# Patient Record
Sex: Female | Born: 1975
Health system: Southern US, Community
[De-identification: ages and names within clinical notes are randomized; demographics above are authoritative.]

## PROBLEM LIST (undated history)

## (undated) DIAGNOSIS — D649 Anemia, unspecified: Secondary | ICD-10-CM

## (undated) DIAGNOSIS — G35 Multiple sclerosis: Secondary | ICD-10-CM

## (undated) DIAGNOSIS — R519 Headache, unspecified: Secondary | ICD-10-CM

## (undated) DIAGNOSIS — G709 Myoneural disorder, unspecified: Secondary | ICD-10-CM

## (undated) DIAGNOSIS — H539 Unspecified visual disturbance: Secondary | ICD-10-CM

## (undated) DIAGNOSIS — R51 Headache: Secondary | ICD-10-CM

## (undated) DIAGNOSIS — F32A Depression, unspecified: Secondary | ICD-10-CM

## (undated) DIAGNOSIS — Z9889 Other specified postprocedural states: Secondary | ICD-10-CM

## (undated) DIAGNOSIS — R112 Nausea with vomiting, unspecified: Secondary | ICD-10-CM

## (undated) DIAGNOSIS — E119 Type 2 diabetes mellitus without complications: Secondary | ICD-10-CM

## (undated) DIAGNOSIS — G35D Multiple sclerosis, unspecified: Secondary | ICD-10-CM

## (undated) DIAGNOSIS — F329 Major depressive disorder, single episode, unspecified: Secondary | ICD-10-CM

## (undated) DIAGNOSIS — K219 Gastro-esophageal reflux disease without esophagitis: Secondary | ICD-10-CM

## (undated) DIAGNOSIS — G629 Polyneuropathy, unspecified: Secondary | ICD-10-CM

## (undated) DIAGNOSIS — I10 Essential (primary) hypertension: Secondary | ICD-10-CM

## (undated) HISTORY — PX: TONSILLECTOMY: SUR1361

## (undated) HISTORY — PX: CHOLECYSTECTOMY: SHX55

## (undated) HISTORY — DX: Multiple sclerosis: G35

## (undated) HISTORY — PX: TUBAL LIGATION: SHX77

## (undated) HISTORY — DX: Essential (primary) hypertension: I10

## (undated) HISTORY — DX: Polyneuropathy, unspecified: G62.9

## (undated) HISTORY — DX: Headache, unspecified: R51.9

## (undated) HISTORY — DX: Headache: R51

## (undated) HISTORY — DX: Type 2 diabetes mellitus without complications: E11.9

## (undated) HISTORY — PX: OTHER SURGICAL HISTORY: SHX169

## (undated) HISTORY — DX: Multiple sclerosis, unspecified: G35.D

## (undated) HISTORY — PX: DENTAL SURGERY: SHX609

## (undated) HISTORY — PX: ABDOMINAL HYSTERECTOMY: SHX81

## (undated) HISTORY — DX: Unspecified visual disturbance: H53.9

---

## 1998-09-08 ENCOUNTER — Encounter: Admission: RE | Admit: 1998-09-08 | Discharge: 1998-12-07 | Payer: Self-pay | Admitting: Obstetrics & Gynecology

## 2000-03-29 ENCOUNTER — Emergency Department (HOSPITAL_COMMUNITY): Admission: EM | Admit: 2000-03-29 | Discharge: 2000-03-29 | Payer: Self-pay

## 2010-10-29 ENCOUNTER — Emergency Department (HOSPITAL_COMMUNITY): Admission: EM | Admit: 2010-10-29 | Discharge: 2010-10-29 | Payer: Self-pay | Admitting: Emergency Medicine

## 2011-03-07 LAB — CBC
MCH: 27.1 pg (ref 26.0–34.0)
MCHC: 32.5 g/dL (ref 30.0–36.0)
Platelets: 184 10*3/uL (ref 150–400)
RBC: 4.09 MIL/uL (ref 3.87–5.11)

## 2011-03-07 LAB — POCT I-STAT, CHEM 8
HCT: 36 % (ref 36.0–46.0)
Hemoglobin: 12.2 g/dL (ref 12.0–15.0)
Potassium: 3.2 mEq/L — ABNORMAL LOW (ref 3.5–5.1)
Sodium: 141 mEq/L (ref 135–145)

## 2011-03-07 LAB — DIFFERENTIAL
Basophils Relative: 0 % (ref 0–1)
Eosinophils Absolute: 0 10*3/uL (ref 0.0–0.7)
Neutrophils Relative %: 52 % (ref 43–77)

## 2011-03-07 LAB — GLUCOSE, CAPILLARY: Glucose-Capillary: 171 mg/dL — ABNORMAL HIGH (ref 70–99)

## 2013-05-31 DIAGNOSIS — R209 Unspecified disturbances of skin sensation: Secondary | ICD-10-CM | POA: Insufficient documentation

## 2013-05-31 DIAGNOSIS — N899 Noninflammatory disorder of vagina, unspecified: Secondary | ICD-10-CM | POA: Insufficient documentation

## 2013-05-31 DIAGNOSIS — K089 Disorder of teeth and supporting structures, unspecified: Secondary | ICD-10-CM | POA: Insufficient documentation

## 2013-05-31 DIAGNOSIS — G609 Hereditary and idiopathic neuropathy, unspecified: Secondary | ICD-10-CM | POA: Insufficient documentation

## 2013-05-31 DIAGNOSIS — R059 Cough, unspecified: Secondary | ICD-10-CM | POA: Insufficient documentation

## 2013-05-31 DIAGNOSIS — J209 Acute bronchitis, unspecified: Secondary | ICD-10-CM | POA: Insufficient documentation

## 2013-05-31 DIAGNOSIS — R55 Syncope and collapse: Secondary | ICD-10-CM | POA: Insufficient documentation

## 2013-05-31 DIAGNOSIS — M779 Enthesopathy, unspecified: Secondary | ICD-10-CM | POA: Insufficient documentation

## 2013-05-31 DIAGNOSIS — T783XXA Angioneurotic edema, initial encounter: Secondary | ICD-10-CM | POA: Insufficient documentation

## 2013-05-31 DIAGNOSIS — R112 Nausea with vomiting, unspecified: Secondary | ICD-10-CM | POA: Insufficient documentation

## 2015-01-07 ENCOUNTER — Encounter: Payer: Self-pay | Admitting: Neurology

## 2015-01-07 ENCOUNTER — Ambulatory Visit (INDEPENDENT_AMBULATORY_CARE_PROVIDER_SITE_OTHER): Payer: Medicaid Other | Admitting: Neurology

## 2015-01-07 VITALS — BP 148/96 | Ht 72.0 in | Wt 253.0 lb

## 2015-01-07 DIAGNOSIS — R5382 Chronic fatigue, unspecified: Secondary | ICD-10-CM

## 2015-01-07 DIAGNOSIS — F331 Major depressive disorder, recurrent, moderate: Secondary | ICD-10-CM | POA: Insufficient documentation

## 2015-01-07 DIAGNOSIS — R269 Unspecified abnormalities of gait and mobility: Secondary | ICD-10-CM

## 2015-01-07 DIAGNOSIS — M5416 Radiculopathy, lumbar region: Secondary | ICD-10-CM

## 2015-01-07 DIAGNOSIS — G9332 Myalgic encephalomyelitis/chronic fatigue syndrome: Secondary | ICD-10-CM | POA: Insufficient documentation

## 2015-01-07 DIAGNOSIS — E119 Type 2 diabetes mellitus without complications: Secondary | ICD-10-CM | POA: Insufficient documentation

## 2015-01-07 DIAGNOSIS — G35 Multiple sclerosis: Secondary | ICD-10-CM

## 2015-01-07 DIAGNOSIS — D8989 Other specified disorders involving the immune mechanism, not elsewhere classified: Secondary | ICD-10-CM | POA: Insufficient documentation

## 2015-01-07 NOTE — Progress Notes (Addendum)
[facility]  PATIENT: Angela Solomon DOB: 1976-11-08  REFERRING CLINICIAN: Dr. Rita Ohara  HISTORY FROM: patient REASON FOR VISIT: MS   HISTORICAL  CHIEF COMPLAINT:  Chief Complaint  Patient presents with  . Multiple Sclerosis    HISTORY OF PRESENT ILLNESS:  Angela Solomon is a 39 year old woman with relapsing remitting multiple sclerosis.   She presented in 2003 with gait ataxia, slurred speech and right sided paresthesias. Imaging studies were performed and she was diagnosed with multiple sclerosis. Initially, she was placed on Rebif but had skin reactions and switched to Copaxone. On Copaxone, she had an immediate hypersensitivity response and it was discontinued. She has been on Avonex most of the time but has been off for months at a time when she has had insurance difficulties. She is currently taking Avonex and is able to get it without difficulty  She has many symptoms from her MS. She has gait ataxia and mild right greater than left leg weakness. She also has dysesthesias, fatigue and insomnia. She has depression. Unrelated to the MS, she also has low back and leg pain due to a herniated disc towards the left at L4-L5. 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  She has difficulty with her gait. Her right leg has more clumsiness and some weakness. She also has some altered sensation. She follows a few times every week. When she works, as she needs to assist others, she is unable to use a walker.  She has painful dysesthesias going down her right leg.  She has a lot of difficulty with chronic fatigue from the MS. This usually worsens as the day goes on. She reports both physical and cognitive fatigue. He also notes some depression discussed that may be contributing.    She was having a lot of insomnia but that improved after taking Valium at bedtime.  She has right greater than left lower back pain down the right leg. Her MRI  actually shows that a herniated disc at L4-L5 is more to the left but there is still some lateral recess narrowing on the right that could affect the right L5 nerve root. This pain is worse when she moves around. She gets some benefit from hydrocodone.  She has worked as a Magazine features editor for many years. However, this is becoming extremely difficult for her to do and she has had to miss many days of work. Specifically she has difficulties with her gait and is unable to help patients physically the way that she used to. Furthermore she has some cognitive issues and severe fatigue and that prevents her from working full shifts. These issues have been going on for several years but have continued to progress.  I personally reviewed office notes from [facility] and reports and images (when available) of: 1. MRI of the cervical spine dated 05/20/2014. It showed enhancing spinal cord lesions consistent with MS. C2-C3 there was a left lateral focus. Also at C2-C3 there was a posterior focus. At C3-C4 there was a right posterior lateral focus. At C4 there was a dorsal focus and at C5 there was another dorsal focus. 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 There are multilevel degenerative changes most pronounced at C7-T1 where there could be dynamic impingement of the right C8 nerve root.  2. MRI of the brain dated 09/28/2013 showing diffuse white matter foci in the periventricular and deep white matter consistent with [place].  Although there were no enhancing  foci, when compared to a previous MRI dated 10/11/2010, there was some progression.  3. An MRI of the lumbar spine dated 02/02/2012 showed a large disc extrusion towards the left at L4-L5 that could lead to left L4 and left L5 nerve root compression. 4. MRI of the brain with and without contrast dated 10/16/2008 showed enhancing lesions.  She was reportedly off medication at that time.   REVIEW OF SYSTEMS:  Constitutional: No fevers, chills,  sweats, or change in appetite Eyes: No visual changes, double vision, eye pain Ear, nose and throat: No hearing loss, ear pain, nasal congestion, sore throat Cardiovascular: No chest pain, palpitations Respiratory:  No shortness of breath at rest or with exertion.   No wheezes GastrointestinaI: No nausea, vomiting, diarrhea, abdominal pain, fecal incontinence Genitourinary:  No dysuria, urinary retention or frequency.  No nocturia. Musculoskeletal:  No neck pain, back pain Integumentary: No rash, pruritus, skin lesions Neurological: as above Psychiatric: No depression at this time.  No anxiety Endocrine: No palpitations, diaphoresis, change in appetite, change in weigh or increased thirst Hematologic/Lymphatic:  No anemia, purpura, petechiae. Allergic/Immunologic: No itchy/runny eyes, nasal congestion, recent allergic reactions, rashes  ALLERGIES: Allergies  Allergen Reactions  . Glatiramer Anaphylaxis  . Lisinopril Anaphylaxis  . Aspirin Nausea And Vomiting    Fevers and GI upset  . Hydrocodone-Acetaminophen Nausea And Vomiting    Pt states can take Hydrocodone but has a reaction to a additive in vicodin    HOME MEDICATIONS: No outpatient prescriptions prior to visit.   No facility-administered medications prior to visit.    PAST MEDICAL HISTORY: Past Medical History  Diagnosis Date  . Multiple sclerosis   . Diabetes mellitus without complication   . Headache   . Hypertension   . Neuropathy   . Vision abnormalities     PAST SURGICAL HISTORY: Past Surgical History  Procedure Laterality Date  . Tonsillectomy    . Cholecystectomy    . Dental surgery      FAMILY HISTORY: Family History  Problem Relation Age of Onset  . Hypertension Mother   . Diabetes Mother   . Stroke Mother   . Heart attack Mother   . Peripheral vascular disease Mother     SOCIAL HISTORY:  History   Social History  . Marital Status: Single    Spouse Name: N/A    Number of Children:  N/A  . Years of Education: N/A   Occupational History  . Not on file.   Social History Main Topics  . Smoking status: Current Every Day Smoker -- 0.50 packs/day    Types: Cigarettes  . Smokeless tobacco: Not on file  . Alcohol Use: 0.0 oz/week    0 Not specified per week     Comment: occasional  . Drug Use: No  . Sexual Activity: Not on file   Other Topics Concern  . Not on file   Social History Narrative  . No narrative on file     PHYSICAL EXAM  Filed Vitals:   01/07/15 0913  BP: 148/96  Height: 6' (1.829 m)  Weight: 253 lb (114.76 kg)    Body mass index is 34.31 kg/(m^2).   General: The patient is well-developed and well-nourished and in no acute distress  Eyes:  Funduscopic exam shows normal optic discs and retinal vessels.  Neck/back: The neck is supple, no carotid bruits are noted.  The neck is mildly tender.  Lower back is tender more to the right with restricted ROM  Respiratory: The  respiratory examination is clear.  Cardiovascular: The cardiovascular examination reveals a regular rate and rhythm, no murmurs, gallops or rubs are noted.  Skin: Extremities are without significant edema.     Neurologic Exam  Mental status: The patient is alert and oriented x 3 at the time of the examination. The patient has apparent normal recent and remote memory, with an apparently normal attention span and concentration ability.   Speech is normal.  Cranial nerves: Extraocular movements are full. Pupils are equal, round, and reactive to light and accomodation.  Visual fields are full.  Facial symmetry is present. There is good facial sensation to soft touch bilaterally.Facial strength is normal.  Trapezius and sternocleidomastoid strength is normal. No dysarthria is noted.  The tongue is midline, and the patient has symmetric elevation of the soft palate. No obvious hearing deficits are noted.  Motor:  Muscle bulk and tone are normal. Strength is  5 / 5 in the arms but  she has 4/5 left EHL and 4+/5 right EHL strength  Sensory: Sensory testing shpws decreased touch on the left and decreased vibration in right leg.      Coordination: Cerebellar testing reveals good finger-nose-finger but poor heel-to-shin bilaterally.  Gait and station: Station is wide.   Positive Romberg.   Wide gait and tandem is very poor.   Reflexes: Deep tendon reflexes are symmetric and 1 -2 bilaterally. Plantar responses are normal.    DIAGNOSTIC DATA (LABS, IMAGING, TESTING) - I reviewed patient records, labs, notes, testing and imaging myself where available.  Lab Results  Component Value Date   WBC 7.4 WHITE COUNT CONFIRMED ON SMEAR 10/29/2010   HGB 12.2 10/29/2010   HCT 36.0 10/29/2010   MCV 83.6 10/29/2010   PLT 184 PLATELET COUNT CONFIRMED BY SMEAR 10/29/2010      Component Value Date/Time   NA 141 10/29/2010 0251   K 3.2* 10/29/2010 0251   CL 105 10/29/2010 0251   GLUCOSE 184* 10/29/2010 0251   BUN 8 10/29/2010 0251   CREATININE 0.6 10/29/2010 0251   No results found for: CHOL, HDL, LDLCALC, LDLDIRECT, TRIG, CHOLHDL No results found for: HGBA1C No results found for: VITAMINB12 No results found for: TSH No components found for: VITAMIND     ASSESSMENT AND PLAN  In summary, Artina Minella is a 39 year old woman with multiple sclerosis since 2003 has had progressive gait difficulties over the past few years.  She has had some difficulty with touching her medications over the years. She usually appears stable if she stays on her Avonex. Exacerbations have occurred at least twice when she was off her medications. She will continue to take the medication and call us as soon as possible if she has an interruption in her supply.   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  Her fatigue only worsens the matter. She will continue on her medications.  Later this year we will check another MRI of the brain with and without contrast to ensure that there has not been progression of disease. If that has occurred, we would need to consider a change in therapy. She is advised to use her walker for balance.  If low back pain worsens, consider an epidural steroid injection.  She will return to see me in 3-4 months, sooner if she has new or worsening symptoms.   Payzlee Ryder A. Felecia Shelling, MD, PhD  3/42/8768, 1:15 AM Certified in Neurology, Clinical Neurophysiology, Sleep Medicine, Pain Medicine and Neuroimaging  [facility] [address], [place], [place] [address] [phone]

## 2015-01-07 NOTE — Patient Instructions (Signed)

## 2015-02-01 ENCOUNTER — Other Ambulatory Visit: Payer: Self-pay | Admitting: Neurology

## 2015-02-01 NOTE — Telephone Encounter (Addendum)
Pt is calling wanting a Rx that was given to her for the pain in her mouth.  She does not remember the medication but she thinks it may be Esomeprazole magnesium 40 mg. The medicine he gave her controls the nerves in her mouth where she does not have teeth.  She would like for Faith to give her a call back.

## 2015-02-01 NOTE — Telephone Encounter (Signed)
Did she leave an up to date #?  None of the #'s in her chart work/fim

## 2015-02-09 NOTE — Telephone Encounter (Signed)
Patient works 3rd shift and wants to be called for pick-up at a reasonable time.

## 2015-02-10 ENCOUNTER — Telehealth: Payer: Self-pay | Admitting: Neurology

## 2015-02-10 ENCOUNTER — Other Ambulatory Visit: Payer: Self-pay | Admitting: Neurology

## 2015-02-10 DIAGNOSIS — G35 Multiple sclerosis: Secondary | ICD-10-CM

## 2015-02-10 MED ORDER — HYDROCODONE-ACETAMINOPHEN 10-325 MG PO TABS: 1.0000 | ORAL_TABLET | Freq: Three times a day (TID) | ORAL | Status: DC | PRN

## 2015-02-10 NOTE — Telephone Encounter (Signed)
Hydrocodone rx. printed, signed, up front [facility] for pt. to pick up.  She is aware and will pick it up when the office opens tomorrow/fim

## 2015-02-10 NOTE — Telephone Encounter (Signed)
Patient requesting refill of hydrocodone 10/325. Best number to reach her is [phone].

## 2015-02-10 NOTE — Telephone Encounter (Signed)
Request entered, forwarded to provider for approval.  

## 2015-02-10 NOTE — Telephone Encounter (Signed)
Spoke with Laible has mult issues, which are more chronic/progressive in nature, such as worsening gait/balance, pain in legs.  Sts. she is also  having dental issus and is going to have to have all of her upper teeth removed.  Will eval her back for her reg f/u appt.  She requested r/f of Hydrocodone.  It is due, so it was printed, signed, and is up front [place] for her to pick up when the office opens tomorrow am/fim

## 2015-02-10 NOTE — Telephone Encounter (Signed)
Patient came to the office today complaining of pain in her lower right leg. The pain has increased. When she bends over, cough or sneezes it feels like her pelvis shifts. She has a burning feeling and muscles are very tight. She states that she has been falling a lot.  She would like a call back. Her best number is  [phone].

## 2015-02-11 ENCOUNTER — Telehealth: Payer: Self-pay | Admitting: Neurology

## 2015-02-11 NOTE — Telephone Encounter (Signed)
I spoke with Angela Solomon has decided to file for disability.  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  Dr. Felecia Shelling has spoken with her about this in the past and is agreeable that she is truly disabled.  I advised Angela Solomon to have disability paperwork faxed to our office fax # [phone], and Dr. Felecia Shelling will complete this for her.  She verbalized understanding of same/fim

## 2015-02-11 NOTE — Telephone Encounter (Signed)
Angela Solomon picked up her rx this am./fim

## 2015-02-11 NOTE — Telephone Encounter (Signed)
Patient states that she worked last night and does not feel that she can do it any longer. She would like an earlier apt if she can. Her best call back is [phone].

## 2015-03-03 ENCOUNTER — Telehealth: Payer: Self-pay | Admitting: Neurology

## 2015-03-03 ENCOUNTER — Other Ambulatory Visit: Payer: Self-pay | Admitting: *Deleted

## 2015-03-03 MED ORDER — DIAZEPAM 10 MG PO TABS: 10.0000 mg | ORAL_TABLET | Freq: Every evening | ORAL | Status: DC | PRN

## 2015-03-03 NOTE — Telephone Encounter (Signed)
Per note, Karson is taking Valium qhs prn.  Rx. printed, signed, in [place] office to go up front for Jaretssi to pick up/fim

## 2015-03-03 NOTE — Telephone Encounter (Signed)
Patient is calling to get a written Rx Diazepam 10 mg. Please leave message on her phone.

## 2015-03-08 ENCOUNTER — Other Ambulatory Visit: Payer: Self-pay | Admitting: Neurology

## 2015-03-08 ENCOUNTER — Telehealth: Payer: Self-pay

## 2015-03-08 DIAGNOSIS — G35 Multiple sclerosis: Secondary | ICD-10-CM

## 2015-03-08 MED ORDER — HYDROCODONE-ACETAMINOPHEN 10-325 MG PO TABS: 1.0000 | ORAL_TABLET | Freq: Three times a day (TID) | ORAL | Status: DC | PRN

## 2015-03-08 NOTE — Telephone Encounter (Signed)
Patient requesting refill for Rx HYDROcodone-acetaminophen (NORCO) 10-325 MG per tablet.  Please call when ready for pick up. °

## 2015-03-08 NOTE — Telephone Encounter (Signed)
Called patient and informed Rx ready for pick up at front desk. Patient verbalized understanding.  

## 2015-03-18 ENCOUNTER — Telehealth: Payer: Self-pay | Admitting: Neurology

## 2015-03-18 DIAGNOSIS — M545 Low back pain: Secondary | ICD-10-CM

## 2015-03-18 DIAGNOSIS — G8929 Other chronic pain: Secondary | ICD-10-CM | POA: Insufficient documentation

## 2015-03-18 MED ORDER — METHYLPREDNISOLONE (PAK) 4 MG PO TABS: ORAL_TABLET | ORAL | Status: DC

## 2015-03-18 NOTE — Telephone Encounter (Signed)
LMTC./fim 

## 2015-03-18 NOTE — Telephone Encounter (Signed)
Patient was called back and she stated that she had recent lower back pain which worsening for the last several days and she was not able to stand up. She was so painful and she needs steroids to help her. She is stated that she is a patient with of Dr. Felecia Shelling for 13 years, and she was used to be prescribed of Medrol Dosepak for lower back pain. Dr. Felecia Shelling saw her 01/07/2015 and stated in his note that if lower back pain worsens, he suggest epidural steroid injection. However patient stated that she had last epidural injection was when she had her baby and she would not allow anybody to stick her in her back. Will prescribed a Medrol Dosepak to her pharmacy.  She also had UTI which was recently diagnosed and she was put on antibiotics. She had already taken anybody for the last 8 days and there is 2 more days to go. Her symptoms much improved. I informed her that steroids may worsen the bacterial infection and if there's any worsening of UTI symptoms she needs to stop steroids and go to a nearby emergency room. She expressed unstanding and appreciation.  Rosalin Hawking, MD PhD Stroke Neurology 03/18/2015 1:16 PM

## 2015-03-18 NOTE — Telephone Encounter (Signed)
Patient is calling to get a Rx called in for Prednisone. Patient states the pain in her lower right side of her back is now chronic and the patient cannot stand up. Please call and advise. [facility]  [address] in [place]. Patient also states she went to the ER on 3-22 and was diagnosed with a chronic UTI and was given an antibiotic. Please call patient to discuss.Thank you.

## 2015-03-22 NOTE — Telephone Encounter (Signed)
Spoke with Angela Solomon who sts. right sided lbp is no better, but that she just started Medrol dose pk this am.  In light of her recent tx. for uti, we reviewed sx. of uti (dark urine, strong odor, dysuria) and she is aware if she has any of these sx., she should follow up with her pcp.  She will call back next week if back pain is not improving or is worse/fim

## 2015-03-28 ENCOUNTER — Telehealth: Payer: Self-pay | Admitting: Neurology

## 2015-03-28 NOTE — Telephone Encounter (Signed)
She called in more pain.   I called in baclofen up to 10 mg po qid  If not better, she will call us back tomorrow to be worked in.

## 2015-03-29 ENCOUNTER — Telehealth: Payer: Self-pay | Admitting: Neurology

## 2015-03-29 NOTE — Telephone Encounter (Signed)
Noted.  Will discuss at f/u appt. tomorrow/fim

## 2015-03-29 NOTE — Telephone Encounter (Addendum)
Patient is calling back in regard to Rx Baclofen you prescribed for her yesterday. She states drug is not helping her and is coming in tomorrow 4/5 for appt.  Thanks!

## 2015-03-30 ENCOUNTER — Encounter: Payer: Self-pay | Admitting: Neurology

## 2015-03-30 ENCOUNTER — Telehealth: Payer: Self-pay | Admitting: Neurology

## 2015-03-30 ENCOUNTER — Ambulatory Visit (INDEPENDENT_AMBULATORY_CARE_PROVIDER_SITE_OTHER): Payer: Medicaid Other | Admitting: Neurology

## 2015-03-30 VITALS — BP 142/94 | HR 80 | Resp 16 | Ht 72.0 in | Wt 253.0 lb

## 2015-03-30 DIAGNOSIS — M5441 Lumbago with sciatica, right side: Secondary | ICD-10-CM

## 2015-03-30 DIAGNOSIS — G35 Multiple sclerosis: Secondary | ICD-10-CM | POA: Diagnosis not present

## 2015-03-30 DIAGNOSIS — M5442 Lumbago with sciatica, left side: Secondary | ICD-10-CM

## 2015-03-30 DIAGNOSIS — M5416 Radiculopathy, lumbar region: Secondary | ICD-10-CM

## 2015-03-30 MED ORDER — PROMETHAZINE HCL 25 MG PO TABS: 25.0000 mg | ORAL_TABLET | Freq: Four times a day (QID) | ORAL | Status: DC | PRN

## 2015-03-30 NOTE — Progress Notes (Signed)
[facility]  PATIENT: Angela Solomon DOB: 11-30-1976  REFERRING CLINICIAN: Dr. Rita Ohara  HISTORY FROM: patient REASON FOR VISIT: MS   HISTORICAL  CHIEF COMPLAINT:  Chief Complaint  Patient presents with  . Back Pain    Sts. is having lbp radiating  down posterior aspect of both legs to feet.  Sts. also has twitching  sensation lateral right calf, like someone is tapping her leg.  Sts. minimal relief with Baclofen./fim    HISTORY OF PRESENT ILLNESS:  Angela Solomon is a 39 year old woman with relapsing remitting multiple sclerosis who reports much worse right back pain that spread to both sides and now is radiating down both legs, down the calf and into the top of both feet.   Pain increases with standing and walking.   Sitting leaning forward is a little better.      A Medrol dose pack and baclofen has  helped some  (helps her in bed but not when she is standing).   An MRI of the lumbar spine dated 02/02/2012 showed a large disc extrusion towards the left at L4-L5 that could lead to left L4 and left L5 nerve root compression.   Her MRI shows that the herniated disc at L4-L5 is more to the left but there is still some lateral recess narrowing on the right that could affect the right L5 nerve root.    She had a bad experience with an ESI in the past and does not want another one.    In the ER last week, she had a UTI and receive PCN.   She is now better.    Unfortunately, she has a yeast infection since the Abx for the UTI (she is a diabetic since age 70)   MS:   She presented in 2003 with gait ataxia, slurred speech and right sided paresthesias. Imaging studies were performed and she was diagnosed with multiple sclerosis. Initially, she was placed on Rebif but had skin reactions and switched to Copaxone. On Copaxone, she had an immediate hypersensitivity response and it was discontinued. She has been on Avonex most of the time but has been off for months at a time when  she has had insurance difficulties. She is currently taking Avonex and is able to get it without difficulty.   She has many symptoms from her MS. She has gait ataxia and mild right greater than left leg weakness. She also has dysesthesias, fatigue and insomnia. She has depression.   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  She has difficulty with her gait. Her right leg has more clumsiness and some weakness. She also has some altered sensation. She follows a few times every week. When she works, as she needs to assist others, she is unable to use a walker.   She has worked as a Magazine features editor for many years. However, this is becoming extremely difficult for her to do and she has had to miss many days of work. Specifically she has difficulties with her gait and is unable to help patients physically the way that she used to. Furthermore she has some cognitive issues and severe fatigue and that prevents her from working full shifts.   Currnetly, with her lumbar radic's, she cannot work  These issues have been going on for several years but have continued to progress.  Latest MRI's: 1. MRI of the cervical spine dated 05/20/2014. It showed enhancing spinal cord lesions consistent with MS. C2-C3 there was  a left lateral focus. Also at C2-C3 there was a posterior focus. At C3-C4 there was a right posterior lateral focus. At C4 there was a dorsal focus and at C5 there was another dorsal focus. 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 There are multilevel degenerative changes most pronounced at C7-T1 where there could be dynamic impingement of the right C8 nerve root.  2. MRI of the brain dated 09/28/2013 showing diffuse white matter foci in the periventricular and deep white matter consistent with [place].  Although there were no enhancing foci, when compared to a previous MRI dated 10/11/2010, there was some progression.  3. An MRI of the lumbar spine dated 02/02/2012 showed a  large disc extrusion towards the left at L4-L5 that could lead to left L4 and left L5 nerve root compression. 4. MRI of the brain with and without contrast dated 10/16/2008 showed enhancing lesions.  She was reportedly off medication at that time.   REVIEW OF SYSTEMS:  Constitutional: No fevers, chills, sweats, or change in appetite.   Fatigue, poor sleep Eyes: No visual changes, double vision, eye pain Ear, nose and throat: No hearing loss, ear pain, nasal congestion, sore throat Cardiovascular: No chest pain, palpitations Respiratory:  No shortness of breath at rest or with exertion.   No wheezes GastrointestinaI: No nausea, vomiting, diarrhea, abdominal pain, fecal incontinence Genitourinary:  No dysuria, urinary retention or frequency.  No nocturia. Musculoskeletal:  as above. Integumentary: No rash, pruritus, skin lesions Neurological: as above Psychiatric: No depression at this time.  No anxiety Endocrine: No palpitations, diaphoresis, change in appetite, change in weigh or increased thirst Hematologic/Lymphatic:  No anemia, purpura, petechiae. Allergic/Immunologic: No itchy/runny eyes, nasal congestion, recent allergic reactions, rashes  ALLERGIES: Allergies  Allergen Reactions  . Glatiramer Anaphylaxis  . Lisinopril Anaphylaxis  . Aspirin Nausea And Vomiting    Fevers and GI upset  . Hydrocodone-Acetaminophen Nausea And Vomiting    Pt states can take Hydrocodone but has a reaction to a additive in vicodin  . Zithromax [Azithromycin] Rash    HOME MEDICATIONS: Outpatient Prescriptions Prior to Visit  Medication Sig Dispense Refill  . amitriptyline (ELAVIL) 25 MG tablet Take by mouth at bedtime.  5  . B-D INS SYR ULTRAFINE 1CC/31G 31G X 5/16" 1 ML MISC See admin instructions.  0  . carvedilol (COREG) 25 MG tablet Take 25 mg by mouth.    . cyclobenzaprine (FLEXERIL) 10 MG tablet Take 10 mg by mouth.    . diazepam (VALIUM) 10 MG tablet Take 1 tablet (10 mg total) by mouth at  bedtime as needed for anxiety. 30 tablet 2  . ferrous sulfate 325 (65 FE) MG tablet Take 325 mg by mouth.    . fluticasone (FLONASE) 50 MCG/ACT nasal spray 2 sprays by Each Nare route daily.    Marland Kitchen gabapentin (NEURONTIN) 400 MG capsule Take 800 mg by mouth.    Marland Kitchen HYDROcodone-acetaminophen (NORCO) 10-325 MG per tablet Take 1 tablet by mouth 3 (three) times daily as needed. 90 tablet 0  . insulin NPH-regular Human (NOVOLIN 70/30) (70-30) 100 UNIT/ML injection Inject into the skin.    Marland Kitchen LEVEMIR 100 UNIT/ML injection   5  . losartan-hydrochlorothiazide (HYZAAR) 100-25 MG per tablet Take by mouth.    Marland Kitchen NOVOLIN R 100 UNIT/ML injection See admin instructions. per sliding scale  0  . pantoprazole (PROTONIX) 40 MG tablet Take 40 mg by mouth.    . promethazine (PHENERGAN) 25 MG tablet Take 25 mg by  mouth.    . citalopram (CELEXA) 20 MG tablet Take 20 mg by mouth.    . fluconazole (DIFLUCAN) 200 MG tablet   1  . methylPREDNIsolone (MEDROL DOSPACK) 4 MG tablet follow package directions (Patient not taking: Reported on 03/30/2015) 21 tablet 0  . Sodium Fluoride 1.1 % PSTE Brush normally. Rinse. Apply small pea-size of Prevident and brush all surfaces. Spit. Do not rinse/drink for 92min.     No facility-administered medications prior to visit.    PAST MEDICAL HISTORY: Past Medical History  Diagnosis Date  . Multiple sclerosis   . Diabetes mellitus without complication   . Headache   . Hypertension   . Neuropathy   . Vision abnormalities     PAST SURGICAL HISTORY: Past Surgical History  Procedure Laterality Date  . Tonsillectomy    . Cholecystectomy    . Dental surgery      FAMILY HISTORY: Family History  Problem Relation Age of Onset  . Hypertension Mother   . Diabetes Mother   . Stroke Mother   . Heart attack Mother   . Peripheral vascular disease Mother     SOCIAL HISTORY:  History   Social History  . Marital Status: Single    Spouse Name: N/A  . Number of Children: N/A  .  Years of Education: N/A   Occupational History  . Not on file.   Social History Main Topics  . Smoking status: Current Every Day Smoker -- 0.50 packs/day    Types: Cigarettes  . Smokeless tobacco: Not on file  . Alcohol Use: 0.0 oz/week    0 Standard drinks or equivalent per week     Comment: occasional  . Drug Use: No  . Sexual Activity: Not on file   Other Topics Concern  . Not on file   Social History Narrative     PHYSICAL EXAM  Filed Vitals:   03/30/15 0936  BP: 142/94  Pulse: 80  Resp: 16  Height: 6' (1.829 m)  Weight: 253 lb (114.76 kg)    Body mass index is 34.31 kg/(m^2).   General: The patient is well-developed and well-nourished and in no acute distress  Neck/back:  The neck is mildly tender.  Lower back is tender more to the right in lower lumbar paraspinals with restricted ROM  Neurologic Exam  Mental status: The patient is alert and oriented x 3 at the time of the examination. The patient has apparent normal recent and remote memory, with an apparently normal attention span and concentration ability.   Speech is normal.  Cranial nerves: Extraocular movements are full. Facial symmetry is present. There is good facial sensation to soft touch bilaterally.Facial strength is normal.  Trapezius and sternocleidomastoid strength is normal. No dysarthria is noted.   Motor:  Muscle bulk and tone are normal. Strength is  5 / 5 in the arms but she has 3/5 left EHL and 4/5 right EHL strength  Sensory: Sensory testing shpws decreased touch on the left leg (L5 worse than S1 dermatome) and decreased vibration in right leg.      Coordination: Cerebellar testing reveals good finger-nose-finger but poor heel-to-shin bilaterally.  Gait and station: Station is wide.   Positive Romberg.   Wide gait and tandem is very poor.   Reflexes: Deep tendon reflexes are symmetric and 1 bilaterally in legs.    DIAGNOSTIC DATA (LABS, IMAGING, TESTING) - I reviewed patient records,  labs, notes, testing and imaging myself where available.     ASSESSMENT AND  PLAN  Lumbar radicular pain - Plan: MR Lumbar Spine Wo Contrast, Ambulatory referral to Physical Therapy  Multiple sclerosis  Bilateral low back pain with sciatica, sciatica laterality unspecified   1.    Check Lumbar-spine MRI and consider ESI or referral to surgery depending on results 2.    Physical therapy for radiculopathy 3.    TPI 80 mg Depo-Medrol in Marcaine using sterile technique -- pain was 20-30 % better afterwards 4.    Continue med's 5.    rtc 3 months if better or sooner if not better. Richard A. Felecia Shelling, MD, PhD 01/27/3434, 68:61 AM Certified in Neurology, Clinical Neurophysiology, Sleep Medicine, Pain Medicine and Neuroimaging  [facility] [address] [place], [place] [address] [phone]

## 2015-03-30 NOTE — Telephone Encounter (Signed)
LMOM for Angela Solomon--[facility], but she should contact Dr. Shan Levans office for Fluconazole, as he rx's this for her./fim

## 2015-03-30 NOTE — Telephone Encounter (Signed)
Pt is calling requesting a refill on fluconazole (DIFLUCAN) 200 MG tablet and promethazine (PHENERGAN) 25 MG tablet. [facility] [place] [facility] [address].  Please call and advise.

## 2015-03-31 ENCOUNTER — Telehealth: Payer: Self-pay | Admitting: *Deleted

## 2015-03-31 NOTE — Telephone Encounter (Signed)
Release fax to [facility] requesting records on 04/09/15.

## 2015-04-02 ENCOUNTER — Telehealth: Payer: Self-pay | Admitting: Neurology

## 2015-04-02 DIAGNOSIS — G35 Multiple sclerosis: Secondary | ICD-10-CM

## 2015-04-02 MED ORDER — HYDROCODONE-ACETAMINOPHEN 10-325 MG PO TABS: 1.0000 | ORAL_TABLET | Freq: Three times a day (TID) | ORAL | Status: DC | PRN

## 2015-04-02 NOTE — Telephone Encounter (Signed)
Hydrocodone rx. postdated for 04-07-15 due date, printed, signed by [name], up front [facility] to be picked up.  I spoke with Angela Solomon and let her know rx. ready/fim

## 2015-04-02 NOTE — Telephone Encounter (Signed)
Patient is calling to get a written Rx for HYDROcodone-acetaminophen (NORCO) 10-325 MG per tablet. Please call patient when ready for pickup. Thank you.

## 2015-04-06 ENCOUNTER — Telehealth: Payer: Self-pay | Admitting: Neurology

## 2015-04-08 ENCOUNTER — Telehealth: Payer: Self-pay | Admitting: Neurology

## 2015-04-08 MED ORDER — CYCLOBENZAPRINE HCL 10 MG PO TABS: 10.0000 mg | ORAL_TABLET | Freq: Three times a day (TID) | ORAL | Status: DC | PRN

## 2015-04-08 NOTE — Telephone Encounter (Signed)
Patient is calling to get a refill called in for cyclobenzaprine (FLEXERIL) 10 MG tablet [facility] [address] [facility] [facility] and wants to discuss this medication with Faith  ASAP before 5pm. Thank you.

## 2015-04-12 ENCOUNTER — Telehealth: Payer: Self-pay | Admitting: Neurology

## 2015-04-12 NOTE — Telephone Encounter (Signed)
Patient is calling to let you know she is hurting,  can't walk, her balance is off and she has fallen 7 times from Saturday morning until now.  She needs an extended leave for her work.  Please fax letter to [phone]. Also her MRI will be May 3rd. She feels she needs help and would like for you to call her please.  Thanks!

## 2015-04-12 NOTE — Telephone Encounter (Signed)
I spoke with Angela Solomon and advised generally fmla paperwork is required for extended absences from work.  I advised she may have her employer fax me the paperwork, or she can pick it up and bring it to me--whichever is most convenient for her.  She verbalized understanding of same/fim

## 2015-04-14 ENCOUNTER — Encounter: Payer: Self-pay | Admitting: Neurology

## 2015-04-14 NOTE — Telephone Encounter (Signed)
Pt is calling back stating where she works at is a private place and they do not have FMLA paperwork.  She states Dr. Felecia Shelling can write a letter stating she is under his care.  She is still on her walker 100%.  She needs this letter to be faxed to [phone] attn:  Godfrey Pick..  She can give you more details when you call her back to advise. Pt also states she does not feel that Dr. Felecia Shelling is getting all of her messages, I did inform the pt that her message were documented and I read it back to her and she verbalized understanding.  Please call and advise.

## 2015-04-14 NOTE — Telephone Encounter (Signed)
Attempted to contact Teela--received message that vm is full; I am unable to leave a message/fim

## 2015-04-14 NOTE — Telephone Encounter (Signed)
Ill be happy to write a letter and fax to Ms. Britt ---   How much time off does she thinks she needs so I can include in letter.   Can MRI be moved up sooner?

## 2015-04-15 ENCOUNTER — Encounter: Payer: Self-pay | Admitting: Neurology

## 2015-04-15 NOTE — Telephone Encounter (Signed)
Printed and signed.   Please fax to number on letter

## 2015-04-15 NOTE — Telephone Encounter (Signed)
Patient is very angry because she needs the [facility] faxed to [phone] and has been waiting. I told her that Faith RN tried to call her back and her voicemail was full and she requested that we try both her cell and her home number the next time we call. She stated that she is about to lose her job because she has been waiting for this paperwork. Please call and advise. DW

## 2015-04-15 NOTE — Telephone Encounter (Signed)
I have faxed letter to Godfrey Pick, at [phone].  I spoke with Megha and let her know this  has been done.  She verbalized understanding of same, and stated she was sorry for becoming angry in phone conversations with me and with Gaynell.  Sts. she has been under more stress due to husband's health problems./fim

## 2015-04-20 ENCOUNTER — Telehealth: Payer: Self-pay | Admitting: Neurology

## 2015-04-20 ENCOUNTER — Encounter: Payer: Self-pay | Admitting: *Deleted

## 2015-04-20 NOTE — Telephone Encounter (Signed)
Patient returned call, I told her that you were going to fax the information over today and she has stated that she has additional questions regarding her medication. Please call and advise.

## 2015-04-20 NOTE — Telephone Encounter (Signed)
Pt is calling stating that she is having a problem getting a refill on cyclobenzaprine (FLEXERIL) 10 MG tablet because of the baclofen which she is not using anymore. She states she is moving around a lot better now and she wants to go back to work.  So she needs a letter to go back to work on May 1st. You can fax the letter to  [phone]. Please call and advise.

## 2015-04-20 NOTE — Telephone Encounter (Signed)
Left message for Angela Solomon that I will fax letter allowing her to return to work on May 1, to her employer today.  She does not need to return this call unless she has questions/fim

## 2015-04-20 NOTE — Telephone Encounter (Signed)
Spoke with Lanaysia who sts. she is having difficulty getting flexeril rx. from the pharmacy.  They do have rx. with refills there--she isn't sure what the problem is exactly.  She will speak with them and call me back or have the pharmacy call me if they need anything from me/fim

## 2015-04-27 ENCOUNTER — Ambulatory Visit
Admission: RE | Admit: 2015-04-27 | Discharge: 2015-04-27 | Disposition: A | Discharge status: Home / Self Care | Payer: Medicaid Other | Source: Ambulatory Visit | Attending: Neurology | Admitting: Neurology

## 2015-04-27 ENCOUNTER — Encounter (INDEPENDENT_AMBULATORY_CARE_PROVIDER_SITE_OTHER): Payer: Medicaid Other | Admitting: Neurology

## 2015-04-27 DIAGNOSIS — M5416 Radiculopathy, lumbar region: Secondary | ICD-10-CM

## 2015-04-28 ENCOUNTER — Telehealth: Payer: Self-pay | Admitting: *Deleted

## 2015-04-28 NOTE — Telephone Encounter (Signed)
#   busy mult. times today.  I need to speak with Angela Solomon--[name] has read the mri of her back.  She has a disc herniation with a sequestered disc fragment, and should see NS for this.  If she is agreeable, I will order NS referral/fim

## 2015-04-28 NOTE — Telephone Encounter (Signed)
Need to speak with Angela Solomon--[name] has read the mri of her L-S spine.  She has a large  herniated disc with sequestered disc fragment--needs referral to NS/fim

## 2015-04-29 ENCOUNTER — Encounter: Payer: Self-pay | Admitting: *Deleted

## 2015-04-29 NOTE — Telephone Encounter (Signed)
I attempted to contact Falynn again--have been told at work # that there is nobody there by that name.  Mobile/home # I received a message that "at the subscriber's request, this number does not accept incoming calls/fim

## 2015-04-29 NOTE — Telephone Encounter (Signed)
-----   Message from Britt Bottom, MD sent at 04/28/2015 10:56 AM EDT ----- She has a herniated disc at L4L5 woth a sequestered disc fragment compressing several nerve roots.   I need her to see Neurosurgery as medications or injections are unlikely to help enough

## 2015-04-29 NOTE — Telephone Encounter (Signed)
See other 04-28-15 task.  I have made mult. attempts to reach Angela Solomon.  Work # says there is nobody there by that name.  Home # has a message that "at the subscriber's reqeust, this number does not accept incoming calls.  I have mailed an unable to contact letter today, asking her to call me about mri results/fim

## 2015-05-04 ENCOUNTER — Telehealth: Payer: Self-pay | Admitting: Neurology

## 2015-05-04 DIAGNOSIS — G35 Multiple sclerosis: Secondary | ICD-10-CM

## 2015-05-04 MED ORDER — HYDROCODONE-ACETAMINOPHEN 10-325 MG PO TABS: 1.0000 | ORAL_TABLET | Freq: Three times a day (TID) | ORAL | Status: DC | PRN

## 2015-05-04 NOTE — Telephone Encounter (Signed)
Patient called and request a refill on Rx. HYDROcodone-acetaminophen (NORCO) 10-325 MG per tablet. Please call and advise.

## 2015-05-04 NOTE — Telephone Encounter (Signed)
I have spoken with Angela Solomon and advised Hydrocodone rx. postdated for due date of 05-07-15, printed, signed, up front [facility].  She verbalized understanding of same/fim

## 2015-05-05 ENCOUNTER — Other Ambulatory Visit: Payer: Self-pay | Admitting: *Deleted

## 2015-05-05 DIAGNOSIS — M5432 Sciatica, left side: Secondary | ICD-10-CM

## 2015-05-05 DIAGNOSIS — M5416 Radiculopathy, lumbar region: Secondary | ICD-10-CM

## 2015-05-05 DIAGNOSIS — M5441 Lumbago with sciatica, right side: Secondary | ICD-10-CM

## 2015-05-05 NOTE — Telephone Encounter (Signed)
I have spoken with Angela Solomon and per [name], advised her of mri report as below.  She verbalized understanding of same and is agreeable to seeing NS.  I have ordered referral/fim

## 2015-05-05 NOTE — Telephone Encounter (Signed)
-----   Message from Britt Bottom, MD sent at 04/28/2015 10:56 AM EDT ----- She has a herniated disc at L4L5 woth a sequestered disc fragment compressing several nerve roots.   I need her to see Neurosurgery as medications or injections are unlikely to help enough

## 2015-05-12 ENCOUNTER — Telehealth: Payer: Self-pay | Admitting: Neurology

## 2015-05-12 ENCOUNTER — Telehealth: Payer: Self-pay | Admitting: Nurse Practitioner

## 2015-05-12 NOTE — Telephone Encounter (Signed)
LMVM to inform patient that Gerald Stabs from [facility] is attempting to contact her to schedule. The number to reach Gerald Stabs back is [phone]

## 2015-05-17 ENCOUNTER — Telehealth: Payer: Self-pay | Admitting: Neurology

## 2015-05-17 ENCOUNTER — Telehealth: Payer: Self-pay

## 2015-05-17 MED ORDER — INTERFERON BETA-1A 30 MCG IM KIT: 30.0000 ug | PACK | INTRAMUSCULAR | Status: DC

## 2015-05-17 NOTE — Telephone Encounter (Signed)
Angela Solomon with [facility]- [phone]. He is inquiring about form interferon beta-1a (AVONEX) 30 MCG injection should dispensed. It had been written for a pen. Last script did not state form of medication. Please call and advise.

## 2015-05-17 NOTE — Telephone Encounter (Signed)
I have spoken with pharmacist at [facility] and advised pt. can have syringe or pens, whichever she prefers/fim

## 2015-05-17 NOTE — Telephone Encounter (Signed)
[facility] is requesting a refill on Avonex 53mcg Pen, one injection IM weekly.  Thank you.

## 2015-05-17 NOTE — Telephone Encounter (Signed)
Avonex rx. escribed to [facility] per request/fim

## 2015-05-20 ENCOUNTER — Telehealth: Payer: Self-pay | Admitting: Neurology

## 2015-05-20 DIAGNOSIS — M545 Low back pain: Secondary | ICD-10-CM

## 2015-05-20 MED ORDER — METHYLPREDNISOLONE 4 MG PO TBPK: ORAL_TABLET | ORAL | Status: DC

## 2015-05-20 NOTE — Telephone Encounter (Signed)
The patient called and requested to speak with Faith RN regarding a Rx. For PREDNISONE. She would like to know if she can start PREDNISONE because she is experiencing severe swelling in her wrist, left foot, right leg and both knees. Please call and advise. [phone])

## 2015-05-20 NOTE — Telephone Encounter (Signed)
Per [name], ok for Medrol dose pk.  I lmom for Allsion that I am [facility] [address] [facility] for her.  She does not need to return this call unless she has questions/fim

## 2015-05-21 NOTE — Telephone Encounter (Signed)
[facility] called stating that [facility] [address] [facility] her script in. Please resend the script. Please call and advise. Patient can be reached @ [phone]

## 2015-05-21 NOTE — Telephone Encounter (Signed)
[facility] this morning--pharmacist there sts. they did not received Medrol dose pk that was escribed yesterday.  I gave vo for Medrol 4mg  #21 with 0 refills, take 6 tabs on day 1 and decrease by 1 daily until gone.  I have spoken with Reve and advised that [facility] now definitely has rx/fim

## 2015-06-01 ENCOUNTER — Other Ambulatory Visit: Payer: Self-pay | Admitting: Neurology

## 2015-06-01 MED ORDER — CYCLOBENZAPRINE HCL 10 MG PO TABS: 10.0000 mg | ORAL_TABLET | Freq: Three times a day (TID) | ORAL | Status: DC | PRN

## 2015-06-01 MED ORDER — DIAZEPAM 10 MG PO TABS: 10.0000 mg | ORAL_TABLET | Freq: Every evening | ORAL | Status: DC | PRN

## 2015-06-01 NOTE — Telephone Encounter (Signed)
Request entered, forwarded to provider for approval.  

## 2015-06-01 NOTE — Telephone Encounter (Signed)
Patient requesting refill for cyclobenzaprine (FLEXERIL) 10 MG tablet and diazepam (VALIUM) 10 MG tablet [facility] on [address]. Patient can be reached at [phone].

## 2015-06-03 ENCOUNTER — Other Ambulatory Visit: Payer: Self-pay | Admitting: Neurosurgery

## 2015-06-03 ENCOUNTER — Other Ambulatory Visit: Payer: Self-pay | Admitting: Neurology

## 2015-06-03 DIAGNOSIS — G35 Multiple sclerosis: Secondary | ICD-10-CM

## 2015-06-03 MED ORDER — HYDROCODONE-ACETAMINOPHEN 10-325 MG PO TABS: 1.0000 | ORAL_TABLET | Freq: Three times a day (TID) | ORAL | Status: DC | PRN

## 2015-06-03 NOTE — Telephone Encounter (Signed)
Patient called requesting a refill for HYDROcodone-acetaminophen (NORCO) 10-325 MG per tablet. Patient was advised the script wont be ready until 24 hrs unless she is advised otherwise by the nurse. Patient stated she will pick it up Monday.

## 2015-06-03 NOTE — Telephone Encounter (Signed)
Request entered, forwarded to provider for approval.  

## 2015-06-03 NOTE — Telephone Encounter (Signed)
Error

## 2015-06-22 ENCOUNTER — Encounter (HOSPITAL_COMMUNITY)
Admission: RE | Admit: 2015-06-22 | Discharge: 2015-06-22 | Disposition: A | Discharge status: Home / Self Care | Payer: Medicaid Other | Source: Ambulatory Visit | Attending: Neurosurgery | Admitting: Neurosurgery

## 2015-06-22 ENCOUNTER — Encounter (HOSPITAL_COMMUNITY): Payer: Self-pay

## 2015-06-22 DIAGNOSIS — R9431 Abnormal electrocardiogram [ECG] [EKG]: Secondary | ICD-10-CM | POA: Diagnosis not present

## 2015-06-22 DIAGNOSIS — M4800 Spinal stenosis, site unspecified: Secondary | ICD-10-CM | POA: Diagnosis not present

## 2015-06-22 DIAGNOSIS — E1040 Type 1 diabetes mellitus with diabetic neuropathy, unspecified: Secondary | ICD-10-CM | POA: Insufficient documentation

## 2015-06-22 DIAGNOSIS — Z79899 Other long term (current) drug therapy: Secondary | ICD-10-CM | POA: Insufficient documentation

## 2015-06-22 DIAGNOSIS — Z01812 Encounter for preprocedural laboratory examination: Secondary | ICD-10-CM | POA: Insufficient documentation

## 2015-06-22 DIAGNOSIS — K219 Gastro-esophageal reflux disease without esophagitis: Secondary | ICD-10-CM | POA: Diagnosis not present

## 2015-06-22 DIAGNOSIS — I10 Essential (primary) hypertension: Secondary | ICD-10-CM | POA: Diagnosis not present

## 2015-06-22 DIAGNOSIS — Z01818 Encounter for other preprocedural examination: Secondary | ICD-10-CM | POA: Insufficient documentation

## 2015-06-22 DIAGNOSIS — G35 Multiple sclerosis: Secondary | ICD-10-CM | POA: Diagnosis not present

## 2015-06-22 HISTORY — DX: Other specified postprocedural states: R11.2

## 2015-06-22 HISTORY — DX: Anemia, unspecified: D64.9

## 2015-06-22 HISTORY — DX: Major depressive disorder, single episode, unspecified: F32.9

## 2015-06-22 HISTORY — DX: Other specified postprocedural states: Z98.890

## 2015-06-22 HISTORY — DX: Myoneural disorder, unspecified: G70.9

## 2015-06-22 HISTORY — DX: Gastro-esophageal reflux disease without esophagitis: K21.9

## 2015-06-22 HISTORY — DX: Depression, unspecified: F32.A

## 2015-06-22 LAB — COMPREHENSIVE METABOLIC PANEL
ALK PHOS: 76 U/L (ref 38–126)
ALT: 15 U/L (ref 14–54)
AST: 13 U/L — ABNORMAL LOW (ref 15–41)
Albumin: 3.7 g/dL (ref 3.5–5.0)
Anion gap: 10 (ref 5–15)
BUN: 11 mg/dL (ref 6–20)
CO2: 27 mmol/L (ref 22–32)
Calcium: 9.9 mg/dL (ref 8.9–10.3)
Chloride: 96 mmol/L — ABNORMAL LOW (ref 101–111)
Creatinine, Ser: 0.84 mg/dL (ref 0.44–1.00)
GLUCOSE: 455 mg/dL — AB (ref 65–99)
POTASSIUM: 4.1 mmol/L (ref 3.5–5.1)
Sodium: 133 mmol/L — ABNORMAL LOW (ref 135–145)
Total Bilirubin: 0.4 mg/dL (ref 0.3–1.2)
Total Protein: 7.3 g/dL (ref 6.5–8.1)

## 2015-06-22 LAB — SURGICAL PCR SCREEN
MRSA, PCR: NEGATIVE
Staphylococcus aureus: NEGATIVE

## 2015-06-22 LAB — CBC
HCT: 36.2 % (ref 36.0–46.0)
Hemoglobin: 12.7 g/dL (ref 12.0–15.0)
MCH: 30.3 pg (ref 26.0–34.0)
MCHC: 35.1 g/dL (ref 30.0–36.0)
MCV: 86.4 fL (ref 78.0–100.0)
PLATELETS: 258 10*3/uL (ref 150–400)
RBC: 4.19 MIL/uL (ref 3.87–5.11)
RDW: 12.2 % (ref 11.5–15.5)
WBC: 6.7 10*3/uL (ref 4.0–10.5)

## 2015-06-22 LAB — GLUCOSE, CAPILLARY: Glucose-Capillary: 401 mg/dL — ABNORMAL HIGH (ref 65–99)

## 2015-06-22 NOTE — Pre-Procedure Instructions (Signed)
Angela Solomon  06/22/2015      [id number] [place] [address] AT [address] [place] [address] Phone: [phone] Fax: [phone]  [facility] - [place], [place] [address] [place] [address] Phone: [phone] Fax: [phone]  [id number] - [place], [place] - [address] [place] [address] Phone: [phone] Fax: [phone]    Your procedure is scheduled on : Tuesday, June 5th   Report to [facility] Admitting at 5:30 AM  Call this number if you have problems the morning of surgery:  [phone]   Remember:  Do not eat food or drink liquids after midnight  Monday.  Take these medicines the morning of surgery with A SIP OF WATER : Cardedilol, Celexa, Valium, Hydrocodone, Protonix.           Please take your insulin as directed.   Do not wear jewelry, make-up or nail polish.  Do not wear lotions, powders, or perfumes.  You may NOT wear deodorant the day of surgery.  Do not shave underarms & legs 48 hours prior to surgery.     Do not bring valuables to the hospital.  [facility] is not responsible for any belongings or valuables. Contacts, dentures or bridgework may not be worn into surgery.  Leave your suitcase in the car.  After surgery it may be brought to your room. For patients admitted to the hospital, discharge time will be determined by your treatment team.   Name and phone number of your driver:     Special instructions: "Preparing for Surgery" instruction sheet.  Please read over the following fact sheets that you were given. Pain Booklet, Coughing and Deep Breathing, MRSA Information and Surgical Site Infection Prevention

## 2015-06-22 NOTE — Progress Notes (Signed)
Patient is Type 1 diabetic.  Also has Ms. Today blood sugar at PAT  Was 401.  Pt stated she forgot to take her insulin this am.  She has no endocrinologist.  Dr. Rita Ohara follows her diabetes. Her insulin scale is as follows.  She takes 55 units of levemir in the AM, 70 units in the evening and a sliding scale coverage.  I have instructed, per the new diabetes PILOT-- to take 56 units of levemir the NIGHT before and 44 units in the AM.  Sliding scale should be cut in half if sugar is greater then 220 and she understands.  I have notified Durel Salts, NP and A. Zelenak, PA of above issues.

## 2015-06-23 LAB — HEMOGLOBIN A1C
HEMOGLOBIN A1C: 12.2 % — AB (ref 4.8–5.6)
Mean Plasma Glucose: 303 mg/dL

## 2015-06-23 NOTE — Progress Notes (Addendum)
Anesthesia Chart Review:  Patient is a 39 year old female scheduled for bilateral L4-5 laminectomy with discectomy and Coflex on 06/29/15 by Dr. Alric Seton. She is posted as a first case.   History includes DM1 (diagnosed at age 68), neuropathy, smoking, post-operative N/V, Multiple Sclerosis, HTN, GERD, anemia, depression, tonsillectomy, cholecystectomy, full dental restoration 01/05/15. BMI is consistent with obesity.  She reported PCP as Dr. Rita Ohara. She does not see an endocrinologist.   Meds include albuterol, baclofen, Coreg, Celexa, Flexeril, Valium, 65 Fe, Flonase, Neurontin, Norco, Levemir 55 Units AM and 70 Units PM, Novolin SSI, Avonex, Hyzaar, promethizine, Protonix.  06/22/15 EKG: NSR, possible LAE, septal infarct (age undetermined). There are no comparison EKGs in Epic, Muse, or at her PCP office.    Preoperative labs noted. CBG on arrival was 401. With other labs, glucose 455. Anion gap 10. She reported that she forgot to take her insulin this morning.  Says home glucose typically runs 160-200, although A1C is elevated at 12.2 which correlates with an eAG of 303. Na 133, K 4.1, Cr 0.84. CBC WNL.   Her significant hyperglycemia at PAT was likely influenced by her missing her morning insulin; however, her A1C shows a trend of poorly controlled DM1. Her insulin compliance could be an issue. If her DM is not better controlled prior to surgery then I think there is a high likelihood that her surgery would be canceled.  I have relayed this to Dominica at Dr. Sande Rives office, and will forward patient's labs and EKG to her.  She will contact Dr. Shan Levans office for review and preoperative recommendations.  [facility] Short Stay Center/Anesthesiology Phone [phone] 06/23/2015 10:57 AM  Addendum: Received an update from Dominica. She spoke with patient and staff at Dr. Shan Levans office. She faxed PAT results to Dr. Lajoyce Corners for review. Patient was to follow-up with Dr. Lajoyce Corners for any  additional recommendations in hopes to get her glucose better controlled prior to surgery. Plans to proceed will depend on CBG results on the day of surgery.    [facility] Short Stay Center/Anesthesiology Phone [phone] 06/25/2015 4:47 PM

## 2015-06-28 MED ORDER — CEFAZOLIN SODIUM-DEXTROSE 2-3 GM-% IV SOLR
2.0000 g | INTRAVENOUS | Status: AC
  Administered 2015-06-29: 2 g via INTRAVENOUS
  Filled 2015-06-28: qty 50

## 2015-06-28 MED ORDER — DEXAMETHASONE SODIUM PHOSPHATE 10 MG/ML IJ SOLN
10.0000 mg | INTRAMUSCULAR | Status: AC
  Administered 2015-06-29: 10 mg via INTRAVENOUS
  Filled 2015-06-28: qty 1

## 2015-06-28 MED ORDER — CEFAZOLIN SODIUM-DEXTROSE 2-3 GM-% IV SOLR: 2.0000 g | INTRAVENOUS | Status: DC

## 2015-06-29 ENCOUNTER — Inpatient Hospital Stay (HOSPITAL_COMMUNITY)
Admission: RE | Admit: 2015-06-29 | Discharge: 2015-06-29 | DRG: 520 | Disposition: A | Discharge status: Home / Self Care | Payer: Medicaid Other | Source: Ambulatory Visit | Attending: Neurosurgery | Admitting: Neurosurgery

## 2015-06-29 ENCOUNTER — Inpatient Hospital Stay (HOSPITAL_COMMUNITY): Payer: Medicaid Other

## 2015-06-29 ENCOUNTER — Encounter (HOSPITAL_COMMUNITY)
Admission: RE | Disposition: A | Discharge status: Home / Self Care | Payer: Self-pay | Source: Ambulatory Visit | Attending: Neurosurgery

## 2015-06-29 ENCOUNTER — Encounter (HOSPITAL_COMMUNITY): Payer: Self-pay | Admitting: *Deleted

## 2015-06-29 ENCOUNTER — Inpatient Hospital Stay (HOSPITAL_COMMUNITY): Payer: Medicaid Other | Admitting: Anesthesiology

## 2015-06-29 ENCOUNTER — Inpatient Hospital Stay (HOSPITAL_COMMUNITY): Payer: Medicaid Other | Admitting: Emergency Medicine

## 2015-06-29 DIAGNOSIS — M549 Dorsalgia, unspecified: Secondary | ICD-10-CM | POA: Diagnosis present

## 2015-06-29 DIAGNOSIS — M4806 Spinal stenosis, lumbar region: Secondary | ICD-10-CM | POA: Diagnosis present

## 2015-06-29 DIAGNOSIS — F329 Major depressive disorder, single episode, unspecified: Secondary | ICD-10-CM | POA: Diagnosis present

## 2015-06-29 DIAGNOSIS — M5126 Other intervertebral disc displacement, lumbar region: Secondary | ICD-10-CM | POA: Diagnosis present

## 2015-06-29 DIAGNOSIS — Z720 Tobacco use: Secondary | ICD-10-CM | POA: Diagnosis not present

## 2015-06-29 DIAGNOSIS — J449 Chronic obstructive pulmonary disease, unspecified: Secondary | ICD-10-CM | POA: Diagnosis present

## 2015-06-29 DIAGNOSIS — K219 Gastro-esophageal reflux disease without esophagitis: Secondary | ICD-10-CM | POA: Diagnosis present

## 2015-06-29 DIAGNOSIS — I10 Essential (primary) hypertension: Secondary | ICD-10-CM | POA: Diagnosis present

## 2015-06-29 DIAGNOSIS — E119 Type 2 diabetes mellitus without complications: Secondary | ICD-10-CM | POA: Diagnosis present

## 2015-06-29 DIAGNOSIS — M48061 Spinal stenosis, lumbar region without neurogenic claudication: Secondary | ICD-10-CM | POA: Diagnosis present

## 2015-06-29 HISTORY — PX: LUMBAR LAMINECTOMY WITH COFLEX 1 LEVEL: SHX6514

## 2015-06-29 LAB — GLUCOSE, CAPILLARY
Glucose-Capillary: 253 mg/dL — ABNORMAL HIGH (ref 65–99)
Glucose-Capillary: 177 mg/dL — ABNORMAL HIGH (ref 65–99)
Glucose-Capillary: 154 mg/dL — ABNORMAL HIGH (ref 65–99)
Glucose-Capillary: 237 mg/dL — ABNORMAL HIGH (ref 65–99)

## 2015-06-29 LAB — HCG, SERUM, QUALITATIVE: Preg, Serum: NEGATIVE

## 2015-06-29 SURGERY — LUMBAR LAMINECTOMY WITH COFLEX 1 LEVEL
Anesthesia: General | Site: Back | Laterality: Bilateral

## 2015-06-29 MED ORDER — HYDROMORPHONE HCL 1 MG/ML IJ SOLN: 1.0000 mg | INTRAMUSCULAR | Status: DC | PRN

## 2015-06-29 MED ORDER — 0.9 % SODIUM CHLORIDE (POUR BTL) OPTIME
TOPICAL | Status: DC | PRN
  Administered 2015-06-29: 1000 mL

## 2015-06-29 MED ORDER — LIDOCAINE HCL (CARDIAC) 20 MG/ML IV SOLN
INTRAVENOUS | Status: AC
  Filled 2015-06-29: qty 5

## 2015-06-29 MED ORDER — LOSARTAN POTASSIUM 50 MG PO TABS
100.0000 mg | ORAL_TABLET | Freq: Every day | ORAL | Status: DC
  Administered 2015-06-29: 100 mg via ORAL
  Filled 2015-06-29: qty 2

## 2015-06-29 MED ORDER — ACETAMINOPHEN 650 MG RE SUPP: 650.0000 mg | RECTAL | Status: DC | PRN

## 2015-06-29 MED ORDER — PROPOFOL 10 MG/ML IV BOLUS
INTRAVENOUS | Status: DC | PRN
  Administered 2015-06-29: 200 mg via INTRAVENOUS

## 2015-06-29 MED ORDER — MIDAZOLAM HCL 5 MG/5ML IJ SOLN
INTRAMUSCULAR | Status: DC | PRN
  Administered 2015-06-29: 2 mg via INTRAVENOUS

## 2015-06-29 MED ORDER — METHOCARBAMOL 500 MG PO TABS
500.0000 mg | ORAL_TABLET | Freq: Four times a day (QID) | ORAL | Status: DC | PRN
  Administered 2015-06-29: 500 mg via ORAL
  Filled 2015-06-29: qty 1

## 2015-06-29 MED ORDER — BACLOFEN 10 MG PO TABS
10.0000 mg | ORAL_TABLET | Freq: Four times a day (QID) | ORAL | Status: DC | PRN
  Filled 2015-06-29: qty 1

## 2015-06-29 MED ORDER — SUCCINYLCHOLINE CHLORIDE 20 MG/ML IJ SOLN
INTRAMUSCULAR | Status: AC
  Filled 2015-06-29: qty 1

## 2015-06-29 MED ORDER — CEFAZOLIN SODIUM 1-5 GM-% IV SOLN
1.0000 g | Freq: Three times a day (TID) | INTRAVENOUS | Status: DC
  Administered 2015-06-29: 1 g via INTRAVENOUS
  Filled 2015-06-29 (×2): qty 50

## 2015-06-29 MED ORDER — GLYCOPYRROLATE 0.2 MG/ML IJ SOLN
INTRAMUSCULAR | Status: AC
  Filled 2015-06-29: qty 2

## 2015-06-29 MED ORDER — PANTOPRAZOLE SODIUM 40 MG IV SOLR
40.0000 mg | Freq: Every day | INTRAVENOUS | Status: DC
  Filled 2015-06-29: qty 40

## 2015-06-29 MED ORDER — PROMETHAZINE HCL 25 MG/ML IJ SOLN: 6.2500 mg | INTRAMUSCULAR | Status: DC | PRN

## 2015-06-29 MED ORDER — LACTATED RINGERS IV SOLN
INTRAVENOUS | Status: DC | PRN
  Administered 2015-06-29 (×2): via INTRAVENOUS

## 2015-06-29 MED ORDER — HYDROCODONE-ACETAMINOPHEN 10-325 MG PO TABS: 1.0000 | ORAL_TABLET | Freq: Four times a day (QID) | ORAL | Status: DC | PRN

## 2015-06-29 MED ORDER — THROMBIN 5000 UNITS EX SOLR
CUTANEOUS | Status: DC | PRN
  Administered 2015-06-29 (×2): 5000 [IU] via TOPICAL

## 2015-06-29 MED ORDER — MIDAZOLAM HCL 2 MG/2ML IJ SOLN: 0.5000 mg | Freq: Once | INTRAMUSCULAR | Status: DC | PRN

## 2015-06-29 MED ORDER — EPHEDRINE SULFATE 50 MG/ML IJ SOLN
INTRAMUSCULAR | Status: AC
  Filled 2015-06-29: qty 1

## 2015-06-29 MED ORDER — FENTANYL CITRATE (PF) 250 MCG/5ML IJ SOLN
INTRAMUSCULAR | Status: AC
  Filled 2015-06-29: qty 5

## 2015-06-29 MED ORDER — INSULIN DETEMIR 100 UNIT/ML ~~LOC~~ SOLN: 55.0000 [IU] | Freq: Every day | SUBCUTANEOUS | Status: DC

## 2015-06-29 MED ORDER — HYDROMORPHONE HCL 1 MG/ML IJ SOLN
0.2500 mg | INTRAMUSCULAR | Status: DC | PRN
Start: 2015-06-29 — End: 2015-06-29

## 2015-06-29 MED ORDER — INSULIN ASPART 100 UNIT/ML ~~LOC~~ SOLN
4.0000 [IU] | Freq: Three times a day (TID) | SUBCUTANEOUS | Status: DC
  Administered 2015-06-29: 4 [IU] via SUBCUTANEOUS

## 2015-06-29 MED ORDER — ONDANSETRON HCL 4 MG/2ML IJ SOLN
INTRAMUSCULAR | Status: AC
  Administered 2015-06-29: 4 mg
  Filled 2015-06-29: qty 2

## 2015-06-29 MED ORDER — ROCURONIUM BROMIDE 100 MG/10ML IV SOLN
INTRAVENOUS | Status: DC | PRN
  Administered 2015-06-29: 50 mg via INTRAVENOUS

## 2015-06-29 MED ORDER — CITALOPRAM HYDROBROMIDE 20 MG PO TABS
20.0000 mg | ORAL_TABLET | Freq: Every day | ORAL | Status: DC
  Administered 2015-06-29: 20 mg via ORAL
  Filled 2015-06-29: qty 1

## 2015-06-29 MED ORDER — ONDANSETRON HCL 4 MG/2ML IJ SOLN
INTRAMUSCULAR | Status: DC | PRN
  Administered 2015-06-29: 4 mg via INTRAVENOUS

## 2015-06-29 MED ORDER — ONDANSETRON HCL 4 MG/2ML IJ SOLN: 4.0000 mg | INTRAMUSCULAR | Status: DC | PRN

## 2015-06-29 MED ORDER — LIDOCAINE HCL (CARDIAC) 20 MG/ML IV SOLN
INTRAVENOUS | Status: DC | PRN
  Administered 2015-06-29: 80 mg via INTRAVENOUS
  Administered 2015-06-29: 20 mg via INTRAVENOUS

## 2015-06-29 MED ORDER — NEOSTIGMINE METHYLSULFATE 10 MG/10ML IV SOLN
INTRAVENOUS | Status: DC | PRN
  Administered 2015-06-29: 3 mg via INTRAVENOUS

## 2015-06-29 MED ORDER — MENTHOL 3 MG MT LOZG: 1.0000 | LOZENGE | OROMUCOSAL | Status: DC | PRN

## 2015-06-29 MED ORDER — POTASSIUM CHLORIDE IN NACL 20-0.45 MEQ/L-% IV SOLN
INTRAVENOUS | Status: DC
  Filled 2015-06-29 (×2): qty 1000

## 2015-06-29 MED ORDER — SODIUM CHLORIDE 0.9 % IR SOLN
Status: DC | PRN
  Administered 2015-06-29: 500 mL

## 2015-06-29 MED ORDER — CARVEDILOL 25 MG PO TABS
25.0000 mg | ORAL_TABLET | Freq: Two times a day (BID) | ORAL | Status: DC
  Filled 2015-06-29: qty 1

## 2015-06-29 MED ORDER — PROPOFOL 10 MG/ML IV BOLUS
INTRAVENOUS | Status: AC
  Filled 2015-06-29: qty 20

## 2015-06-29 MED ORDER — DOCUSATE SODIUM 100 MG PO CAPS: 100.0000 mg | ORAL_CAPSULE | Freq: Two times a day (BID) | ORAL | Status: DC

## 2015-06-29 MED ORDER — BISACODYL 5 MG PO TBEC
5.0000 mg | DELAYED_RELEASE_TABLET | Freq: Every day | ORAL | Status: DC | PRN
  Filled 2015-06-29: qty 1

## 2015-06-29 MED ORDER — AMITRIPTYLINE HCL 25 MG PO TABS
25.0000 mg | ORAL_TABLET | Freq: Every day | ORAL | Status: DC
  Filled 2015-06-29: qty 2

## 2015-06-29 MED ORDER — SODIUM CHLORIDE 0.9 % IJ SOLN: 3.0000 mL | INTRAMUSCULAR | Status: DC | PRN

## 2015-06-29 MED ORDER — ROCURONIUM BROMIDE 50 MG/5ML IV SOLN
INTRAVENOUS | Status: AC
  Filled 2015-06-29: qty 1

## 2015-06-29 MED ORDER — DEXTROSE 5 % IV SOLN
500.0000 mg | Freq: Four times a day (QID) | INTRAVENOUS | Status: DC | PRN
  Filled 2015-06-29: qty 5

## 2015-06-29 MED ORDER — ALBUTEROL SULFATE (2.5 MG/3ML) 0.083% IN NEBU: 3.0000 mL | INHALATION_SOLUTION | RESPIRATORY_TRACT | Status: DC | PRN

## 2015-06-29 MED ORDER — HEMOSTATIC AGENTS (NO CHARGE) OPTIME
TOPICAL | Status: DC | PRN
  Administered 2015-06-29: 1 via TOPICAL

## 2015-06-29 MED ORDER — LABETALOL HCL 5 MG/ML IV SOLN
INTRAVENOUS | Status: AC
  Filled 2015-06-29: qty 4

## 2015-06-29 MED ORDER — MIDAZOLAM HCL 2 MG/2ML IJ SOLN
INTRAMUSCULAR | Status: AC
  Filled 2015-06-29: qty 2

## 2015-06-29 MED ORDER — MEPERIDINE HCL 25 MG/ML IJ SOLN: 6.2500 mg | INTRAMUSCULAR | Status: DC | PRN

## 2015-06-29 MED ORDER — LOSARTAN POTASSIUM-HCTZ 100-25 MG PO TABS: 1.0000 | ORAL_TABLET | Freq: Every day | ORAL | Status: DC

## 2015-06-29 MED ORDER — FENTANYL CITRATE (PF) 100 MCG/2ML IJ SOLN
INTRAMUSCULAR | Status: DC | PRN
  Administered 2015-06-29: 200 ug via INTRAVENOUS
  Administered 2015-06-29: 50 ug via INTRAVENOUS
  Administered 2015-06-29: 100 ug via INTRAVENOUS

## 2015-06-29 MED ORDER — PHENOL 1.4 % MT LIQD: 1.0000 | OROMUCOSAL | Status: DC | PRN

## 2015-06-29 MED ORDER — GLYCOPYRROLATE 0.2 MG/ML IJ SOLN
INTRAMUSCULAR | Status: DC | PRN
  Administered 2015-06-29: .4 mg via INTRAVENOUS

## 2015-06-29 MED ORDER — SODIUM CHLORIDE 0.9 % IJ SOLN
3.0000 mL | Freq: Two times a day (BID) | INTRAMUSCULAR | Status: DC
  Administered 2015-06-29: 3 mL via INTRAVENOUS

## 2015-06-29 MED ORDER — INSULIN ASPART 100 UNIT/ML ~~LOC~~ SOLN
0.0000 [IU] | Freq: Three times a day (TID) | SUBCUTANEOUS | Status: DC
  Administered 2015-06-29: 5 [IU] via SUBCUTANEOUS

## 2015-06-29 MED ORDER — GABAPENTIN 400 MG PO CAPS
800.0000 mg | ORAL_CAPSULE | Freq: Four times a day (QID) | ORAL | Status: DC
  Administered 2015-06-29: 800 mg via ORAL
  Filled 2015-06-29 (×3): qty 2

## 2015-06-29 MED ORDER — SODIUM CHLORIDE 0.9 % IJ SOLN
INTRAMUSCULAR | Status: AC
  Filled 2015-06-29: qty 10

## 2015-06-29 MED ORDER — LABETALOL HCL 5 MG/ML IV SOLN
5.0000 mg | INTRAVENOUS | Status: DC | PRN
  Administered 2015-06-29: 5 mg via INTRAVENOUS

## 2015-06-29 MED ORDER — INSULIN DETEMIR 100 UNIT/ML ~~LOC~~ SOLN
70.0000 [IU] | Freq: Every day | SUBCUTANEOUS | Status: DC
  Filled 2015-06-29: qty 0.7

## 2015-06-29 MED ORDER — NEOSTIGMINE METHYLSULFATE 10 MG/10ML IV SOLN
INTRAVENOUS | Status: AC
  Filled 2015-06-29: qty 3

## 2015-06-29 MED ORDER — SCOPOLAMINE 1 MG/3DAYS TD PT72
MEDICATED_PATCH | TRANSDERMAL | Status: AC
  Administered 2015-06-29: 1 via TRANSDERMAL
  Filled 2015-06-29: qty 1

## 2015-06-29 MED ORDER — ONDANSETRON HCL 4 MG/2ML IJ SOLN
INTRAMUSCULAR | Status: AC
  Filled 2015-06-29: qty 2

## 2015-06-29 MED ORDER — HYDROCHLOROTHIAZIDE 25 MG PO TABS
25.0000 mg | ORAL_TABLET | Freq: Every day | ORAL | Status: DC
  Administered 2015-06-29: 25 mg via ORAL
  Filled 2015-06-29: qty 1

## 2015-06-29 MED ORDER — OXYCODONE-ACETAMINOPHEN 5-325 MG PO TABS
1.0000 | ORAL_TABLET | ORAL | Status: DC | PRN
  Administered 2015-06-29: 2 via ORAL
  Filled 2015-06-29: qty 2

## 2015-06-29 MED ORDER — INSULIN ASPART 100 UNIT/ML ~~LOC~~ SOLN: 0.0000 [IU] | Freq: Every day | SUBCUTANEOUS | Status: DC

## 2015-06-29 MED ORDER — ACETAMINOPHEN 325 MG PO TABS: 650.0000 mg | ORAL_TABLET | ORAL | Status: DC | PRN

## 2015-06-29 MED ORDER — FLUTICASONE PROPIONATE 50 MCG/ACT NA SUSP
2.0000 | NASAL | Status: DC | PRN
  Filled 2015-06-29: qty 16

## 2015-06-29 SURGICAL SUPPLY — 51 items
ADH SKN CLS LQ APL DERMABOND (GAUZE/BANDAGES/DRESSINGS) ×1
APL SKNCLS STERI-STRIP NONHPOA (GAUZE/BANDAGES/DRESSINGS) ×1
BAG DECANTER FOR FLEXI CONT (MISCELLANEOUS) ×3 IMPLANT
BENZOIN TINCTURE PRP APPL 2/3 (GAUZE/BANDAGES/DRESSINGS) ×4 IMPLANT
BLADE CLIPPER SURG (BLADE) ×3 IMPLANT
BRUSH SCRUB EZ PLAIN DRY (MISCELLANEOUS) ×3 IMPLANT
BUR CUTTER 7.0 ROUND (BURR) ×3 IMPLANT
BUR MATCHSTICK NEURO 3.0 LAGG (BURR) IMPLANT
CANISTER SUCT 3000ML PPV (MISCELLANEOUS) ×3 IMPLANT
CLOSURE WOUND 1/2 X4 (GAUZE/BANDAGES/DRESSINGS) ×1
CONT SPEC 4OZ CLIKSEAL STRL BL (MISCELLANEOUS) ×3 IMPLANT
DERMABOND ADHESIVE PROPEN (GAUZE/BANDAGES/DRESSINGS) ×2
DERMABOND ADVANCED .7 DNX6 (GAUZE/BANDAGES/DRESSINGS) IMPLANT
DEVICE COFLEX STABLIZATION 10M (Neuro Prosthesis/Implant) ×2 IMPLANT
DRAPE C-ARM 42X72 X-RAY (DRAPES) ×6 IMPLANT
DRAPE LAPAROTOMY 100X72X124 (DRAPES) ×3 IMPLANT
DRAPE MICROSCOPE LEICA (MISCELLANEOUS) ×2 IMPLANT
DRAPE SURG 17X23 STRL (DRAPES) ×6 IMPLANT
DRSG OPSITE POSTOP 4X6 (GAUZE/BANDAGES/DRESSINGS) ×3 IMPLANT
DURAPREP 26ML APPLICATOR (WOUND CARE) ×3 IMPLANT
ELECT REM PT RETURN 9FT ADLT (ELECTROSURGICAL) ×3
ELECTRODE REM PT RTRN 9FT ADLT (ELECTROSURGICAL) ×1 IMPLANT
GAUZE SPONGE 4X4 12PLY STRL (GAUZE/BANDAGES/DRESSINGS) ×3 IMPLANT
GAUZE SPONGE 4X4 16PLY XRAY LF (GAUZE/BANDAGES/DRESSINGS) IMPLANT
GLOVE ECLIPSE 8.0 STRL XLNG CF (GLOVE) ×3 IMPLANT
GOWN STRL REUS W/ TWL LRG LVL3 (GOWN DISPOSABLE) IMPLANT
GOWN STRL REUS W/ TWL XL LVL3 (GOWN DISPOSABLE) ×1 IMPLANT
GOWN STRL REUS W/TWL 2XL LVL3 (GOWN DISPOSABLE) IMPLANT
GOWN STRL REUS W/TWL LRG LVL3 (GOWN DISPOSABLE)
GOWN STRL REUS W/TWL XL LVL3 (GOWN DISPOSABLE) ×3
KIT BASIN OR (CUSTOM PROCEDURE TRAY) ×3 IMPLANT
KIT ROOM TURNOVER OR (KITS) ×3 IMPLANT
LIQUID BAND (GAUZE/BANDAGES/DRESSINGS) ×3 IMPLANT
NDL SPNL 22GX3.5 QUINCKE BK (NEEDLE) ×2 IMPLANT
NEEDLE HYPO 22GX1.5 SAFETY (NEEDLE) ×3 IMPLANT
NEEDLE SPNL 22GX3.5 QUINCKE BK (NEEDLE) ×6 IMPLANT
NS IRRIG 1000ML POUR BTL (IV SOLUTION) ×3 IMPLANT
PACK LAMINECTOMY NEURO (CUSTOM PROCEDURE TRAY) ×3 IMPLANT
PAD ARMBOARD 7.5X6 YLW CONV (MISCELLANEOUS) ×9 IMPLANT
PATTIES SURGICAL .75X.75 (GAUZE/BANDAGES/DRESSINGS) ×3 IMPLANT
RUBBERBAND STERILE (MISCELLANEOUS) ×4 IMPLANT
SPONGE SURGIFOAM ABS GEL SZ50 (HEMOSTASIS) ×3 IMPLANT
STRIP CLOSURE SKIN 1/2X4 (GAUZE/BANDAGES/DRESSINGS) ×2 IMPLANT
SUT PROLENE 0 CT 1 30 (SUTURE) IMPLANT
SUT VIC AB 2-0 OS6 18 (SUTURE) ×11 IMPLANT
SUT VIC AB 3-0 CP2 18 (SUTURE) ×3 IMPLANT
SYR 20ML ECCENTRIC (SYRINGE) ×3 IMPLANT
TAPE STRIPS DRAPE STRL (GAUZE/BANDAGES/DRESSINGS) ×2 IMPLANT
TOWEL OR 17X24 6PK STRL BLUE (TOWEL DISPOSABLE) ×3 IMPLANT
TOWEL OR 17X26 10 PK STRL BLUE (TOWEL DISPOSABLE) ×3 IMPLANT
WATER STERILE IRR 1000ML POUR (IV SOLUTION) ×3 IMPLANT

## 2015-06-29 NOTE — Anesthesia Postprocedure Evaluation (Signed)
  Anesthesia Post-op Note  Patient: Angela Solomon  Procedure(s) Performed: Procedure(s) with comments: LUMBAR LAMINECTOMY WITH COFLEX 1 LEVEL (Bilateral) - LUMBAR LAMINECTOMY WITH COFLEX 1 LEVEL  Patient Location: PACU  Anesthesia Type:General  Level of Consciousness: awake, alert , oriented and patient cooperative  Airway and Oxygen Therapy: Patient Spontanous Breathing and Patient connected to nasal cannula oxygen  Post-op Pain: mild  Post-op Assessment: Post-op Vital signs reviewed, Patient's Cardiovascular Status Stable, Respiratory Function Stable, Patent Airway, No signs of Nausea or vomiting and Pain level controlled              Post-op Vital Signs: Reviewed and stable  Last Vitals:  Filed Vitals:   06/29/15 1100  BP: 133/83  Pulse: 86  Temp: 36.5 C  Resp: 13    Complications: No apparent anesthesia complications

## 2015-06-29 NOTE — Discharge Summary (Signed)
Physician Discharge Summary  Patient ID: Angela Solomon MRN: [id number] DOB/AGE: 1976/11/18 39 y.o.  Admit date: 06/29/2015 Discharge date: 06/29/2015  Admission Diagnoses:  Discharge Diagnoses:  Active Problems:   Spinal stenosis, lumbar   Discharged Condition: good  Hospital Course: Surgery this morning for lumbar decompression with coflex. Did very well w marked improvement in her pain. Wound clean and dry. No new neuro issues. Home same day with specific instructions given.  Consults: None  Significant Diagnostic Studies: none  Treatments: surgery: bilateral; L 45 decompression with discectomy and coflex.  Discharge Exam: Blood pressure 142/77, pulse 93, temperature 97.7 F (36.5 C), temperature source Oral, resp. rate 18, weight 108.41 kg (239 lb), last menstrual period 06/29/2015, SpO2 100 %. Incision/Wound:clean and dry; no new neuro issues  Disposition: Final discharge disposition not confirmed     Medication List    ASK your doctor about these medications        acetaminophen 500 MG tablet  Commonly known as:  TYLENOL  Take 1,000 mg by mouth every 6 (six) hours as needed for mild pain or moderate pain.     albuterol 108 (90 BASE) MCG/ACT inhaler  Commonly known as:  PROVENTIL HFA;VENTOLIN HFA  Inhale 1 puff into the lungs as needed for wheezing or shortness of breath.     amitriptyline 25 MG tablet  Commonly known as:  ELAVIL  Take 25-50 mg by mouth at bedtime.     B-D INS SYR ULTRAFINE 1CC/31G 31G X 5/16" 1 ML Misc  Generic drug:  Insulin Syringe-Needle U-100  See admin instructions.     baclofen 10 MG tablet  Commonly known as:  LIORESAL  Take 10 mg by mouth 4 (four) times daily as needed for muscle spasms.     carvedilol 25 MG tablet  Commonly known as:  COREG  Take 25 mg by mouth 2 (two) times daily.     citalopram 20 MG tablet  Commonly known as:  CELEXA  Take 20 mg by mouth daily.     cyclobenzaprine 10 MG tablet  Commonly known as:   FLEXERIL  Take 1 tablet (10 mg total) by mouth 3 (three) times daily as needed for muscle spasms.     diazepam 10 MG tablet  Commonly known as:  VALIUM  Take 1 tablet (10 mg total) by mouth at bedtime as needed for anxiety.     ferrous sulfate 325 (65 FE) MG tablet  Take 325 mg by mouth daily.     fluticasone 50 MCG/ACT nasal spray  Commonly known as:  FLONASE  Place 2 sprays into both nostrils as needed for allergies.     gabapentin 400 MG capsule  Commonly known as:  NEURONTIN  Take 800 mg by mouth 4 (four) times daily.     HYDROcodone-acetaminophen 10-325 MG per tablet  Commonly known as:  NORCO  Take 1 tablet by mouth 3 (three) times daily as needed.     ibuprofen 200 MG tablet  Commonly known as:  ADVIL,MOTRIN  Take 400-800 mg by mouth every 6 (six) hours as needed for headache, mild pain or moderate pain.     interferon beta-1a 30 MCG injection  Commonly known as:  AVONEX  Inject 30 mcg into the muscle every 7 (seven) days.     LEVEMIR 100 UNIT/ML injection  Generic drug:  insulin detemir  Inject 55-70 Units into the skin 2 (two) times daily. 55 units in the morning and 70 units at night     losartan-hydrochlorothiazide  100-25 MG per tablet  Commonly known as:  HYZAAR  Take 1 tablet by mouth daily.     NOVOLIN R 100 units/mL injection  Generic drug:  insulin regular  Inject 15-20 Units into the skin daily as needed for high blood sugar. Sliding scale: 300-400=20 units, 200-300=15 units (pt states her glucose level is never any lower)     pantoprazole 40 MG tablet  Commonly known as:  PROTONIX  Take 40 mg by mouth daily.     promethazine 25 MG tablet  Commonly known as:  PHENERGAN  Take 1 tablet (25 mg total) by mouth every 6 (six) hours as needed for nausea or vomiting.         At home rest most of the time. Get up 9 or 10 times each day and take a 15 or 20 minute walk. No riding in the car and to your first postoperative appointment. If you have neck  surgery you may shower from the chest down starting on the third postoperative day. If you had back surgery he may start showering on the third postoperative day with saran wrap wrapped around your incisional area 3 times. After the shower remove the saran wrap. Take pain medicine as needed and other medications as instructed. Call my office for an appointment.  SignedFaythe Ghee, MD 06/29/2015, 1:02 PM

## 2015-06-29 NOTE — Transfer of Care (Signed)
Immediate Anesthesia Transfer of Care Note  Patient: Angela Solomon  Procedure(s) Performed: Procedure(s) with comments: LUMBAR LAMINECTOMY WITH COFLEX 1 LEVEL (Bilateral) - LUMBAR LAMINECTOMY WITH COFLEX 1 LEVEL  Patient Location: PACU  Anesthesia Type:General  Level of Consciousness: awake, alert  and oriented  Airway & Oxygen Therapy: Patient Spontanous Breathing and Patient connected to nasal cannula oxygen  Post-op Assessment: Report given to RN and Post -op Vital signs reviewed and stable  Post vital signs: Reviewed and stable  Last Vitals:  Filed Vitals:   06/29/15 1013  BP:   Pulse: 99  Temp:   Resp: 15    Complications: No apparent anesthesia complications

## 2015-06-29 NOTE — Progress Notes (Signed)
Patient alert and oriented, mae's well, voiding adequate amount of urine, swallowing without difficulty, c/o moderate pain and meds given prior to discharged. Patient discharged home with family. Script and discharged instructions given to patient. Patient and family stated understanding of instructions given.  

## 2015-06-29 NOTE — Anesthesia Preprocedure Evaluation (Addendum)
Anesthesia Evaluation  Patient identified by MRN, date of birth, ID band Patient awake    Reviewed: Allergy & Precautions, NPO status , Patient's Chart, lab work & pertinent test results, reviewed documented beta blocker date and time   History of Anesthesia Complications (+) PONV and history of anesthetic complications  Airway Mallampati: I  TM Distance: >3 FB Neck ROM: Full    Dental  (+) Edentulous Upper, Poor Dentition, Missing, Dental Advisory Given   Pulmonary COPD COPD inhaler, Current Smoker,  breath sounds clear to auscultation        Cardiovascular hypertension, Pt. on medications and Pt. on home beta blockers - anginaRhythm:Regular Rate:Normal     Neuro/Psych Depression Chronic back pain: narcotics MS    GI/Hepatic Neg liver ROS, GERD-  Medicated and Controlled,  Endo/Other  diabetes (glu 253), Poorly Controlled, Type 1, Insulin DependentMorbid obesity  Renal/GU negative Renal ROS     Musculoskeletal   Abdominal (+) + obese,   Peds  Hematology negative hematology ROS (+)   Anesthesia Other Findings   Reproductive/Obstetrics                            Anesthesia Physical Anesthesia Plan  ASA: III  Anesthesia Plan:    Post-op Pain Management:    Induction: Intravenous  Airway Management Planned: Oral ETT  Additional Equipment:   Intra-op Plan:   Post-operative Plan: Extubation in OR  Informed Consent: I have reviewed the patients History and Physical, chart, labs and discussed the procedure including the risks, benefits and alternatives for the proposed anesthesia with the patient or authorized representative who has indicated his/her understanding and acceptance.   Dental advisory given  Plan Discussed with: CRNA and Surgeon  Anesthesia Plan Comments: (Plan routine monitors, GETA)        Anesthesia Quick Evaluation

## 2015-06-29 NOTE — Op Note (Signed)
Preop diagnosis: Bilateral severe spinal stenosis secondary to spondylosis and herniated disc L4-5 Postop diagnosis: Same Procedure: Bilateral L4-5 decompressive laminotomy for relief of the central and lateral recess stenosis Right L4-5 microdiscectomy L4-5 intralaminar stabilization with Coflex Surgeon: Pablo Mathurin Asst.: Nundkumar  After being placed the prone position the patient's back was prepped and draped in the usual sterile fashion. Midline incision was made above the spinous processes of L4 and L5. Using Bovie cutting current the incision was carried on the spinous processes. Subperiosteal dissection was then carried out bilaterally along the spinous processes and lamina and subcutaneous tract was placed for exposure. X-ray showed approach the appropriate level. Started on the patient's left side generous laminotomy was performed by removing the inferior one half of the L4 lamina the medial one third of the facet joint the superior one third of the L5 lamina. Residual bone and hypertrophic ligamentum flavum removed in a piecemeal fashion to decompress the paramedian left thecal sac and left L5 nerve root. This was followed out its foramen. Similar decompression was then carried out on the right side with once again decompression of both the central and lateral recess stenosis. We then used the microscope to do and aggressive microdiscectomy at L4-5 on the right. Very large disc relation was identified with superior fragment. With great care thorough displaced cleanout was carried out while the same time great care was taken to avoid injury to the neural elements and this was successfully done. At this time inspection was carried out in all directions for any evidence of residual compression and none could be identified. Irrigation was carried out and any bleeding control proper coagulation Gelfoam. We then prepared the intralaminar region for Coflex intralaminar device. We trialed a 10 mm trial and  felt this was a good choice. We splayed the wings and then placed the Coflex that difficulty. We then crimped the wings back around the spinous processes. Final fluoroscopy in AP lateral direction looked excellent. Irrigation was carried out and Gelfoam placed over the dural exposure for hemostasis. A drain was left in the sitting the Coflex device. The was then closed in multiple layers of Vicryls on the muscle fascia subcutaneous and subcuticular tissues. Dermabond and Steri-Strips were placed on the skin. A sterile dressing was then applied and the patient was extubated and taken to recovery in stable condition.

## 2015-06-29 NOTE — Plan of Care (Signed)
Problem: Consults Goal: Diagnosis - Spinal Surgery Outcome: Completed/Met Date Met:  06/29/15 Lumbar Laminectomy (Complex)

## 2015-06-29 NOTE — Anesthesia Procedure Notes (Signed)
Procedure Name: Intubation Performed by: Mariea Clonts Oxygen Delivery Method: Circle system utilized Preoxygenation: Pre-oxygenation with 100% oxygen Intubation Type: IV induction Ventilation: Mask ventilation without difficulty and Oral airway inserted - appropriate to patient size Laryngoscope Size: Miller and 2 Grade View: Grade II Tube type: Oral Tube size: 7.5 mm Number of attempts: 1 Placement Confirmation: ETT inserted through vocal cords under direct vision,  breath sounds checked- equal and bilateral and positive ETCO2 Tube secured with: Tape Dental Injury: Teeth and Oropharynx as per pre-operative assessment

## 2015-06-29 NOTE — H&P (Signed)
Angela Solomon is an 39 y.o. female.   Chief Complaint: Back and bilateral leg pain HPI: The patient is a 39 year old female who is here for evaluation for back and bilateral leg pain. She been tried on conservative therapy with a variety of modalities without improvement. She underwent imaging studies which showed bilateral stenosis at L4-5. After failing further conservative therapy the patient requested surgery and now comes for bilateral decompression at L4-5 with the intralaminar Coflex device. I've had a long discussion with her regarding the risks and benefits of surgical intervention. The risks discussed include but are not limited to bleeding infection weakness numbness paralysis spinal fluid leak trouble with instrumentation coma and death. We have discussed alternative methods of therapy along with the risks and benefits of nonintervention. She's had the opportunity to ask numerous questions and appears to understand. With this information in hand she has requested that we proceed with surgery.  Past Medical History  Diagnosis Date  . Multiple sclerosis   . Headache   . Hypertension   . Neuropathy   . Vision abnormalities   . PONV (postoperative nausea and vomiting)     sometimes...........  Marland Kitchen Diabetes mellitus without complication     dx at age 83  type 60  . Depression   . GERD (gastroesophageal reflux disease)   . Neuromuscular disorder     dx 2003 with MS  . Anemia     Past Surgical History  Procedure Laterality Date  . Tonsillectomy    . Cholecystectomy    . Dental surgery    . Tubal ligation    . Right foot surgery      remove a piece of glass  (had c/o for about 1 yr)    Family History  Problem Relation Age of Onset  . Hypertension Mother   . Diabetes Mother   . Stroke Mother   . Heart attack Mother   . Peripheral vascular disease Mother    Social History:  reports that she has been smoking Cigarettes.  She has a 24 pack-year smoking history. She does not have  any smokeless tobacco history on file. She reports that she drinks alcohol. She reports that she does not use illicit drugs.  Allergies:  Allergies  Allergen Reactions  . Glatiramer Anaphylaxis    Generic Copaxone  . Lisinopril Anaphylaxis  . Morphine And Related Hives  . Aspirin Nausea And Vomiting    Fevers and GI upset  . Hydrocodone-Acetaminophen Nausea And Vomiting    Pt states can take Hydrocodone but has a reaction to a additive in vicodin  . Zithromax [Azithromycin] Rash    Medications Prior to Admission  Medication Sig Dispense Refill  . acetaminophen (TYLENOL) 500 MG tablet Take 1,000 mg by mouth every 6 (six) hours as needed for mild pain or moderate pain.    Marland Kitchen amitriptyline (ELAVIL) 25 MG tablet Take 25-50 mg by mouth at bedtime.   5  . B-D INS SYR ULTRAFINE 1CC/31G 31G X 5/16" 1 ML MISC See admin instructions.  0  . baclofen (LIORESAL) 10 MG tablet Take 10 mg by mouth 4 (four) times daily as needed for muscle spasms.   2  . carvedilol (COREG) 25 MG tablet Take 25 mg by mouth 2 (two) times daily.     . citalopram (CELEXA) 20 MG tablet Take 20 mg by mouth daily.     . cyclobenzaprine (FLEXERIL) 10 MG tablet Take 1 tablet (10 mg total) by mouth 3 (three) times daily as  needed for muscle spasms. 30 tablet 1  . diazepam (VALIUM) 10 MG tablet Take 1 tablet (10 mg total) by mouth at bedtime as needed for anxiety. 30 tablet 1  . ferrous sulfate 325 (65 FE) MG tablet Take 325 mg by mouth daily.     . fluticasone (FLONASE) 50 MCG/ACT nasal spray Place 2 sprays into both nostrils as needed for allergies.     Marland Kitchen gabapentin (NEURONTIN) 400 MG capsule Take 800 mg by mouth 4 (four) times daily.     Marland Kitchen HYDROcodone-acetaminophen (NORCO) 10-325 MG per tablet Take 1 tablet by mouth 3 (three) times daily as needed. 90 tablet 0  . ibuprofen (ADVIL,MOTRIN) 200 MG tablet Take 400-800 mg by mouth every 6 (six) hours as needed for headache, mild pain or moderate pain.    Marland Kitchen insulin detemir (LEVEMIR)  100 UNIT/ML injection Inject 55-70 Units into the skin 2 (two) times daily. 55 units in the morning and 70 units at night    . interferon beta-1a (AVONEX) 30 MCG injection Inject 30 mcg into the muscle every 7 (seven) days. (Patient taking differently: Inject 30 mcg into the muscle every 7 (seven) days. On thursday) 12 each 3  . losartan-hydrochlorothiazide (HYZAAR) 100-25 MG per tablet Take 1 tablet by mouth daily.     Marland Kitchen NOVOLIN R 100 UNIT/ML injection Inject 15-20 Units into the skin daily as needed for high blood sugar. Sliding scale: 300-400=20 units, 200-300=15 units (pt states her glucose level is never any lower)  0  . pantoprazole (PROTONIX) 40 MG tablet Take 40 mg by mouth daily.     . promethazine (PHENERGAN) 25 MG tablet Take 1 tablet (25 mg total) by mouth every 6 (six) hours as needed for nausea or vomiting. 30 tablet 1  . albuterol (PROVENTIL HFA;VENTOLIN HFA) 108 (90 BASE) MCG/ACT inhaler Inhale 1 puff into the lungs as needed for wheezing or shortness of breath.      Results for orders placed or performed during the hospital encounter of 06/29/15 (from the past 48 hour(s))  Glucose, capillary     Status: Abnormal   Collection Time: 06/29/15  6:30 AM  Result Value Ref Range   Glucose-Capillary 253 (H) 65 - 99 mg/dL  hCG, serum, qualitative     Status: None   Collection Time: 06/29/15  6:38 AM  Result Value Ref Range   Preg, Serum NEGATIVE NEGATIVE    Comment:        THE SENSITIVITY OF THIS METHODOLOGY IS >10 mIU/mL.    No results found.  Positive for diabetes a diagnosis of multiple sclerosis  Blood pressure 159/101, pulse 105, temperature 98.1 F (36.7 C), temperature source Oral, resp. rate 20, weight 108.41 kg (239 lb), last menstrual period 06/29/2015, SpO2 100 %.  The patient is awake or and oriented. She is no facial asymmetry. Her reflexes are decreased but equal. Strength is mildly decreased extensor pollicis longus. Assessment/Plan Impression is that of  stenosis at L4-5. The plan is for a bilateral decompressive laminotomy followed by intralaminar Coflex.  Faythe Ghee, MD 06/29/2015, 7:33 AM

## 2015-06-30 ENCOUNTER — Encounter (HOSPITAL_COMMUNITY): Payer: Self-pay | Admitting: Neurosurgery

## 2015-07-13 ENCOUNTER — Other Ambulatory Visit: Payer: Self-pay

## 2015-07-13 ENCOUNTER — Telehealth: Payer: Self-pay | Admitting: Neurology

## 2015-07-13 NOTE — Telephone Encounter (Signed)
Request enetred, per provider instruction.  Forwarded for signature.

## 2015-07-13 NOTE — Telephone Encounter (Signed)
Ok to refill #90.   

## 2015-07-13 NOTE — Telephone Encounter (Signed)
Patient is requesting Rx for Hydrocodone 10/325mg  #90.  I called the pharmacy.  Spoke with Lennette Bihari.  They last filled this Rx by [place] on 06/09, and patient got #50 from Karie Chimera, MD on 07/05 (surgery).  Okay to refill at this time?  Please advise.  Thank you.

## 2015-07-13 NOTE — Telephone Encounter (Signed)
Patient is calling to order written Rx hydrocodone-acetaminophe 10-325 mg.  Thanks!

## 2015-07-14 ENCOUNTER — Telehealth: Payer: Self-pay | Admitting: Neurology

## 2015-07-14 ENCOUNTER — Encounter: Payer: Self-pay | Admitting: *Deleted

## 2015-07-14 MED ORDER — HYDROCODONE-ACETAMINOPHEN 10-325 MG PO TABS: 1.0000 | ORAL_TABLET | Freq: Four times a day (QID) | ORAL | Status: DC | PRN

## 2015-07-14 NOTE — Telephone Encounter (Signed)
Per previous note, Faith has already placed this Rx at the front desk for pick up. I called the patient back.  Got no answer, left message.

## 2015-07-14 NOTE — Telephone Encounter (Signed)
Pt needs refill on HYDROcodone-acetaminophen (NORCO) 10-325 MG per tablet. °

## 2015-07-14 NOTE — Progress Notes (Signed)
Hydrocodone rx. up front [facility] 

## 2015-07-18 ENCOUNTER — Other Ambulatory Visit: Payer: Self-pay | Admitting: Neurology

## 2015-07-18 MED ORDER — INDOMETHACIN 25 MG PO CAPS: 25.0000 mg | ORAL_CAPSULE | Freq: Three times a day (TID) | ORAL | Status: DC | PRN

## 2015-07-19 ENCOUNTER — Other Ambulatory Visit (HOSPITAL_COMMUNITY): Payer: Self-pay | Admitting: Neurosurgery

## 2015-07-19 DIAGNOSIS — M48061 Spinal stenosis, lumbar region without neurogenic claudication: Secondary | ICD-10-CM

## 2015-07-20 ENCOUNTER — Ambulatory Visit (HOSPITAL_COMMUNITY)
Admission: RE | Admit: 2015-07-20 | Discharge: 2015-07-20 | Disposition: A | Discharge status: Home / Self Care | Payer: Medicaid Other | Source: Ambulatory Visit | Attending: Neurosurgery | Admitting: Neurosurgery

## 2015-07-20 NOTE — Telephone Encounter (Signed)
ERROR

## 2015-07-21 ENCOUNTER — Other Ambulatory Visit: Payer: Self-pay | Admitting: Neurosurgery

## 2015-07-21 ENCOUNTER — Encounter (HOSPITAL_COMMUNITY): Payer: Self-pay | Admitting: *Deleted

## 2015-07-21 ENCOUNTER — Inpatient Hospital Stay (HOSPITAL_COMMUNITY)
Admission: AD | Admit: 2015-07-21 | Discharge: 2015-07-24 | DRG: 858 | Disposition: A | Discharge status: Home Health | Payer: Medicaid Other | Source: Ambulatory Visit | Attending: Neurosurgery | Admitting: Neurosurgery

## 2015-07-21 ENCOUNTER — Inpatient Hospital Stay (HOSPITAL_COMMUNITY): Payer: Medicaid Other

## 2015-07-21 DIAGNOSIS — Z79899 Other long term (current) drug therapy: Secondary | ICD-10-CM | POA: Diagnosis not present

## 2015-07-21 DIAGNOSIS — F17210 Nicotine dependence, cigarettes, uncomplicated: Secondary | ICD-10-CM | POA: Clinically undetermined

## 2015-07-21 DIAGNOSIS — E1040 Type 1 diabetes mellitus with diabetic neuropathy, unspecified: Secondary | ICD-10-CM | POA: Diagnosis present

## 2015-07-21 DIAGNOSIS — L24A9 Irritant contact dermatitis due friction or contact with other specified body fluids: Secondary | ICD-10-CM

## 2015-07-21 DIAGNOSIS — Y838 Other surgical procedures as the cause of abnormal reaction of the patient, or of later complication, without mention of misadventure at the time of the procedure: Secondary | ICD-10-CM | POA: Diagnosis present

## 2015-07-21 DIAGNOSIS — T148XXA Other injury of unspecified body region, initial encounter: Secondary | ICD-10-CM

## 2015-07-21 DIAGNOSIS — T814XXA Infection following a procedure, initial encounter: Secondary | ICD-10-CM | POA: Diagnosis present

## 2015-07-21 DIAGNOSIS — I10 Essential (primary) hypertension: Secondary | ICD-10-CM | POA: Diagnosis present

## 2015-07-21 DIAGNOSIS — K219 Gastro-esophageal reflux disease without esophagitis: Secondary | ICD-10-CM | POA: Diagnosis present

## 2015-07-21 DIAGNOSIS — G9782 Other postprocedural complications and disorders of nervous system: Secondary | ICD-10-CM

## 2015-07-21 DIAGNOSIS — G35 Multiple sclerosis: Secondary | ICD-10-CM | POA: Diagnosis present

## 2015-07-21 DIAGNOSIS — Z794 Long term (current) use of insulin: Secondary | ICD-10-CM

## 2015-07-21 DIAGNOSIS — F329 Major depressive disorder, single episode, unspecified: Secondary | ICD-10-CM | POA: Diagnosis present

## 2015-07-21 DIAGNOSIS — R51 Headache: Secondary | ICD-10-CM | POA: Diagnosis present

## 2015-07-21 DIAGNOSIS — G9600 Cerebrospinal fluid leak, unspecified: Secondary | ICD-10-CM

## 2015-07-21 LAB — GLUCOSE, CAPILLARY
Glucose-Capillary: 447 mg/dL — ABNORMAL HIGH (ref 65–99)
Glucose-Capillary: 333 mg/dL — ABNORMAL HIGH (ref 65–99)
Glucose-Capillary: 253 mg/dL — ABNORMAL HIGH (ref 65–99)

## 2015-07-21 LAB — CBC
HCT: 37 % (ref 36.0–46.0)
HEMOGLOBIN: 13 g/dL (ref 12.0–15.0)
MCH: 30.1 pg (ref 26.0–34.0)
MCHC: 35.1 g/dL (ref 30.0–36.0)
MCV: 85.6 fL (ref 78.0–100.0)
Platelets: 377 10*3/uL (ref 150–400)
RBC: 4.32 MIL/uL (ref 3.87–5.11)
RDW: 11.8 % (ref 11.5–15.5)
WBC: 6.7 10*3/uL (ref 4.0–10.5)

## 2015-07-21 LAB — BASIC METABOLIC PANEL
ANION GAP: 13 (ref 5–15)
BUN: 9 mg/dL (ref 6–20)
CALCIUM: 9.1 mg/dL (ref 8.9–10.3)
CO2: 27 mmol/L (ref 22–32)
Chloride: 89 mmol/L — ABNORMAL LOW (ref 101–111)
Creatinine, Ser: 0.69 mg/dL (ref 0.44–1.00)
GFR calc Af Amer: >60 mL/min (ref >60)
Glucose, Bld: 457 mg/dL — ABNORMAL HIGH (ref 65–99)
Potassium: 3.5 mmol/L (ref 3.5–5.1)
SODIUM: 129 mmol/L — AB (ref 135–145)

## 2015-07-21 LAB — SEDIMENTATION RATE: SED RATE: 81 mm/h — AB (ref 0–22)

## 2015-07-21 MED ORDER — INSULIN ASPART 100 UNIT/ML ~~LOC~~ SOLN: 0.0000 [IU] | Freq: Three times a day (TID) | SUBCUTANEOUS | Status: DC

## 2015-07-21 MED ORDER — SODIUM CHLORIDE 0.9 % IV SOLN: INTRAVENOUS | Status: DC

## 2015-07-21 MED ORDER — ONDANSETRON HCL 4 MG/2ML IJ SOLN
INTRAMUSCULAR | Status: AC
  Administered 2015-07-21: 4 mg via INTRAVENOUS
  Filled 2015-07-21: qty 2

## 2015-07-21 MED ORDER — VANCOMYCIN HCL IN DEXTROSE 1-5 GM/200ML-% IV SOLN
1000.0000 mg | Freq: Two times a day (BID) | INTRAVENOUS | Status: DC
  Administered 2015-07-21 (×2): 1000 mg via INTRAVENOUS
  Filled 2015-07-21 (×3): qty 200

## 2015-07-21 MED ORDER — INSULIN ASPART 100 UNIT/ML ~~LOC~~ SOLN: 0.0000 [IU] | Freq: Every day | SUBCUTANEOUS | Status: DC

## 2015-07-21 MED ORDER — GADOBENATE DIMEGLUMINE 529 MG/ML IV SOLN: 20.0000 mL | Freq: Once | INTRAVENOUS | Status: AC | PRN

## 2015-07-21 MED ORDER — CEFTRIAXONE SODIUM IN DEXTROSE 20 MG/ML IV SOLN
1.0000 g | INTRAVENOUS | Status: DC
  Administered 2015-07-21: 1 g via INTRAVENOUS
  Filled 2015-07-21 (×2): qty 50

## 2015-07-21 MED ORDER — INSULIN ASPART 100 UNIT/ML ~~LOC~~ SOLN
0.0000 [IU] | Freq: Three times a day (TID) | SUBCUTANEOUS | Status: DC
  Administered 2015-07-21: 11 [IU] via SUBCUTANEOUS
  Administered 2015-07-21: 15 [IU] via SUBCUTANEOUS
  Administered 2015-07-22: 11 [IU] via SUBCUTANEOUS

## 2015-07-21 MED ORDER — ONDANSETRON HCL 4 MG/2ML IJ SOLN
4.0000 mg | Freq: Four times a day (QID) | INTRAMUSCULAR | Status: DC | PRN
  Administered 2015-07-21 (×3): 4 mg via INTRAVENOUS
  Filled 2015-07-21 (×2): qty 2

## 2015-07-21 MED ORDER — LORAZEPAM 2 MG/ML IJ SOLN
1.0000 mg | Freq: Once | INTRAMUSCULAR | Status: AC
  Administered 2015-07-21: 1 mg via INTRAVENOUS

## 2015-07-21 MED ORDER — LORAZEPAM 2 MG/ML IJ SOLN
INTRAMUSCULAR | Status: AC
  Administered 2015-07-21: 1 mg via INTRAVENOUS
  Filled 2015-07-21: qty 1

## 2015-07-21 MED ORDER — HYDROMORPHONE HCL 1 MG/ML IJ SOLN
1.5000 mg | INTRAMUSCULAR | Status: DC | PRN
  Administered 2015-07-21 – 2015-07-22 (×2): 1.5 mg via INTRAMUSCULAR
  Filled 2015-07-21 (×2): qty 2

## 2015-07-21 MED ORDER — HYDROMORPHONE HCL 1 MG/ML IJ SOLN
1.5000 mg | INTRAMUSCULAR | Status: AC
Start: 2015-07-21 — End: 2015-07-21
  Administered 2015-07-21: 1.5 mg via INTRAMUSCULAR
  Filled 2015-07-21: qty 2

## 2015-07-21 NOTE — Progress Notes (Signed)
Inpatient Diabetes Program Recommendations  AACE/ADA: New Consensus Statement on Inpatient Glycemic Control (2013)  Target Ranges:  Prepandial:   less than 140 mg/dL      Peak postprandial:   less than 180 mg/dL (1-2 hours)      Critically ill patients:  140 - 180 mg/dL   Inpatient Diabetes Program Recommendations Insulin - Basal: add 1/2 patient home dose Levemir  May also need Novolog RESISTANT scale TID + HS scale per Glycemic Control order set.  Thank you  Raoul Pitch BSN, RN,CDE Inpatient Diabetes Coordinator [phone] (team pager)

## 2015-07-21 NOTE — Progress Notes (Signed)
Patient's dressing saturated with clear/yellow fluid. New dressing applied and patient instructed to lay flat on her back. Patient with headache and chills. Will continue to monitor. Lennix Kneisel, Rande Brunt, RN

## 2015-07-21 NOTE — Progress Notes (Addendum)
Pt CBG was 447, MD notified, STAT glucose placed for verification prior to insulin administration.  Will follow up accordingly.

## 2015-07-21 NOTE — Progress Notes (Addendum)
   07/21/15 1012  Vitals  Temp 97.9 F (36.6 C)  Temp Source Rectal  BP (!) 164/87 mmHg  BP Location Left Arm  BP Method Automatic  Patient Position (if appropriate) Lying  Pulse Rate 66  Resp 14  Oxygen Therapy  SpO2 99 %  O2 Device Room Air  Pain Assessment  Pain Assessment 0-10  Pain Score 10  Pain Type Acute pain  Pain Location Head  Pain Radiating Towards neck-stiffness  Pain Descriptors / Indicators Pounding  Pain Frequency Constant  Pain Onset On-going (worse with head elevation)  Pain Intervention(s) MD notified (Comment)  Height and Weight  Height 6\' 1"  (1.854 m)  Weight 101 kg (222 lb 10.6 oz)  BSA (Calculated - sq m) 2.28 sq meters  BMI (Calculated) 29.4  Weight in (lb) to have BMI = 25 189.1  Pt arrived to 4N05 at 1015.  Pt A&O x 4, c/o 10/10 acute headache. Pt had recent lumbar surgical procedure, site dehisced at the top of the incision appx 1.5 inches, draining significant amount of clear/yellow drainage, site covered with 4x4 and medipore tape.  Pt V/S taken,  Family at the bedside. Pt was direct admit, MD notified of arrival and the aforementioned information.  STAT MRI of lumbar spine w and wo contrast ordered.  Pt updated.  Will monitor.

## 2015-07-21 NOTE — H&P (Signed)
Angela Solomon is an 39 y.o. female.   Chief Complaint: Draining wound and headache HPI: Angela Solomon is a 39 yo female who had a lumbar decompression and coflex about 3 weeks ago. She did not return for follow up for longer than usual, and was noted to have some drainage from her wound. The plan was for compression dressing, strict bedrest and oral antibitoics, and follow up in a few days. Since that time, she has not been coming for eval, and actually went to [place] ED for eval, even when she had an appointment here. We eventually scheduled her for a lumbar MRI to see if she had a CSF leak, but she called shortly before the study saying she did not feel well enough to go to the test. It was therefore elected to admit her at this time so we could manage her pain, and also get an MRI in the hospital as an inpatient. She is not admitted for eval and possible surgery if indicated.  Past Medical History  Diagnosis Date  . Multiple sclerosis   . Headache   . Hypertension   . Neuropathy   . Vision abnormalities   . PONV (postoperative nausea and vomiting)     sometimes...........  Marland Kitchen Diabetes mellitus without complication     dx at age 19  type 66  . Depression   . GERD (gastroesophageal reflux disease)   . Neuromuscular disorder     dx 2003 with Angela  . Anemia     Past Surgical History  Procedure Laterality Date  . Tonsillectomy    . Cholecystectomy    . Dental surgery    . Tubal ligation    . Right foot surgery      remove a piece of glass  (had c/o for about 1 yr)  . Lumbar laminectomy with coflex 1 level Bilateral 06/29/2015    Procedure: LUMBAR LAMINECTOMY WITH COFLEX 1 LEVEL;  Surgeon: Karie Chimera, MD;  Location: [facility];  Service: Neurosurgery;  Laterality: Bilateral;  LUMBAR LAMINECTOMY WITH COFLEX 1 LEVEL    Family History  Problem Relation Age of Onset  . Hypertension Mother   . Diabetes Mother   . Stroke Mother   . Heart attack Mother   . Peripheral vascular disease  Mother    Social History:  reports that she has been smoking Cigarettes.  She has a 24 pack-year smoking history. She does not have any smokeless tobacco history on file. She reports that she drinks alcohol. She reports that she does not use illicit drugs.  Allergies:  Allergies  Allergen Reactions  . Glatiramer Anaphylaxis    Generic Copaxone  . Lisinopril Anaphylaxis  . Morphine And Related Hives  . Aspirin Nausea And Vomiting    Fevers and GI upset  . Hydrocodone-Acetaminophen Nausea And Vomiting    Pt states can take Hydrocodone but has a reaction to a additive in vicodin  . Zithromax [Azithromycin] Rash    Medications Prior to Admission  Medication Sig Dispense Refill  . acetaminophen (TYLENOL) 500 MG tablet Take 1,000 mg by mouth every 6 (six) hours as needed for mild pain or moderate pain.    Marland Kitchen albuterol (PROVENTIL HFA;VENTOLIN HFA) 108 (90 BASE) MCG/ACT inhaler Inhale 1 puff into the lungs as needed for wheezing or shortness of breath.    Marland Kitchen amitriptyline (ELAVIL) 25 MG tablet Take 25-50 mg by mouth at bedtime.   5  . B-D INS SYR ULTRAFINE 1CC/31G 31G X 5/16" 1 ML  MISC See admin instructions.  0  . baclofen (LIORESAL) 10 MG tablet Take 10 mg by mouth 4 (four) times daily as needed for muscle spasms.   2  . carvedilol (COREG) 25 MG tablet Take 25 mg by mouth 2 (two) times daily.     . citalopram (CELEXA) 20 MG tablet Take 20 mg by mouth daily.     . cyclobenzaprine (FLEXERIL) 10 MG tablet Take 1 tablet (10 mg total) by mouth 3 (three) times daily as needed for muscle spasms. 30 tablet 1  . diazepam (VALIUM) 10 MG tablet Take 1 tablet (10 mg total) by mouth at bedtime as needed for anxiety. 30 tablet 1  . ferrous sulfate 325 (65 FE) MG tablet Take 325 mg by mouth daily.     . fluticasone (FLONASE) 50 MCG/ACT nasal spray Place 2 sprays into both nostrils as needed for allergies.     Marland Kitchen gabapentin (NEURONTIN) 400 MG capsule Take 800 mg by mouth 4 (four) times daily.     Marland Kitchen  HYDROcodone-acetaminophen (NORCO) 10-325 MG per tablet Take 1 tablet by mouth every 6 (six) hours as needed. 90 tablet 0  . indomethacin (INDOCIN) 25 MG capsule Take 1 capsule (25 mg total) by mouth 3 (three) times daily as needed. 15 capsule 0  . insulin detemir (LEVEMIR) 100 UNIT/ML injection Inject 55-70 Units into the skin 2 (two) times daily. 55 units in the morning and 70 units at night    . interferon beta-1a (AVONEX) 30 MCG injection Inject 30 mcg into the muscle every 7 (seven) days. (Patient taking differently: Inject 30 mcg into the muscle every 7 (seven) days. On thursday) 12 each 3  . losartan-hydrochlorothiazide (HYZAAR) 100-25 MG per tablet Take 1 tablet by mouth daily.     Marland Kitchen NOVOLIN R 100 UNIT/ML injection Inject 15-20 Units into the skin daily as needed for high blood sugar. Sliding scale: 300-400=20 units, 200-300=15 units (pt states her glucose level is never any lower)  0  . pantoprazole (PROTONIX) 40 MG tablet Take 40 mg by mouth daily.     . promethazine (PHENERGAN) 25 MG tablet Take 1 tablet (25 mg total) by mouth every 6 (six) hours as needed for nausea or vomiting. 30 tablet 1    No results found for this or any previous visit (from the past 48 hour(s)). No results found.  Review of systems not obtained due to patient factors.  Blood pressure 164/87, pulse 66, temperature 97.9 F (36.6 C), temperature source Rectal, resp. rate 14, height 6\' 1"  (1.854 m), weight 101 kg (222 lb 10.6 oz), last menstrual period 06/08/2015, SpO2 99 %.  Incision/Wound:her incision is draining a lot of blood tinged fluid that may be serous, or possibly CSF. Assessment/Plan Impression is of chronic draining wound. This could be CSF, though there was not a dural breach noted at her initial surgery. The plan is for an emergent MRI with possible surgery based on the findings.  Faythe Ghee, MD 07/21/2015, 11:32 AM

## 2015-07-22 ENCOUNTER — Inpatient Hospital Stay (HOSPITAL_COMMUNITY): Payer: Medicaid Other | Admitting: Anesthesiology

## 2015-07-22 ENCOUNTER — Encounter (HOSPITAL_COMMUNITY): Payer: Self-pay | Admitting: Anesthesiology

## 2015-07-22 ENCOUNTER — Encounter (HOSPITAL_COMMUNITY)
Admission: AD | Disposition: A | Discharge status: Home Health | Payer: Self-pay | Source: Ambulatory Visit | Attending: Neurosurgery

## 2015-07-22 HISTORY — PX: WOUND EXPLORATION: SHX6188

## 2015-07-22 LAB — GLUCOSE, CAPILLARY
GLUCOSE-CAPILLARY: 255 mg/dL — AB (ref 65–99)
GLUCOSE-CAPILLARY: 218 mg/dL — AB (ref 65–99)
Glucose-Capillary: 311 mg/dL — ABNORMAL HIGH (ref 65–99)
Glucose-Capillary: 202 mg/dL — ABNORMAL HIGH (ref 65–99)
Glucose-Capillary: 313 mg/dL — ABNORMAL HIGH (ref 65–99)
Glucose-Capillary: 207 mg/dL — ABNORMAL HIGH (ref 65–99)

## 2015-07-22 LAB — HEMOGLOBIN A1C
Hgb A1c MFr Bld: 12.3 % — ABNORMAL HIGH (ref 4.8–5.6)
Mean Plasma Glucose: 306 mg/dL

## 2015-07-22 SURGERY — WOUND EXPLORATION
Anesthesia: General

## 2015-07-22 MED ORDER — PANTOPRAZOLE SODIUM 40 MG IV SOLR
40.0000 mg | Freq: Every day | INTRAVENOUS | Status: DC
  Administered 2015-07-22: 40 mg via INTRAVENOUS
  Filled 2015-07-22: qty 40

## 2015-07-22 MED ORDER — SODIUM CHLORIDE 0.9 % IJ SOLN: 3.0000 mL | INTRAMUSCULAR | Status: DC | PRN

## 2015-07-22 MED ORDER — FENTANYL CITRATE (PF) 100 MCG/2ML IJ SOLN
INTRAMUSCULAR | Status: DC | PRN
  Administered 2015-07-22 (×2): 50 ug via INTRAVENOUS
  Administered 2015-07-22: 100 ug via INTRAVENOUS
  Administered 2015-07-22: 50 ug via INTRAVENOUS

## 2015-07-22 MED ORDER — DEXAMETHASONE SODIUM PHOSPHATE 4 MG/ML IJ SOLN
INTRAMUSCULAR | Status: AC
  Filled 2015-07-22: qty 2

## 2015-07-22 MED ORDER — INSULIN DETEMIR 100 UNIT/ML ~~LOC~~ SOLN
55.0000 [IU] | Freq: Two times a day (BID) | SUBCUTANEOUS | Status: DC
Start: 2015-07-22 — End: 2015-07-22
  Filled 2015-07-22 (×2): qty 0.7

## 2015-07-22 MED ORDER — DIAZEPAM 5 MG PO TABS
5.0000 mg | ORAL_TABLET | Freq: Four times a day (QID) | ORAL | Status: DC | PRN
  Administered 2015-07-22 – 2015-07-24 (×4): 5 mg via ORAL
  Filled 2015-07-22 (×4): qty 1

## 2015-07-22 MED ORDER — DIAZEPAM 5 MG PO TABS
ORAL_TABLET | ORAL | Status: AC
  Administered 2015-07-22: 5 mg
  Filled 2015-07-22: qty 1

## 2015-07-22 MED ORDER — INSULIN ASPART 100 UNIT/ML ~~LOC~~ SOLN
0.0000 [IU] | Freq: Three times a day (TID) | SUBCUTANEOUS | Status: DC
  Administered 2015-07-22: 11 [IU] via SUBCUTANEOUS
  Administered 2015-07-23 (×2): 5 [IU] via SUBCUTANEOUS
  Administered 2015-07-24: 8 [IU] via SUBCUTANEOUS

## 2015-07-22 MED ORDER — ONDANSETRON HCL 4 MG/2ML IJ SOLN
4.0000 mg | INTRAMUSCULAR | Status: DC | PRN
  Administered 2015-07-22 – 2015-07-23 (×2): 4 mg via INTRAVENOUS
  Filled 2015-07-22 (×2): qty 2

## 2015-07-22 MED ORDER — POTASSIUM CHLORIDE IN NACL 20-0.45 MEQ/L-% IV SOLN
INTRAVENOUS | Status: DC
  Administered 2015-07-22 – 2015-07-23 (×4): via INTRAVENOUS
  Filled 2015-07-22 (×8): qty 1000

## 2015-07-22 MED ORDER — LOSARTAN POTASSIUM-HCTZ 100-25 MG PO TABS: 1.0000 | ORAL_TABLET | Freq: Every day | ORAL | Status: DC

## 2015-07-22 MED ORDER — ACETAMINOPHEN 325 MG PO TABS
650.0000 mg | ORAL_TABLET | ORAL | Status: DC | PRN
  Administered 2015-07-24: 650 mg via ORAL
  Filled 2015-07-22: qty 2

## 2015-07-22 MED ORDER — VANCOMYCIN HCL 1000 MG IV SOLR
INTRAVENOUS | Status: AC
  Filled 2015-07-22: qty 1000

## 2015-07-22 MED ORDER — PROPOFOL 10 MG/ML IV BOLUS
INTRAVENOUS | Status: DC | PRN
  Administered 2015-07-22: 40 mg via INTRAVENOUS
  Administered 2015-07-22: 160 mg via INTRAVENOUS

## 2015-07-22 MED ORDER — NEOSTIGMINE METHYLSULFATE 10 MG/10ML IV SOLN
INTRAVENOUS | Status: DC | PRN
  Administered 2015-07-22: 4 mg via INTRAVENOUS

## 2015-07-22 MED ORDER — LACTATED RINGERS IV SOLN
INTRAVENOUS | Status: DC | PRN
  Administered 2015-07-22 (×2): via INTRAVENOUS

## 2015-07-22 MED ORDER — INSULIN ASPART 100 UNIT/ML ~~LOC~~ SOLN
4.0000 [IU] | Freq: Three times a day (TID) | SUBCUTANEOUS | Status: DC
  Administered 2015-07-22 – 2015-07-24 (×3): 4 [IU] via SUBCUTANEOUS

## 2015-07-22 MED ORDER — HYDROMORPHONE HCL 1 MG/ML IJ SOLN
0.2500 mg | INTRAMUSCULAR | Status: DC | PRN
  Administered 2015-07-22 (×5): 0.5 mg via INTRAVENOUS
  Filled 2015-07-22: qty 1

## 2015-07-22 MED ORDER — BISACODYL 5 MG PO TBEC
5.0000 mg | DELAYED_RELEASE_TABLET | Freq: Every day | ORAL | Status: DC | PRN
  Administered 2015-07-22: 5 mg via ORAL
  Filled 2015-07-22: qty 1

## 2015-07-22 MED ORDER — PROMETHAZINE HCL 25 MG/ML IJ SOLN: 6.2500 mg | INTRAMUSCULAR | Status: DC | PRN

## 2015-07-22 MED ORDER — PHENOL 1.4 % MT LIQD: 1.0000 | OROMUCOSAL | Status: DC | PRN

## 2015-07-22 MED ORDER — AMITRIPTYLINE HCL 25 MG PO TABS
50.0000 mg | ORAL_TABLET | Freq: Every day | ORAL | Status: DC
  Administered 2015-07-22 – 2015-07-23 (×2): 50 mg via ORAL
  Filled 2015-07-22 (×2): qty 2

## 2015-07-22 MED ORDER — OXYCODONE-ACETAMINOPHEN 5-325 MG PO TABS
1.0000 | ORAL_TABLET | ORAL | Status: DC | PRN
  Administered 2015-07-22 – 2015-07-23 (×4): 2 via ORAL
  Filled 2015-07-22 (×4): qty 2

## 2015-07-22 MED ORDER — ROCURONIUM BROMIDE 50 MG/5ML IV SOLN
INTRAVENOUS | Status: AC
  Filled 2015-07-22: qty 1

## 2015-07-22 MED ORDER — ONDANSETRON HCL 4 MG/2ML IJ SOLN
INTRAMUSCULAR | Status: AC
  Filled 2015-07-22: qty 2

## 2015-07-22 MED ORDER — LOSARTAN POTASSIUM 50 MG PO TABS
100.0000 mg | ORAL_TABLET | Freq: Every day | ORAL | Status: DC
  Administered 2015-07-22 – 2015-07-24 (×2): 100 mg via ORAL
  Filled 2015-07-22 (×3): qty 2

## 2015-07-22 MED ORDER — FENTANYL CITRATE (PF) 250 MCG/5ML IJ SOLN
INTRAMUSCULAR | Status: AC
  Filled 2015-07-22: qty 5

## 2015-07-22 MED ORDER — HYDROMORPHONE HCL 1 MG/ML IJ SOLN
INTRAMUSCULAR | Status: AC
  Filled 2015-07-22: qty 2

## 2015-07-22 MED ORDER — HEMOSTATIC AGENTS (NO CHARGE) OPTIME
TOPICAL | Status: DC | PRN
  Administered 2015-07-22: 1 via TOPICAL

## 2015-07-22 MED ORDER — HYDROCHLOROTHIAZIDE 25 MG PO TABS
25.0000 mg | ORAL_TABLET | Freq: Every day | ORAL | Status: DC
  Administered 2015-07-22 – 2015-07-24 (×2): 25 mg via ORAL
  Filled 2015-07-22 (×3): qty 1

## 2015-07-22 MED ORDER — ROCURONIUM BROMIDE 100 MG/10ML IV SOLN
INTRAVENOUS | Status: DC | PRN
  Administered 2015-07-22: 25 mg via INTRAVENOUS
  Administered 2015-07-22: 10 mg via INTRAVENOUS

## 2015-07-22 MED ORDER — SODIUM CHLORIDE 0.9 % IJ SOLN
3.0000 mL | Freq: Two times a day (BID) | INTRAMUSCULAR | Status: DC
  Administered 2015-07-23: 3 mL via INTRAVENOUS

## 2015-07-22 MED ORDER — CARVEDILOL 12.5 MG PO TABS
25.0000 mg | ORAL_TABLET | Freq: Two times a day (BID) | ORAL | Status: DC
  Administered 2015-07-22 – 2015-07-24 (×4): 25 mg via ORAL
  Filled 2015-07-22 (×5): qty 2

## 2015-07-22 MED ORDER — CEFTRIAXONE SODIUM IN DEXTROSE 20 MG/ML IV SOLN
1.0000 g | INTRAVENOUS | Status: DC
  Administered 2015-07-22 – 2015-07-24 (×3): 1 g via INTRAVENOUS
  Filled 2015-07-22 (×4): qty 50

## 2015-07-22 MED ORDER — MEPERIDINE HCL 25 MG/ML IJ SOLN
6.2500 mg | INTRAMUSCULAR | Status: DC | PRN
Start: 2015-07-22 — End: 2015-07-24

## 2015-07-22 MED ORDER — GLYCOPYRROLATE 0.2 MG/ML IJ SOLN
INTRAMUSCULAR | Status: DC | PRN
  Administered 2015-07-22: 0.6 mg via INTRAVENOUS

## 2015-07-22 MED ORDER — VANCOMYCIN HCL 10 G IV SOLR
1250.0000 mg | Freq: Once | INTRAVENOUS | Status: DC
  Filled 2015-07-22: qty 1250

## 2015-07-22 MED ORDER — INSULIN DETEMIR 100 UNIT/ML ~~LOC~~ SOLN
55.0000 [IU] | Freq: Every day | SUBCUTANEOUS | Status: DC
Start: 2015-07-23 — End: 2015-07-24
  Administered 2015-07-23 – 2015-07-24 (×2): 55 [IU] via SUBCUTANEOUS
  Filled 2015-07-22 (×4): qty 0.55

## 2015-07-22 MED ORDER — THROMBIN 5000 UNITS EX SOLR
CUTANEOUS | Status: DC | PRN
  Administered 2015-07-22 (×2): 5000 [IU] via TOPICAL

## 2015-07-22 MED ORDER — ACETAMINOPHEN 650 MG RE SUPP: 650.0000 mg | RECTAL | Status: DC | PRN

## 2015-07-22 MED ORDER — DOCUSATE SODIUM 100 MG PO CAPS
100.0000 mg | ORAL_CAPSULE | Freq: Two times a day (BID) | ORAL | Status: DC
  Administered 2015-07-22 – 2015-07-24 (×4): 100 mg via ORAL
  Filled 2015-07-22 (×4): qty 1

## 2015-07-22 MED ORDER — INSULIN DETEMIR 100 UNIT/ML ~~LOC~~ SOLN
70.0000 [IU] | Freq: Every day | SUBCUTANEOUS | Status: DC
  Administered 2015-07-22 – 2015-07-23 (×2): 70 [IU] via SUBCUTANEOUS
  Filled 2015-07-22 (×3): qty 0.7

## 2015-07-22 MED ORDER — MENTHOL 3 MG MT LOZG: 1.0000 | LOZENGE | OROMUCOSAL | Status: DC | PRN

## 2015-07-22 MED ORDER — MIDAZOLAM HCL 2 MG/2ML IJ SOLN
INTRAMUSCULAR | Status: AC
  Filled 2015-07-22: qty 2

## 2015-07-22 MED ORDER — SODIUM CHLORIDE 0.9 % IV SOLN: 250.0000 mL | INTRAVENOUS | Status: DC

## 2015-07-22 MED ORDER — VANCOMYCIN HCL 1000 MG IV SOLR
INTRAVENOUS | Status: DC | PRN
  Administered 2015-07-22: 1000 mg

## 2015-07-22 MED ORDER — PROPOFOL 10 MG/ML IV BOLUS
INTRAVENOUS | Status: AC
  Filled 2015-07-22: qty 20

## 2015-07-22 MED ORDER — CITALOPRAM HYDROBROMIDE 10 MG PO TABS
20.0000 mg | ORAL_TABLET | Freq: Every day | ORAL | Status: DC
  Administered 2015-07-22 – 2015-07-24 (×3): 20 mg via ORAL
  Filled 2015-07-22 (×3): qty 2

## 2015-07-22 MED ORDER — MIDAZOLAM HCL 5 MG/5ML IJ SOLN
INTRAMUSCULAR | Status: DC | PRN
  Administered 2015-07-22: 2 mg via INTRAVENOUS

## 2015-07-22 MED ORDER — ONDANSETRON HCL 4 MG/2ML IJ SOLN
INTRAMUSCULAR | Status: DC | PRN
  Administered 2015-07-22: 4 mg via INTRAVENOUS

## 2015-07-22 MED ORDER — LIDOCAINE HCL (CARDIAC) 20 MG/ML IV SOLN
INTRAVENOUS | Status: AC
  Filled 2015-07-22: qty 5

## 2015-07-22 MED ORDER — MORPHINE SULFATE 2 MG/ML IJ SOLN: 1.0000 mg | INTRAMUSCULAR | Status: DC | PRN

## 2015-07-22 MED ORDER — INSULIN ASPART 100 UNIT/ML ~~LOC~~ SOLN
0.0000 [IU] | Freq: Every day | SUBCUTANEOUS | Status: DC
  Administered 2015-07-22 – 2015-07-23 (×2): 2 [IU] via SUBCUTANEOUS

## 2015-07-22 MED ORDER — LIDOCAINE HCL (CARDIAC) 20 MG/ML IV SOLN
INTRAVENOUS | Status: DC | PRN
  Administered 2015-07-22 (×2): 50 mg via INTRAVENOUS

## 2015-07-22 MED ORDER — ALBUTEROL SULFATE (2.5 MG/3ML) 0.083% IN NEBU: 1.0000 mL | INHALATION_SOLUTION | RESPIRATORY_TRACT | Status: DC | PRN

## 2015-07-22 MED ORDER — GABAPENTIN 400 MG PO CAPS
800.0000 mg | ORAL_CAPSULE | Freq: Four times a day (QID) | ORAL | Status: DC
  Administered 2015-07-22 – 2015-07-24 (×9): 800 mg via ORAL
  Filled 2015-07-22 (×9): qty 2

## 2015-07-22 MED ORDER — VANCOMYCIN HCL IN DEXTROSE 1-5 GM/200ML-% IV SOLN
1000.0000 mg | Freq: Three times a day (TID) | INTRAVENOUS | Status: DC
  Administered 2015-07-22 – 2015-07-24 (×7): 1000 mg via INTRAVENOUS
  Filled 2015-07-22 (×9): qty 200

## 2015-07-22 MED ORDER — 0.9 % SODIUM CHLORIDE (POUR BTL) OPTIME
TOPICAL | Status: DC | PRN
  Administered 2015-07-22: 1000 mL

## 2015-07-22 SURGICAL SUPPLY — 50 items
ADH SKN CLS LQ APL DERMABOND (GAUZE/BANDAGES/DRESSINGS) ×1
APL SKNCLS STERI-STRIP NONHPOA (GAUZE/BANDAGES/DRESSINGS) ×1
BAG DECANTER FOR FLEXI CONT (MISCELLANEOUS) ×3 IMPLANT
BENZOIN TINCTURE PRP APPL 2/3 (GAUZE/BANDAGES/DRESSINGS) ×3 IMPLANT
BLADE CLIPPER SURG (BLADE) ×2 IMPLANT
BRUSH SCRUB EZ PLAIN DRY (MISCELLANEOUS) ×3 IMPLANT
CANISTER SUCT 3000ML PPV (MISCELLANEOUS) ×3 IMPLANT
CLEANER TIP ELECTROSURG 2X2 (MISCELLANEOUS) ×3 IMPLANT
CLOSURE WOUND 1/2 X4 (GAUZE/BANDAGES/DRESSINGS) ×1
CONT SPEC 4OZ CLIKSEAL STRL BL (MISCELLANEOUS) IMPLANT
DERMABOND ADHESIVE PROPEN (GAUZE/BANDAGES/DRESSINGS) ×2
DERMABOND ADVANCED .7 DNX6 (GAUZE/BANDAGES/DRESSINGS) IMPLANT
DRAPE LAPAROTOMY 100X72X124 (DRAPES) ×3 IMPLANT
DRSG OPSITE POSTOP 4X6 (GAUZE/BANDAGES/DRESSINGS) ×2 IMPLANT
DRSG TELFA 3X8 NADH (GAUZE/BANDAGES/DRESSINGS) ×6 IMPLANT
ELECT REM PT RETURN 9FT ADLT (ELECTROSURGICAL) ×3
ELECTRODE REM PT RTRN 9FT ADLT (ELECTROSURGICAL) ×1 IMPLANT
GAUZE SPONGE 4X4 12PLY STRL (GAUZE/BANDAGES/DRESSINGS) ×3 IMPLANT
GAUZE SPONGE 4X4 16PLY XRAY LF (GAUZE/BANDAGES/DRESSINGS) IMPLANT
GLOVE ECLIPSE 8.0 STRL XLNG CF (GLOVE) ×3 IMPLANT
GLOVE EXAM NITRILE LRG STRL (GLOVE) IMPLANT
GLOVE EXAM NITRILE XL STR (GLOVE) IMPLANT
GLOVE EXAM NITRILE XS STR PU (GLOVE) IMPLANT
GLOVE INDICATOR 7.5 STRL GRN (GLOVE) ×2 IMPLANT
GLOVE SURG SS PI 7.5 STRL IVOR (GLOVE) ×4 IMPLANT
GOWN STRL REUS W/ TWL LRG LVL3 (GOWN DISPOSABLE) IMPLANT
GOWN STRL REUS W/ TWL XL LVL3 (GOWN DISPOSABLE) IMPLANT
GOWN STRL REUS W/TWL 2XL LVL3 (GOWN DISPOSABLE) ×3 IMPLANT
GOWN STRL REUS W/TWL LRG LVL3 (GOWN DISPOSABLE) ×6
GOWN STRL REUS W/TWL XL LVL3 (GOWN DISPOSABLE)
KIT BASIN OR (CUSTOM PROCEDURE TRAY) ×3 IMPLANT
KIT ROOM TURNOVER OR (KITS) ×3 IMPLANT
NS IRRIG 1000ML POUR BTL (IV SOLUTION) ×3 IMPLANT
PACK LAMINECTOMY NEURO (CUSTOM PROCEDURE TRAY) ×3 IMPLANT
PAD ARMBOARD 7.5X6 YLW CONV (MISCELLANEOUS) ×9 IMPLANT
PAD DRESSING TELFA 3X8 NADH (GAUZE/BANDAGES/DRESSINGS) ×1 IMPLANT
SPONGE SURGIFOAM ABS GEL SZ50 (HEMOSTASIS) ×3 IMPLANT
STAPLER SKIN PROX WIDE 3.9 (STAPLE) ×3 IMPLANT
STRIP CLOSURE SKIN 1/2X4 (GAUZE/BANDAGES/DRESSINGS) ×1 IMPLANT
SUT VIC AB 0 CT1 18XCR BRD8 (SUTURE) IMPLANT
SUT VIC AB 0 CT1 8-18 (SUTURE) ×3
SUT VIC AB 2-0 OS6 18 (SUTURE) ×7 IMPLANT
SUT VIC AB 3-0 CP2 18 (SUTURE) ×3 IMPLANT
SWAB COLLECTION DEVICE MRSA (MISCELLANEOUS) ×3 IMPLANT
SWAB CULTURE LIQ STUART DBL (MISCELLANEOUS) ×2 IMPLANT
SYR 20ML ECCENTRIC (SYRINGE) ×3 IMPLANT
TOWEL OR 17X24 6PK STRL BLUE (TOWEL DISPOSABLE) ×3 IMPLANT
TOWEL OR 17X26 10 PK STRL BLUE (TOWEL DISPOSABLE) ×3 IMPLANT
TUBE ANAEROBIC SPECIMEN COL (MISCELLANEOUS) ×3 IMPLANT
WATER STERILE IRR 1000ML POUR (IV SOLUTION) ×3 IMPLANT

## 2015-07-22 NOTE — Progress Notes (Signed)
ANTIBIOTIC CONSULT NOTE - INITIAL  Pharmacy Consult for Vancomycin Indication: post-op spinal surgery  Allergies  Allergen Reactions  . Glatiramer Anaphylaxis    Generic Copaxone  . Lisinopril Anaphylaxis  . Morphine And Related Hives  . Aspirin Nausea And Vomiting    Fevers and GI upset  . Hydrocodone-Acetaminophen Nausea And Vomiting    Pt states can take Hydrocodone but has a reaction to a additive in vicodin  . Zithromax [Azithromycin] Rash    Patient Measurements: Height: 6\' 1"  (185.4 cm) Weight: 222 lb 10.6 oz (101 kg) IBW/kg (Calculated) : 75.4 Adjusted Body Weight:    Vital Signs: Temp: 98.1 F (36.7 C) (07/28 1314) Temp Source: Oral (07/28 1314) BP: 115/67 mmHg (07/28 1314) Pulse Rate: 96 (07/28 1314) Intake/Output from previous day:   Intake/Output from this shift: Total I/O In: 1490 [P.O.:40; I.V.:1450] Out: 40 [Blood:40]  Labs:  Recent Labs  07/21/15 1307  WBC 6.7  HGB 13.0  PLT 377  CREATININE 0.69   Estimated Creatinine Clearance: 126.3 mL/min (by C-G formula based on Cr of 0.69). No results for input(s): VANCOTROUGH, VANCOPEAK, VANCORANDOM, GENTTROUGH, GENTPEAK, GENTRANDOM, TOBRATROUGH, TOBRAPEAK, TOBRARND, AMIKACINPEAK, AMIKACINTROU, AMIKACIN in the last 72 hours.   Microbiology: Recent Results (from the past 720 hour(s))  Surgical pcr screen     Status: None   Collection Time: 06/22/15  3:18 PM  Result Value Ref Range Status   MRSA, PCR NEGATIVE NEGATIVE Final   Staphylococcus aureus NEGATIVE NEGATIVE Final    Comment:        The Xpert SA Assay (FDA approved for NASAL specimens in patients over 28 years of age), is one component of a comprehensive surveillance program.  Test performance has been validated by [facility] for patients greater than or equal to 41 year old. It is not intended to diagnose infection nor to guide or monitor treatment.     Medical History: Past Medical History  Diagnosis Date  . Multiple sclerosis    . Headache   . Hypertension   . Neuropathy   . Vision abnormalities   . PONV (postoperative nausea and vomiting)     sometimes...........  Marland Kitchen Diabetes mellitus without complication     dx at age 5  type 27  . Depression   . GERD (gastroesophageal reflux disease)   . Neuromuscular disorder     dx 2003 with MS  . Anemia     Medications:  Prescriptions prior to admission  Medication Sig Dispense Refill Last Dose  . amitriptyline (ELAVIL) 25 MG tablet Take 50 mg by mouth at bedtime.   5 07/20/2015 at Unknown time  . carvedilol (COREG) 25 MG tablet Take 25 mg by mouth 2 (two) times daily.    07/21/2015 at [address]  . citalopram (CELEXA) 20 MG tablet Take 20 mg by mouth daily.    07/20/2015 at Unknown time  . clindamycin (CLEOCIN) 150 MG capsule Take 150 mg by mouth 4 (four) times daily.  0 07/21/2015 at Unknown time  . cyclobenzaprine (FLEXERIL) 10 MG tablet Take 1 tablet (10 mg total) by mouth 3 (three) times daily as needed for muscle spasms. 30 tablet 1 Past Month at Unknown time  . diazepam (VALIUM) 10 MG tablet Take 1 tablet (10 mg total) by mouth at bedtime as needed for anxiety. 30 tablet 1 Past Week at Unknown time  . ferrous sulfate 325 (65 FE) MG tablet Take 325 mg by mouth daily.    07/21/2015 at Unknown time  . gabapentin (NEURONTIN) 400  MG capsule Take 800 mg by mouth 4 (four) times daily.    07/20/2015 at Unknown time  . insulin detemir (LEVEMIR) 100 UNIT/ML injection Inject 55-70 Units into the skin 2 (two) times daily. 55 units in the morning and 70 units at night   07/20/2015 at Unknown time  . losartan-hydrochlorothiazide (HYZAAR) 100-25 MG per tablet Take 1 tablet by mouth daily.    07/21/2015 at Unknown time  . methylPREDNISolone (MEDROL DOSEPAK) 4 MG TBPK tablet Take 4 mg by mouth daily as needed (with percocet).   0 07/21/2015 at Unknown time  . NOVOLIN R 100 UNIT/ML injection Inject 15-20 Units into the skin daily as needed for high blood sugar. Sliding scale: 300-400=20 units,  200-300=15 units (pt states her glucose level is never any lower)  0 07/20/2015 at Unknown time  . pantoprazole (PROTONIX) 40 MG tablet Take 40 mg by mouth at bedtime.    07/20/2015 at Unknown time  . promethazine (PHENERGAN) 25 MG tablet Take 1 tablet (25 mg total) by mouth every 6 (six) hours as needed for nausea or vomiting. 30 tablet 1 07/20/2015 at Unknown time  . acetaminophen (TYLENOL) 500 MG tablet Take 1,000 mg by mouth every 6 (six) hours as needed for mild pain or moderate pain.   07/16/2015  . albuterol (PROVENTIL HFA;VENTOLIN HFA) 108 (90 BASE) MCG/ACT inhaler Inhale 1 puff into the lungs as needed for wheezing or shortness of breath.   1 year  . B-D INS SYR ULTRAFINE 1CC/31G 31G X 5/16" 1 ML MISC See admin instructions.  0   . fluticasone (FLONASE) 50 MCG/ACT nasal spray Place 2 sprays into both nostrils as needed for allergies.    Nov 2015  . HYDROcodone-acetaminophen (NORCO) 10-325 MG per tablet Take 1 tablet by mouth every 6 (six) hours as needed. (Patient taking differently: Take 1 tablet by mouth every 6 (six) hours as needed for moderate pain. ) 90 tablet 0 07/18/2015  . indomethacin (INDOCIN) 25 MG capsule Take 1 capsule (25 mg total) by mouth 3 (three) times daily as needed. (Patient taking differently: Take 25 mg by mouth 3 (three) times daily as needed for mild pain. ) 15 capsule 0 07/19/2015  . interferon beta-1a (AVONEX) 30 MCG injection Inject 30 mcg into the muscle every 7 (seven) days. (Patient taking differently: Inject 30 mcg into the muscle every 7 (seven) days. On thursday) 12 each 3 07/08/2015   Assessment: 39 y/o F with a lumbar decompression and coflex about 3 weeks ago presents with wound drainage and headache. 7/28 pt underwent exploration of lumbar wound with irrigation and reclosure with placement of a drain. Na low at 129, Scr ok 0.69 with CrCl >100. Plan to start post-op Vancomycin while drain remains in. Vancomycin 1g/12h ordere by Dr. Hal Neer 7/27 with dose given at  1800 and midnight? No early morning pre-op abx noted. Patient is currently also on Rocephin.  Goal of Therapy:  Vancomycin trough level 10-15 mcg/ml  Plan:  Vancomycin 1g IV q8hr. Trough after 3-5 doses at steady state depending on LOT.   Gracin Mcpartland S. Alford Highland, PharmD, BCPS Clinical Staff Pharmacist Pager [phone]  Eilene Ghazi Stillinger 07/22/2015,1:25 PM

## 2015-07-22 NOTE — Op Note (Signed)
Preop diagnosis: Nonhealing lumbar wound possible infection Postop diagnosis: Same Procedure: Exploration of lumbar wound with irrigation and reclosure Surgeon: Shanera Meske  After being placed the prone position the patient's back was prepped and draped in the usual sterile fashion. Previous lumbar incision was opened up. Some serous fluid was encountered in the superficial tissue this was cultured in standard fashion. We then opened up the incision further down and saw no evidence of seroma or infectious fluid deep within the incision. We placed a self-retaining retractor for exposure and irrigated copiously with both anabolic and saline irrigation. We placed some Gelfoam over the previous dural exposure. We left a drain in the vicinity of the Coflex and also down into the epidural space. We did Valsalva and saw no evidence of spinal fluid leakage. We closed the wound in multiple layers of Vicryls on the fascia subcutaneous and subcuticular tissues. Dermabond and Steri-Strips were placed on the skin. A sterile dressing was then applied and the patient was extubated and taken to recovery room in stable condition.

## 2015-07-22 NOTE — Transfer of Care (Signed)
Immediate Anesthesia Transfer of Care Note  Patient: Angela Solomon  Procedure(s) Performed: Procedure(s): WOUND EXPLORATION (N/A)  Patient Location: PACU  Anesthesia Type:General  Level of Consciousness: awake, alert , oriented and patient cooperative  Airway & Oxygen Therapy: Patient Spontanous Breathing  Post-op Assessment: Report given to RN and Post -op Vital signs reviewed and stable  Post vital signs: Reviewed and stable  Last Vitals:  Filed Vitals:   07/22/15 0514  BP: 138/88  Pulse: 97  Temp: 36.9 C  Resp: 20    Complications: No apparent anesthesia complications

## 2015-07-22 NOTE — Progress Notes (Signed)
Utilization review completed. Kinslei Labine, RN, BSN. 

## 2015-07-22 NOTE — Progress Notes (Signed)
Patient ID: Angela Solomon, female   DOB: 1976/09/16, 39 y.o.   MRN: [id number] Afeb, vss No new neuro issues Her MRI does not appear to show a CSF leak. She is still putting out a lot of serous fluid from the wound, and we will therefore explore the wound, wash it out, and leave a drain and reclose. She understands the plan and wants to proceed as scheduled.

## 2015-07-22 NOTE — Plan of Care (Signed)
Problem: Consults Goal: Diagnosis - Spinal Surgery Outcome: Completed/Met Date Met:  07/22/15 Microdiscectomy

## 2015-07-22 NOTE — Anesthesia Preprocedure Evaluation (Addendum)
Anesthesia Evaluation  Patient identified by MRN, date of birth, ID band Patient awake    Reviewed: Allergy & Precautions, NPO status , Patient's Chart, lab work & pertinent test results, reviewed documented beta blocker date and time   History of Anesthesia Complications (+) PONV and history of anesthetic complications  Airway Mallampati: I  TM Distance: >3 FB Neck ROM: Full    Dental  (+) Edentulous Upper, Poor Dentition, Missing, Dental Advisory Given   Pulmonary COPD COPD inhaler, Current Smoker,  breath sounds clear to auscultation        Cardiovascular hypertension, Pt. on medications and Pt. on home beta blockers - anginaRhythm:Regular Rate:Normal     Neuro/Psych Depression Chronic back pain: narcotics MS    GI/Hepatic Neg liver ROS, GERD-  Medicated and Controlled,  Endo/Other  diabetes, Poorly Controlled, Type 1, Insulin DependentMorbid obesity  Renal/GU negative Renal ROS     Musculoskeletal   Abdominal (+) + obese,   Peds  Hematology negative hematology ROS (+) anemia ,   Anesthesia Other Findings   Reproductive/Obstetrics                            Anesthesia Physical  Anesthesia Plan  ASA: III  Anesthesia Plan:    Post-op Pain Management:    Induction: Intravenous  Airway Management Planned: Oral ETT  Additional Equipment:   Intra-op Plan:   Post-operative Plan: Extubation in OR  Informed Consent: I have reviewed the patients History and Physical, chart, labs and discussed the procedure including the risks, benefits and alternatives for the proposed anesthesia with the patient or authorized representative who has indicated his/her understanding and acceptance.   Dental advisory given  Plan Discussed with: CRNA and Surgeon  Anesthesia Plan Comments: (Plan routine monitors, GETA)        Anesthesia Quick Evaluation

## 2015-07-22 NOTE — Progress Notes (Signed)
Patient is gone to OR at this time.

## 2015-07-23 ENCOUNTER — Encounter (HOSPITAL_COMMUNITY): Payer: Self-pay | Admitting: Neurosurgery

## 2015-07-23 LAB — GLUCOSE, CAPILLARY
Glucose-Capillary: 210 mg/dL — ABNORMAL HIGH (ref 65–99)
Glucose-Capillary: 223 mg/dL — ABNORMAL HIGH (ref 65–99)
Glucose-Capillary: 115 mg/dL — ABNORMAL HIGH (ref 65–99)
Glucose-Capillary: 247 mg/dL — ABNORMAL HIGH (ref 65–99)

## 2015-07-23 MED ORDER — MAGNESIUM CITRATE PO SOLN
1.0000 | Freq: Once | ORAL | Status: AC
  Administered 2015-07-23: 1 via ORAL
  Filled 2015-07-23: qty 296

## 2015-07-23 MED ORDER — PANTOPRAZOLE SODIUM 40 MG PO TBEC
40.0000 mg | DELAYED_RELEASE_TABLET | Freq: Every day | ORAL | Status: DC
  Administered 2015-07-23: 40 mg via ORAL
  Filled 2015-07-23: qty 1

## 2015-07-23 MED ORDER — BISACODYL 10 MG RE SUPP
10.0000 mg | Freq: Once | RECTAL | Status: AC
  Administered 2015-07-23: 10 mg via RECTAL
  Filled 2015-07-23: qty 1

## 2015-07-23 NOTE — Progress Notes (Signed)
PT Cancellation Note  Patient Details Name: Angela Solomon MRN: [id number] DOB: 09-01-1976   Cancelled Treatment:    Reason Eval/Treat Not Completed: Medical issues which prohibited therapy pt on bedrest. Will follow up next available time to perform PT evaluation once activity orders are increased.   [place] 07/23/2015, 9:07 AM Wray Kearns, PT, DPT [phone]

## 2015-07-23 NOTE — Evaluation (Signed)
Physical Therapy Evaluation Patient Details Name: Angela Solomon MRN: [id number] DOB: 1976-04-08 Today's Date: 07/23/2015   History of Present Illness  Patient is a 39 y/o female s/p wound exploration with irrigation and reclosure 7/28. PMH includes lumbar laminectomy 06/29/15, MS, HTN, neuropathy, DM, depression.  Clinical Impression  Patient presents with generalized weakness and balance deficits s/p above surgery impacting mobility. Pt with hx of neuropathy affecting sensation in BLEs. Pt seems very down about recent passing of mother and being in hospital again. May benefit talking with someone-chaplain? Pt will have support at home from family. Encouraged OOB as tolerated. Reviewed back precautions. Will follow acutely to maximize independence and mobility prior to return home.     Follow Up Recommendations Home health PT;Supervision for mobility/OOB    Equipment Recommendations  None recommended by PT    Recommendations for Other Services OT consult     Precautions / Restrictions Precautions Precautions: Back;Fall Precaution Booklet Issued: No Precaution Comments: Pt able to recall 3/3 back precautions from prior surgery. Restrictions Weight Bearing Restrictions: No      Mobility  Bed Mobility Overal bed mobility: Modified Independent                Transfers Overall transfer level: Needs assistance Equipment used: Rolling walker (2 wheeled) Transfers: Sit to/from Stand Sit to Stand: Min guard         General transfer comment: Cues for hand placement and to maintain back precautions.  Ambulation/Gait Ambulation/Gait assistance: Min guard Ambulation Distance (Feet): 75 Feet (x2 bouts) Assistive device: Rolling walker (2 wheeled) Gait Pattern/deviations: Step-through pattern;Decreased stride length;Trunk flexed   Gait velocity interpretation: Below normal speed for age/gender General Gait Details: Pt with slow and mildly unsteady gait most likely due to  impaired sensation BLEs. Required seated rest break and cues for RW management/safety.  Stairs            Wheelchair Mobility    Modified Rankin (Stroke Patients Only)       Balance Overall balance assessment: Needs assistance Sitting-balance support: Feet supported;No upper extremity supported Sitting balance-Leahy Scale: Good     Standing balance support: During functional activity Standing balance-Leahy Scale: Poor Standing balance comment: Relient on RW for support.                              Pertinent Vitals/Pain Pain Assessment: No/denies pain    Home Living Family/patient expects to be discharged to:: Private residence Living Arrangements: Spouse/significant other;Children Available Help at Discharge: Family;Available 24 hours/day Type of Home: House Home Access: Stairs to enter Entrance Stairs-Rails: Can reach both;Left;Right Entrance Stairs-Number of Steps: 2 Home Layout: One level Home Equipment: Walker - 4 wheels;Cane - quad;Cane - single point      Prior Function Level of Independence: Independent with assistive device(s)         Comments: pt using cane for ambulation PTA.  Family assists with IADLs. Pt seems very sad about recent passing of mother and being in the hospital for another back surgery.     Hand Dominance        Extremity/Trunk Assessment   Upper Extremity Assessment: Defer to OT evaluation           Lower Extremity Assessment: Generalized weakness;RLE deficits/detail;LLE deficits/detail         Communication   Communication: No difficulties  Cognition Arousal/Alertness: Awake/alert Behavior During Therapy: WFL for tasks assessed/performed Overall Cognitive Status: Within Functional Limits for tasks  assessed                      General Comments General comments (skin integrity, edema, etc.): Encouraged pt to get up and mobility every 30-45 minutes to decrease stiffness/soreness.     Exercises        Assessment/Plan    PT Assessment Patient needs continued PT services  PT Diagnosis Difficulty walking;Generalized weakness   PT Problem List Decreased strength;Decreased activity tolerance;Decreased balance;Decreased mobility;Impaired sensation  PT Treatment Interventions Balance training;Gait training;Stair training;Functional mobility training;Therapeutic activities;Therapeutic exercise;Patient/family education   PT Goals (Current goals can be found in the Care Plan section) Acute Rehab PT Goals Patient Stated Goal: to go home PT Goal Formulation: With patient Time For Goal Achievement: 08/06/15 Potential to Achieve Goals: Good    Frequency Min 5X/week   Barriers to discharge        Co-evaluation               End of Session Equipment Utilized During Treatment: Gait belt Activity Tolerance: Patient tolerated treatment well Patient left: in bed;with call bell/phone within reach;with bed alarm set;with SCD's reapplied Nurse Communication: Mobility status         Time: 8828-0034 PT Time Calculation (min) (ACUTE ONLY): 21 min   Charges:   PT Evaluation $Initial PT Evaluation Tier I: 1 Procedure     PT G Codes:        Jeraldin Fesler A Schawn Byas 07/23/2015, 11:52 AM Wray Kearns, [place], DPT [phone]

## 2015-07-23 NOTE — Evaluation (Signed)
Occupational Therapy Evaluation and Discharge Patient Details Name: Angela Solomon MRN: [id number] DOB: 05-15-1976 Today's Date: 07/23/2015    History of Present Illness Patient is a 39 y/o female s/p wound exploration with irrigation and reclosure 7/28. PMH includes lumbar laminectomy 06/29/15, MS, HTN, neuropathy, DM, depression.   Clinical Impression   This 39 yo female admitted and underwent above presents to acute OT with increased pain (but better than when she came in per her report) and VCs needed for consistency with back precautions--per pt someone is in the house with her at all times and can A her prn. No further OT needs, we will sign off.    Follow Up Recommendations  No OT follow up    Equipment Recommendations  None recommended by OT       Precautions / Restrictions Precautions Precautions: Back;Fall Precaution Booklet Issued: No Precaution Comments: Pt able to recall 2/3 back precautions from prior surgery--needed gestural cue for no twisting Restrictions Weight Bearing Restrictions: No      Mobility Bed Mobility Overal bed mobility: Modified Independent                Transfers Overall transfer level: Needs assistance Equipment used: Rolling walker (2 wheeled) Transfers: Sit to/from Stand Sit to Stand: Supervision         General transfer comment: Cues for hand placement and to maintain back precautions.         ADL Overall ADL's : Needs assistance/impaired Eating/Feeding: Independent;Sitting   Grooming: Set up;Supervision/safety;Standing   Upper Body Bathing: Set up;Supervision/ safety;Sitting   Lower Body Bathing: Set up;Supervison/ safety;Sit to/from stand;Cueing for back precautions. Pt reports she  can cross legs to get to her feet   Upper Body Dressing : Set up;Supervision/safety;Sitting   Lower Body Dressing: Supervision/safety;Set up;Sit to/from stand;Cueing for back precautions--pt reports she can cross legs to get to her  feet   Toilet Transfer: Supervision/safety;Ambulation;RW (bed>recliner)   Toileting- Clothing Manipulation and Hygiene: Supervision/safety;Sit to/from stand               Vision Additional Comments: No change from baseline          Pertinent Vitals/Pain Pain Assessment: 0-10 Pain Score: 8  Pain Location: headach from top of head to base of neck Pain Descriptors / Indicators: Aching Pain Intervention(s): Monitored during session     Hand Dominance Right   Extremity/Trunk Assessment Upper Extremity Assessment Upper Extremity Assessment: Overall WFL for tasks assessed         Communication Communication Communication: No difficulties   Cognition Arousal/Alertness: Awake/alert Behavior During Therapy: WFL for tasks assessed/performed Overall Cognitive Status: Within Functional Limits for tasks assessed                                Home Living Family/patient expects to be discharged to:: Private residence Living Arrangements: Spouse/significant other;Children Available Help at Discharge: Family;Available 24 hours/day Type of Home: House Home Access: Stairs to enter [employer] of Steps: 2 Entrance Stairs-Rails: Can reach both;Left;Right Home Layout: One level     Bathroom Shower/Tub: Occupational psychologist: Standard     Home Equipment: Environmental consultant - 4 wheels;Cane - quad;Cane - single point          Prior Functioning/Environment Level of Independence: Independent with assistive device(s)        Comments: pt using cane for ambulation PTA.  Family assists with IADLs. Pt seems very sad about  recent passing of mother and being in the hospital for another back surgery.    OT Diagnosis: Generalized weakness;Acute pain         OT Goals(Current goals can be found in the care plan section) Acute Rehab OT Goals Patient Stated Goal: to go home  OT Frequency:                End of Session Equipment Utilized During  Treatment: Rolling walker  Activity Tolerance: Patient tolerated treatment well (but then did not want to stay up in recliner) Patient left: in chair;with call bell/phone within reach;with chair alarm set (nursing put her back to bed after I left room and pt did not want to stay up even with nurse encouarging her to)   Time: 1203-1218 OT Time Calculation (min): 15 min Charges:  OT General Charges $OT Visit: 1 Procedure OT Evaluation $Initial OT Evaluation Tier I: 1 Procedure  Almon Register [phone] 07/23/2015, 12:27 PM

## 2015-07-23 NOTE — Anesthesia Postprocedure Evaluation (Signed)
Anesthesia Post Note  Patient: Angela Solomon  Procedure(s) Performed: Procedure(s) (LRB): WOUND EXPLORATION (N/A)  Anesthesia type: General  Patient location: PACU  Post pain: Pain level controlled  Post assessment: Post-op Vital signs reviewed  Last Vitals: BP 120/71 mmHg  Pulse 100  Temp(Src) 36.9 C (Oral)  Resp 20  Ht 6\' 1"  (1.854 m)  Wt 222 lb 10.6 oz (101 kg)  BMI 29.38 kg/m2  SpO2 100%  LMP 06/08/2015  Post vital signs: Reviewed  Level of consciousness: sedated  Complications: No apparent anesthesia complications

## 2015-07-23 NOTE — Progress Notes (Signed)
Pt. Removed hemovac due to constant turning in the bed. Output 0 for my shift. Changed dressing as it was saturated. Will continue to monitor. Joaquin Bend E, RN 07/23/2015 12:49 AM

## 2015-07-23 NOTE — Progress Notes (Signed)
Patient ID: Angela Solomon, female   DOB: [birth year], 39 y.o.   MRN: [id number] Afeb, vss No new bneuro issues She is depressed and talking about going home but this mostly deals with her mothers recent passing. Her dressing has some drainage on it, but I am not sure what that could be. Her drain was inadvertently pulled out last night. Awaiting culture results. Will watch wound closely. There was no csf noted at surgery, and none felt to be present on MRI. She has no headache. Will continue present rx.

## 2015-07-24 LAB — GLUCOSE, CAPILLARY
GLUCOSE-CAPILLARY: 252 mg/dL — AB (ref 65–99)
Glucose-Capillary: 119 mg/dL — ABNORMAL HIGH (ref 65–99)

## 2015-07-24 MED ORDER — HEPARIN SOD (PORK) LOCK FLUSH 100 UNIT/ML IV SOLN
250.0000 [IU] | INTRAVENOUS | Status: AC | PRN
  Administered 2015-07-24: 250 [IU]

## 2015-07-24 MED ORDER — HYDROCODONE-ACETAMINOPHEN 10-325 MG PO TABS: 1.0000 | ORAL_TABLET | Freq: Four times a day (QID) | ORAL | Status: DC | PRN

## 2015-07-24 MED ORDER — SODIUM CHLORIDE 0.9 % IJ SOLN: 10.0000 mL | INTRAMUSCULAR | Status: DC | PRN

## 2015-07-24 NOTE — Progress Notes (Signed)
Patient ready for discharge to home; completed antibiotics scheduled this afternoon; PICC line flushed and capped for discharge to home by IV team; discharge instructions/Rx given and reviewed; belongings and rolling walker with seat returned to patient; case management and social worker completed discharge assistance to home; patient discharged out via wheelchair to a taxi ride with voucher to home.

## 2015-07-24 NOTE — Progress Notes (Signed)
PT Cancellation Note  Patient Details Name: Angela Solomon MRN: [id number] DOB: 1976-08-14   Cancelled Treatment:    Reason Eval/Treat Not Completed: Patient declined, no reason specified.  Patient reports she has been up ambulating in hallway today.  Reports she does not need to practice steps.  Patient declined PT today, reporting she is being discharged today.   Despina Pole 07/24/2015, 1:50 PM Carita Pian. Sanjuana Kava, [place] Pager [phone]

## 2015-07-24 NOTE — Progress Notes (Signed)
Subjective: Patient reports less drainage  Objective: Vital signs in last 24 hours: Temp:  [98 F (36.7 C)-100.4 F (38 C)] 99.1 F (37.3 C) (07/30 0535) Pulse Rate:  [90-118] 98 (07/30 0535) Resp:  [18-20] 18 (07/30 0535) BP: (82-116)/(55-70) 101/56 mmHg (07/30 0535) SpO2:  [94 %-100 %] 98 % (07/30 0535)  Intake/Output from previous day: 07/29 0701 - 07/30 0700 In: -  Out: 351 [Urine:350; Emesis/NG output:1] Intake/Output this shift:    Physical Exam: Bulky dressing, recently changed, is dry.  No complaints in legs with good strength.  Lab Results:  Recent Labs  07/21/15 1307  WBC 6.7  HGB 13.0  HCT 37.0  PLT 377   BMET  Recent Labs  07/21/15 1307  NA 129*  K 3.5  CL 89*  CO2 27  GLUCOSE 457*  BUN 9  CREATININE 0.69  CALCIUM 9.1    Studies/Results: No results found.  Assessment/Plan: Observe today.  Continue IV Vancomycin.  ?PICC line/ID consult.      LOS: 3 days    Peggyann Shoals, MD 07/24/2015, 7:13 AM

## 2015-07-24 NOTE — Discharge Summary (Signed)
Physician Discharge Summary  Patient ID: Angela Solomon MRN: [id number] DOB/AGE: 02-02-1976 39 y.o.  Admit date: 07/21/2015 Discharge date: 07/24/2015  Admission Diagnoses: wound dehiscence   Discharge Diagnoses: wound infection   Discharged Condition: stable  Hospital Course: The patient was admitted on 07/21/2015 and taken to the operating room where the patient underwent lumbar wound exploration. The patient tolerated the procedure well and was taken to the recovery room and then to the floor in stable condition. The hospital course was routine. There were no complications. The wound remained clean dry and intact. Pt had appropriate back soreness. No complaints of leg pain or new N/T/W. The patient remained afebrile with stable vital signs, and tolerated a regular diet. The patient continued to increase activities, and pain was well controlled with oral pain medications. The wound cultures did show GPC and GPR so HH was arranged to administer home IV abx. PICCline has been arranged, though patient has threatened to check out AMA. I have explained the risks of doing so.  Consults: None  Significant Diagnostic Studies:  Results for orders placed or performed during the hospital encounter of 07/21/15  Anaerobic culture  Result Value Ref Range   Specimen Description WOUND BACK    Special Requests LUMBAR WOUND    Gram Stain      RARE WBC PRESENT,BOTH PMN AND MONONUCLEAR NO SQUAMOUS EPITHELIAL CELLS SEEN FEW GRAM POSITIVE COCCI IN PAIRS IN CLUSTERS RARE GRAM POSITIVE RODS Performed at [facility]    Culture      NO ANAEROBES ISOLATED; CULTURE IN PROGRESS FOR 5 DAYS Performed at [facility]    Report Status PENDING   Wound culture  Result Value Ref Range   Specimen Description WOUND BACK    Special Requests LUMBAR WOUND    Gram Stain      RARE WBC PRESENT,BOTH PMN AND MONONUCLEAR NO SQUAMOUS EPITHELIAL CELLS SEEN FEW GRAM POSITIVE COCCI IN PAIRS IN CLUSTERS RARE  GRAM POSITIVE RODS Performed at [facility]    Culture      FEW STAPHYLOCOCCUS AUREUS Note: RIFAMPIN AND GENTAMICIN SHOULD NOT BE USED AS SINGLE DRUGS FOR TREATMENT OF STAPH INFECTIONS. Performed at [facility]    Report Status PENDING   CBC  Result Value Ref Range   WBC 6.7 4.0 - 10.5 K/uL   RBC 4.32 3.87 - 5.11 MIL/uL   Hemoglobin 13.0 12.0 - 15.0 g/dL   HCT 37.0 36.0 - 46.0 %   MCV 85.6 78.0 - 100.0 fL   MCH 30.1 26.0 - 34.0 pg   MCHC 35.1 30.0 - 36.0 g/dL   RDW 11.8 11.5 - 15.5 %   Platelets 377 150 - 400 K/uL  Basic metabolic panel  Result Value Ref Range   Sodium 129 (L) 135 - 145 mmol/L   Potassium 3.5 3.5 - 5.1 mmol/L   Chloride 89 (L) 101 - 111 mmol/L   CO2 27 22 - 32 mmol/L   Glucose, Bld 457 (H) 65 - 99 mg/dL   BUN 9 6 - 20 mg/dL   Creatinine, Ser 0.69 0.44 - 1.00 mg/dL   Calcium 9.1 8.9 - 10.3 mg/dL   GFR calc non Af Amer >60 >60 mL/min   GFR calc Af Amer >60 >60 mL/min   Anion gap 13 5 - 15  Sedimentation rate  Result Value Ref Range   Sed Rate 81 (H) 0 - 22 mm/hr  Glucose, capillary  Result Value Ref Range   Glucose-Capillary 447 (H) 65 -  99 mg/dL   Comment 1 Notify RN    Comment 2 Document in Chart   Hemoglobin A1c  Result Value Ref Range   Hgb A1c MFr Bld 12.3 (H) 4.8 - 5.6 %   Mean Plasma Glucose 306 mg/dL  Glucose, capillary  Result Value Ref Range   Glucose-Capillary 333 (H) 65 - 99 mg/dL   Comment 1 Notify RN    Comment 2 Document in Chart   Glucose, capillary  Result Value Ref Range   Glucose-Capillary 253 (H) 65 - 99 mg/dL   Comment 1 Notify RN    Comment 2 Document in Chart   Glucose, capillary  Result Value Ref Range   Glucose-Capillary 311 (H) 65 - 99 mg/dL   Comment 1 Notify RN    Comment 2 Document in Chart   Glucose, capillary  Result Value Ref Range   Glucose-Capillary 255 (H) 65 - 99 mg/dL   Comment 1 Notify RN    Comment 2 Document in Chart   Glucose, capillary  Result Value Ref Range    Glucose-Capillary 218 (H) 65 - 99 mg/dL   Comment 1 Notify RN    Comment 2 Document in Chart   Glucose, capillary  Result Value Ref Range   Glucose-Capillary 202 (H) 65 - 99 mg/dL  Glucose, capillary  Result Value Ref Range   Glucose-Capillary 313 (H) 65 - 99 mg/dL   Comment 1 Notify RN   Glucose, capillary  Result Value Ref Range   Glucose-Capillary 207 (H) 65 - 99 mg/dL   Comment 1 Notify RN    Comment 2 Document in Chart   Glucose, capillary  Result Value Ref Range   Glucose-Capillary 210 (H) 65 - 99 mg/dL   Comment 1 Notify RN    Comment 2 Document in Chart   Glucose, capillary  Result Value Ref Range   Glucose-Capillary 223 (H) 65 - 99 mg/dL  Glucose, capillary  Result Value Ref Range   Glucose-Capillary 115 (H) 65 - 99 mg/dL  Glucose, capillary  Result Value Ref Range   Glucose-Capillary 247 (H) 65 - 99 mg/dL   Comment 1 Notify RN    Comment 2 Document in Chart   Glucose, capillary  Result Value Ref Range   Glucose-Capillary 252 (H) 65 - 99 mg/dL    Dg Lumbar Spine 2-3 Views  06/29/2015   CLINICAL DATA:  Intraoperative fluoro spot films from a lumbar let laminectomy  EXAM: LUMBAR SPINE - 2-3 VIEW; DG C-ARM 61-120 MIN  COMPARISON:  MRI of the lumbar spine from an outside facility dated Apr 27, 2015  FINDINGS: Fluoro time reported is 14 seconds. Two spot films are reviewed. The lateral spot film timed at 9:32 a.m. on July 5th reveals a tissuesspreader device overlying the spinous process of L5. The AP view timed at 9:33 a.m. reveals the tissues Brenner device as well as a radiodense structure projecting at the L5 level.  IMPRESSION: Intraoperative fluoro spot films from a lower lumbar laminectomy. Further interpretation is deferred to Dr. Hal Neer.   Electronically Signed   By: Kirk Basquez  [place] M.D.   On: 06/29/2015 09:54   Mr Lumbar Spine W Wo Contrast  07/21/2015   CLINICAL DATA:  Evaluate for postoperative CSF leak.  EXAM: MRI LUMBAR SPINE WITHOUT AND WITH CONTRAST   TECHNIQUE: Multiplanar and multiecho pulse sequences of the lumbar spine were obtained without and with intravenous contrast.  CONTRAST:  20 mL MultiHance  COMPARISON:  None.  FINDINGS: The vertebral bodies of the  lumbar spine are normal in size. The vertebral bodies of the lumbar spine are normal in alignment. There is normal bone marrow signal demonstrated throughout the vertebra. There is degenerative disc disease at L4-5. There is a Coflex device at L4-5 in satisfactory position. There is surrounding edema and enhancement around the spinous processes and extending into the paraspinal musculature. There is no focal fluid collection.  The spinal cord is normal in signal and contour. The cord terminates normally at L1 . The nerve roots of the cauda equina and the filum terminale are normal.  The visualized portions of the SI joints are unremarkable.  The imaged intra-abdominal contents are unremarkable.  T12-L1: No significant disc bulge. No evidence of neural foraminal stenosis. No central canal stenosis.  L1-L2: Mild broad-based disc bulge. No evidence of neural foraminal stenosis. No central canal stenosis.  L2-L3: No significant disc bulge. No evidence of neural foraminal stenosis. No central canal stenosis.  L3-L4: Mild broad-based disc bulge. Mild bilateral facet arthropathy. Mild spinal stenosis. No evidence of neural foraminal stenosis.  L4-L5: Interval discectomy with mild granulation tissue along the left lateral aspect of the thecal sac. Small area of osseous ridging along the left paracentral and foraminal aspect. No evidence of neural foraminal stenosis. No central canal stenosis.  L5-S1: No significant disc bulge. No evidence of neural foraminal stenosis. No central canal stenosis. Mild left facet arthropathy.  IMPRESSION: 1. There is a Coflex device at L4-5 in satisfactory position. There is surrounding edema and enhancement around the spinous processes and extending into the paraspinal musculature  likely reflecting postsurgical changes. There is no focal fluid collection in the paraspinal soft tissues to suggest a CSF leak. 2. Interval discectomy at L4-5 with mild granulation tissue along the left lateral aspect of the thecal sac. Small area of osseous ridging along the left paracentral and foraminal aspect. No recurrent foraminal or central canal stenosis.   Electronically Signed   By: Kathreen Devoid   On: 07/21/2015 17:38   Dg C-arm 61-120 Min  06/29/2015   CLINICAL DATA:  Intraoperative fluoro spot films from a lumbar let laminectomy  EXAM: LUMBAR SPINE - 2-3 VIEW; DG C-ARM 61-120 MIN  COMPARISON:  MRI of the lumbar spine from an outside facility dated Apr 27, 2015  FINDINGS: Fluoro time reported is 14 seconds. Two spot films are reviewed. The lateral spot film timed at 9:32 a.m. on July 5th reveals a tissuesspreader device overlying the spinous process of L5. The AP view timed at 9:33 a.m. reveals the tissues Brenner device as well as a radiodense structure projecting at the L5 level.  IMPRESSION: Intraoperative fluoro spot films from a lower lumbar laminectomy. Further interpretation is deferred to Dr. Hal Neer.   Electronically Signed   By: Glenard Keesling  [place] M.D.   On: 06/29/2015 09:54    Antibiotics:  Anti-infectives    Start     Dose/Rate Route Frequency Ordered Stop   07/22/15 2200  vancomycin (VANCOCIN) 1,250 mg in sodium chloride 0.9 % 250 mL IVPB  Status:  Discontinued     1,250 mg 166.7 mL/hr over 90 Minutes Intravenous  Once 07/22/15 1324 07/22/15 1339   07/22/15 1430  cefTRIAXone (ROCEPHIN) 1 g in dextrose 5 % 50 mL IVPB - Premix     1 g 100 mL/hr over 30 Minutes Intravenous Every 24 hours 07/22/15 1316     07/22/15 1400  vancomycin (VANCOCIN) IVPB 1000 mg/200 mL premix     1,000 mg 200 mL/hr over 60 Minutes Intravenous  Every 8 hours 07/22/15 1342     07/22/15 1055  vancomycin (VANCOCIN) powder  Status:  Discontinued       As needed 07/22/15 1112 07/22/15 1112   07/22/15 0937   vancomycin (VANCOCIN) 1000 MG powder    Comments:  Bryna Colander   : cabinet override      07/22/15 0937 07/22/15 2144   07/21/15 1200  vancomycin (VANCOCIN) IVPB 1000 mg/200 mL premix  Status:  Discontinued     1,000 mg 200 mL/hr over 60 Minutes Intravenous Every 12 hours 07/21/15 1148 07/22/15 1316   07/21/15 1200  cefTRIAXone (ROCEPHIN) 1 g in dextrose 5 % 50 mL IVPB - Premix  Status:  Discontinued    Comments:  First dose now!!   1 g 100 mL/hr over 30 Minutes Intravenous Every 24 hours 07/21/15 1148 07/22/15 1316      Discharge Exam: Blood pressure 101/56, pulse 98, temperature 99.1 F (37.3 C), temperature source Oral, resp. rate 18, height 6\' 1"  (1.854 m), weight 222 lb 10.6 oz (101 kg), last menstrual period 06/08/2015, SpO2 98 %. Neurologic: Grossly normal Dressing dry  Discharge Medications:     Medication List    STOP taking these medications        clindamycin 150 MG capsule  Commonly known as:  CLEOCIN     methylPREDNISolone 4 MG Tbpk tablet  Commonly known as:  MEDROL DOSEPAK      TAKE these medications        acetaminophen 500 MG tablet  Commonly known as:  TYLENOL  Take 1,000 mg by mouth every 6 (six) hours as needed for mild pain or moderate pain.     albuterol 108 (90 BASE) MCG/ACT inhaler  Commonly known as:  PROVENTIL HFA;VENTOLIN HFA  Inhale 1 puff into the lungs as needed for wheezing or shortness of breath.     amitriptyline 25 MG tablet  Commonly known as:  ELAVIL  Take 50 mg by mouth at bedtime.     B-D INS SYR ULTRAFINE 1CC/31G 31G X 5/16" 1 ML Misc  Generic drug:  Insulin Syringe-Needle U-100  See admin instructions.     carvedilol 25 MG tablet  Commonly known as:  COREG  Take 25 mg by mouth 2 (two) times daily.     citalopram 20 MG tablet  Commonly known as:  CELEXA  Take 20 mg by mouth daily.     cyclobenzaprine 10 MG tablet  Commonly known as:  FLEXERIL  Take 1 tablet (10 mg total) by mouth 3 (three) times daily as needed for  muscle spasms.     diazepam 10 MG tablet  Commonly known as:  VALIUM  Take 1 tablet (10 mg total) by mouth at bedtime as needed for anxiety.     ferrous sulfate 325 (65 FE) MG tablet  Take 325 mg by mouth daily.     fluticasone 50 MCG/ACT nasal spray  Commonly known as:  FLONASE  Place 2 sprays into both nostrils as needed for allergies.     gabapentin 400 MG capsule  Commonly known as:  NEURONTIN  Take 800 mg by mouth 4 (four) times daily.     HYDROcodone-acetaminophen 10-325 MG per tablet  Commonly known as:  NORCO  Take 1 tablet by mouth every 6 (six) hours as needed for moderate pain.     indomethacin 25 MG capsule  Commonly known as:  INDOCIN  Take 1 capsule (25 mg total) by mouth 3 (three) times daily as needed.  interferon beta-1a 30 MCG injection  Commonly known as:  AVONEX  Inject 30 mcg into the muscle every 7 (seven) days.     LEVEMIR 100 UNIT/ML injection  Generic drug:  insulin detemir  Inject 55-70 Units into the skin 2 (two) times daily. 55 units in the morning and 70 units at night     losartan-hydrochlorothiazide 100-25 MG per tablet  Commonly known as:  HYZAAR  Take 1 tablet by mouth daily.     NOVOLIN R 100 units/mL injection  Generic drug:  insulin regular  Inject 15-20 Units into the skin daily as needed for high blood sugar. Sliding scale: 300-400=20 units, 200-300=15 units (pt states her glucose level is never any lower)     pantoprazole 40 MG tablet  Commonly known as:  PROTONIX  Take 40 mg by mouth at bedtime.     promethazine 25 MG tablet  Commonly known as:  PHENERGAN  Take 1 tablet (25 mg total) by mouth every 6 (six) hours as needed for nausea or vomiting.        Disposition: home with HH abx   Final Dx: lumbar wound infection      Discharge Instructions    Call MD for:  difficulty breathing, headache or visual disturbances    Complete by:  As directed      Call MD for:  persistant nausea and vomiting    Complete by:  As  directed      Call MD for:  redness, tenderness, or signs of infection (pain, swelling, redness, odor or green/yellow discharge around incision site)    Complete by:  As directed      Call MD for:  severe uncontrolled pain    Complete by:  As directed      Call MD for:  temperature >100.4    Complete by:  As directed      Change dressing (specify)    Complete by:  As directed   Dressing change: as needed     Diet - low sodium heart healthy    Complete by:  As directed      Discharge instructions    Complete by:  As directed   No strenuous activity     Increase activity slowly    Complete by:  As directed            Follow-up Information    Follow up with Faythe Ghee, MD. Schedule an appointment as soon as possible for a visit in 2 weeks.   Specialty:  Neurosurgery   Contact information:   [address] [place] [address] [phone]        Signed: Eustace Moore 07/24/2015, 9:22 AM

## 2015-07-24 NOTE — Progress Notes (Signed)
ANTIBIOTIC CONSULT NOTE - Follow -Up  Pharmacy Consult for Vancomycin Indication: post-op spinal surgery  Allergies  Allergen Reactions  . Glatiramer Anaphylaxis    Generic Copaxone  . Lisinopril Anaphylaxis  . Morphine And Related Hives  . Aspirin Nausea And Vomiting    Fevers and GI upset  . Hydrocodone-Acetaminophen Nausea And Vomiting    Pt states can take Hydrocodone but has a reaction to a additive in vicodin  . Zithromax [Azithromycin] Rash    Patient Measurements: Height: 6\' 1"  (185.4 cm) Weight: 222 lb 10.6 oz (101 kg) IBW/kg (Calculated) : 75.4 Adjusted Body Weight:    Vital Signs: Temp: 99.1 F (37.3 C) (07/30 0535) Temp Source: Oral (07/30 0535) BP: 101/56 mmHg (07/30 0535) Pulse Rate: 98 (07/30 0535) Intake/Output from previous day: 07/29 0701 - 07/30 0700 In: -  Out: 351 [Urine:350; Emesis/NG output:1] Intake/Output from this shift:    Labs:  Recent Labs  07/21/15 1307  WBC 6.7  HGB 13.0  PLT 377  CREATININE 0.69   Estimated Creatinine Clearance: 126.3 mL/min (by C-G formula based on Cr of 0.69). No results for input(s): VANCOTROUGH, VANCOPEAK, VANCORANDOM, GENTTROUGH, GENTPEAK, GENTRANDOM, TOBRATROUGH, TOBRAPEAK, TOBRARND, AMIKACINPEAK, AMIKACINTROU, AMIKACIN in the last 72 hours.   Microbiology: Recent Results (from the past 720 hour(s))  Anaerobic culture     Status: None (Preliminary result)   Collection Time: 07/22/15 10:28 AM  Result Value Ref Range Status   Specimen Description WOUND BACK  Final   Special Requests LUMBAR WOUND  Final   Gram Stain   Final    RARE WBC PRESENT,BOTH PMN AND MONONUCLEAR NO SQUAMOUS EPITHELIAL CELLS SEEN FEW GRAM POSITIVE COCCI IN PAIRS IN CLUSTERS RARE GRAM POSITIVE RODS Performed at [facility]    Culture   Final    NO ANAEROBES ISOLATED; CULTURE IN PROGRESS FOR 5 DAYS Performed at [facility]    Report Status PENDING  Incomplete  Wound culture     Status: None (Preliminary  result)   Collection Time: 07/22/15 10:28 AM  Result Value Ref Range Status   Specimen Description WOUND BACK  Final   Special Requests LUMBAR WOUND  Final   Gram Stain   Final    RARE WBC PRESENT,BOTH PMN AND MONONUCLEAR NO SQUAMOUS EPITHELIAL CELLS SEEN FEW GRAM POSITIVE COCCI IN PAIRS IN CLUSTERS RARE GRAM POSITIVE RODS Performed at [facility]    Culture   Final    FEW STAPHYLOCOCCUS AUREUS Note: RIFAMPIN AND GENTAMICIN SHOULD NOT BE USED AS SINGLE DRUGS FOR TREATMENT OF STAPH INFECTIONS. Performed at [facility]    Report Status PENDING  Incomplete    Medical History: Past Medical History  Diagnosis Date  . Multiple sclerosis   . Headache   . Hypertension   . Neuropathy   . Vision abnormalities   . PONV (postoperative nausea and vomiting)     sometimes...........  Marland Kitchen Diabetes mellitus without complication     dx at age 55  type 69  . Depression   . GERD (gastroesophageal reflux disease)   . Neuromuscular disorder     dx 2003 with MS  . Anemia     Medications:  Prescriptions prior to admission  Medication Sig Dispense Refill Last Dose  . amitriptyline (ELAVIL) 25 MG tablet Take 50 mg by mouth at bedtime.   5 07/20/2015 at Unknown time  . carvedilol (COREG) 25 MG tablet Take 25 mg by mouth 2 (two) times daily.    07/21/2015 at [place]  .  citalopram (CELEXA) 20 MG tablet Take 20 mg by mouth daily.    07/20/2015 at Unknown time  . clindamycin (CLEOCIN) 150 MG capsule Take 150 mg by mouth 4 (four) times daily.  0 07/21/2015 at Unknown time  . cyclobenzaprine (FLEXERIL) 10 MG tablet Take 1 tablet (10 mg total) by mouth 3 (three) times daily as needed for muscle spasms. 30 tablet 1 Past Month at Unknown time  . diazepam (VALIUM) 10 MG tablet Take 1 tablet (10 mg total) by mouth at bedtime as needed for anxiety. 30 tablet 1 Past Week at Unknown time  . ferrous sulfate 325 (65 FE) MG tablet Take 325 mg by mouth daily.    07/21/2015 at Unknown time  . gabapentin  (NEURONTIN) 400 MG capsule Take 800 mg by mouth 4 (four) times daily.    07/20/2015 at Unknown time  . insulin detemir (LEVEMIR) 100 UNIT/ML injection Inject 55-70 Units into the skin 2 (two) times daily. 55 units in the morning and 70 units at night   07/20/2015 at Unknown time  . losartan-hydrochlorothiazide (HYZAAR) 100-25 MG per tablet Take 1 tablet by mouth daily.    07/21/2015 at Unknown time  . methylPREDNISolone (MEDROL DOSEPAK) 4 MG TBPK tablet Take 4 mg by mouth daily as needed (with percocet).   0 07/21/2015 at Unknown time  . NOVOLIN R 100 UNIT/ML injection Inject 15-20 Units into the skin daily as needed for high blood sugar. Sliding scale: 300-400=20 units, 200-300=15 units (pt states her glucose level is never any lower)  0 07/20/2015 at Unknown time  . pantoprazole (PROTONIX) 40 MG tablet Take 40 mg by mouth at bedtime.    07/20/2015 at Unknown time  . promethazine (PHENERGAN) 25 MG tablet Take 1 tablet (25 mg total) by mouth every 6 (six) hours as needed for nausea or vomiting. 30 tablet 1 07/20/2015 at Unknown time  . acetaminophen (TYLENOL) 500 MG tablet Take 1,000 mg by mouth every 6 (six) hours as needed for mild pain or moderate pain.   07/16/2015  . albuterol (PROVENTIL HFA;VENTOLIN HFA) 108 (90 BASE) MCG/ACT inhaler Inhale 1 puff into the lungs as needed for wheezing or shortness of breath.   1 year  . B-D INS SYR ULTRAFINE 1CC/31G 31G X 5/16" 1 ML MISC See admin instructions.  0   . fluticasone (FLONASE) 50 MCG/ACT nasal spray Place 2 sprays into both nostrils as needed for allergies.    Nov 2015  . indomethacin (INDOCIN) 25 MG capsule Take 1 capsule (25 mg total) by mouth 3 (three) times daily as needed. (Patient taking differently: Take 25 mg by mouth 3 (three) times daily as needed for mild pain. ) 15 capsule 0 07/19/2015  . interferon beta-1a (AVONEX) 30 MCG injection Inject 30 mcg into the muscle every 7 (seven) days. (Patient taking differently: Inject 30 mcg into the muscle every 7  (seven) days. On thursday) 12 each 3 07/08/2015  . [DISCONTINUED] HYDROcodone-acetaminophen (NORCO) 10-325 MG per tablet Take 1 tablet by mouth every 6 (six) hours as needed. (Patient taking differently: Take 1 tablet by mouth every 6 (six) hours as needed for moderate pain. ) 90 tablet 0 07/18/2015   Assessment: 39 y/o F with a lumbar decompression and coflex about 3 weeks ago presents with wound drainage and headache. 7/28 pt underwent exploration of lumbar wound with irrigation and reclosure with placement of a drain. Na low at 129, Scr ok 0.69 with CrCl >100. Plan to start post-op Vancomycin while drain remains in.  Vancomycin 1g/12h ordere by Dr. Hal Neer 7/27 with dose given at 1800 and midnight? No early morning pre-op abx noted. Patient is currently also on Rocephin.  Pt going home today.  Ideally, would like to check a vancomycin trough level prior to discharge, but patient leaving shortly.  Kinetically probably needs dosing every eight hours, but not very convenient for home dosing.  Goal of Therapy:  Vancomycin trough level 10-15 mcg/ml  Plan:  Recommend at least vancomycin 1500 mg q 12 hrs at discharge. Home health to follow and adjust dose as needed.  Uvaldo Rising, BCPS  Clinical Pharmacist Pager [phone]  07/24/2015 10:52 AM

## 2015-07-24 NOTE — Care Management Note (Signed)
Case Management Note  Patient Details  Name: Angela Solomon MRN: [id number] Date of Birth: 1976/09/07  Subjective/Objective:                  Exploration of lumbar wound with irrigation and reclosure  Action/Plan:  Discharge planning Expected Discharge Date:  07/24/15               Expected Discharge Plan:  [place]  In-House Referral:     Discharge planning Services  CM Consult  Post Acute Care Choice:  Home Health Choice offered to:  Patient  DME Arranged:  IV pump/equipment DME Agency:  [place] Arranged:  RN, IV Antibiotics HH Agency:  [place]  Status of Service:  Completed, signed off  Medicare Important Message Given:    Date Medicare IM Given:    Medicare IM give by:    Date Additional Medicare IM Given:    Additional Medicare Important Message give by:     If discussed at [place] of Stay Meetings, dates discussed:    Additional Comments: CM met with pt in room to offer choice of home health agency.  Pt has had [facility] in the past and wishes to have them provide [facility] services.  Pt to have PICC placed this am.  Referral called to [facility] rep, Tiffany.  Prescription faxed to [facility] pharmacy.  CM called CSW to please arrange for taxi home post PICC placement and running of today's IV ABX.  No other CM needs were communicated. Dellie Catholic, RN 07/24/2015, 9:45 AM

## 2015-07-24 NOTE — Progress Notes (Signed)
Peripherally Inserted Central Catheter/Midline Placement  The IV Nurse has discussed with the patient and/or persons authorized to consent for the patient, the purpose of this procedure and the potential benefits and risks involved with this procedure.  The benefits include less needle sticks, lab draws from the catheter and patient may be discharged home with the catheter.  Risks include, but not limited to, infection, bleeding, blood clot (thrombus formation), and puncture of an artery; nerve damage and irregular heat beat.  Alternatives to this procedure were also discussed.  PICC/Midline Placement Documentation  PICC / Midline Single Lumen 50/27/74 PICC Right Basilic 44 cm 2 cm (Active)  Indication for Insertion or Continuance of Line Home intravenous therapies (PICC only) 07/24/2015 12:44 PM  Exposed Catheter (cm) 2 cm 07/24/2015 12:44 PM  Site Assessment Clean;Dry;Intact 07/24/2015 12:44 PM  Line Status Flushed;Saline locked;Blood return noted 07/24/2015 12:44 PM  Dressing Type Transparent 07/24/2015 12:44 PM  Dressing Status Clean;Dry;Intact;Antimicrobial disc in place 07/24/2015 12:44 PM  Line Care Connections checked and tightened 07/24/2015 12:44 PM  Line Adjustment (NICU/IV Team Only) No 07/24/2015 12:44 PM  Dressing Intervention New dressing 07/24/2015 12:44 PM  Dressing Change Due 07/31/15 07/24/2015 12:44 PM       Rolena Infante 07/24/2015, 12:45 PM

## 2015-07-26 LAB — WOUND CULTURE

## 2015-07-26 LAB — ANAEROBIC CULTURE

## 2015-07-27 ENCOUNTER — Ambulatory Visit: Payer: Medicaid Other | Admitting: Neurology

## 2015-08-03 ENCOUNTER — Ambulatory Visit (INDEPENDENT_AMBULATORY_CARE_PROVIDER_SITE_OTHER): Payer: Medicaid Other | Admitting: Neurology

## 2015-08-03 ENCOUNTER — Encounter: Payer: Self-pay | Admitting: Neurology

## 2015-08-03 VITALS — BP 102/66 | HR 78 | Resp 18 | Ht 73.0 in | Wt 235.0 lb

## 2015-08-03 DIAGNOSIS — E1142 Type 2 diabetes mellitus with diabetic polyneuropathy: Secondary | ICD-10-CM | POA: Diagnosis not present

## 2015-08-03 DIAGNOSIS — R208 Other disturbances of skin sensation: Secondary | ICD-10-CM | POA: Diagnosis not present

## 2015-08-03 DIAGNOSIS — R3915 Urgency of urination: Secondary | ICD-10-CM | POA: Diagnosis not present

## 2015-08-03 DIAGNOSIS — F331 Major depressive disorder, recurrent, moderate: Secondary | ICD-10-CM

## 2015-08-03 DIAGNOSIS — M5416 Radiculopathy, lumbar region: Secondary | ICD-10-CM | POA: Diagnosis not present

## 2015-08-03 DIAGNOSIS — R5382 Chronic fatigue, unspecified: Secondary | ICD-10-CM | POA: Diagnosis not present

## 2015-08-03 DIAGNOSIS — G35 Multiple sclerosis: Secondary | ICD-10-CM

## 2015-08-03 NOTE — Progress Notes (Signed)
[facility]  PATIENT: Angela Solomon DOB: [birth year]  REFERRING CLINICIAN: Dr. Rita Ohara  HISTORY FROM: patient REASON FOR VISIT: MS   HISTORICAL  CHIEF COMPLAINT:  Chief Complaint  Patient presents with  . Multiple Sclerosis    Sts. she has been out of Avonex for 2 weeks.  She would like to discuss an oral med.  Sts. she contines to have difficulty with memory.  Sts. she has decreased grip in both hands.  Sts. back pain is 100% resolved since back surgery (Dr. Hal Neer at [facility]  Her gait is better as well since surgery, and she is walking without assistance today./fim    HISTORY OF PRESENT ILLNESS:  Angela Solomon is a 39 year old woman with relapsing remitting multiple sclerosis.  She is currently taking Avonex and is currently out and needs a refill.    LBP/leg pain:   She feels much better since her surgery last month.    She still has some LBP but her leg is doing much better.     Gait/strength/sensation:   She feels gait is wobbly but is now able to stand straight up as her pain is better.    She does not need a cane.   Strength is ok but she fatigues easily.    She has some numbness in both feet.   She has diabetes and has a superimposed diabetic polyneuropathy.   Amitriptyline and gabapentin helps her dysesthesia.     Bladder:   She feels she is doing well.   She has some frequency and urgency but no incontinence.  Vision:   She is doing well and denies MS related vision problems.   Her eye doctor told her she might have increase pressure but drops were not prescribed.  Fatigue/sleep:  She is always sleepy and always tired.    She dozed off before AI entered the room.   She is sleeping 12 hours many days.   She is still on some hydrocodone and takes valium at bedtime.     She snores but no witnessed OSA  Mood/Cognition:   She notes depression and is on citalopram.  Her mother just died recently.     She notes decreased focus but otherwise cognition is  doing ok.     Ear pain:   She reports left ear pain and has tenderness under and behind the left ear.      MS History:   She presented in 2003 with gait ataxia, slurred speech and right sided paresthesias. Imaging studies were performed and she was diagnosed with multiple sclerosis. Initially, she was placed on Rebif but had skin reactions and switched to Copaxone. On Copaxone, she had an immediate hypersensitivity response and it was discontinued. She has been on Avonex most of the time but has been off for months at a time when she has had insurance difficulties.      Review of MRI's: 1. MRI of the cervical spine dated 05/20/2014. It showed enhancing spinal cord lesions consistent with MS. C2-C3 there was a left lateral focus. Also at C2-C3 there was a posterior focus. At C3-C4 there was a right posterior lateral focus. At C4 there was a dorsal focus and at C5 there was another dorsal focus. 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 There are multilevel degenerative changes most pronounced at C7-T1 where there could be dynamic impingement of the right C8 nerve root.  2. MRI of the brain dated 09/28/2013 showing diffuse white matter foci  in the periventricular and deep white matter consistent with [place].  Although there were no enhancing foci, when compared to a previous MRI dated 10/11/2010, there was some progression.  3. An MRI of the lumbar spine dated 04/27/2015 showed a large disc extrusion with sequestered fragment causing bilateral L5 nerve root compression.   4. MRI of the brain with and without contrast dated 10/16/2008 showed enhancing lesions.  She was reportedly off medication at that time.   REVIEW OF SYSTEMS:  Constitutional: No fevers, chills, sweats, or change in appetite.   Fatigue, poor sleep Eyes: No visual changes, double vision, eye pain Ear, nose and throat: No hearing loss, ear pain, nasal congestion, sore throat Cardiovascular: No chest pain, palpitations Respiratory:  No  shortness of breath at rest or with exertion.   No wheezes GastrointestinaI: No nausea, vomiting, diarrhea, abdominal pain, fecal incontinence Genitourinary:  No dysuria, urinary retention or frequency.  No nocturia. Musculoskeletal:  as above. Integumentary: No rash, pruritus, skin lesions Neurological: as above Psychiatric: No depression at this time.  No anxiety Endocrine: No palpitations, diaphoresis, change in appetite, change in weigh or increased thirst Hematologic/Lymphatic:  No anemia, purpura, petechiae. Allergic/Immunologic: No itchy/runny eyes, nasal congestion, recent allergic reactions, rashes  ALLERGIES: Allergies  Allergen Reactions  . Glatiramer Anaphylaxis    Generic Copaxone  . Lisinopril Anaphylaxis  . Morphine And Related Hives  . Aspirin Nausea And Vomiting    Fevers and GI upset  . Hydrocodone-Acetaminophen Nausea And Vomiting    Pt states can take Hydrocodone but has a reaction to a additive in vicodin  . Zithromax [Azithromycin] Rash    HOME MEDICATIONS: Outpatient Prescriptions Prior to Visit  Medication Sig Dispense Refill  . acetaminophen (TYLENOL) 500 MG tablet Take 1,000 mg by mouth every 6 (six) hours as needed for mild pain or moderate pain.    Marland Kitchen amitriptyline (ELAVIL) 25 MG tablet Take 50 mg by mouth at bedtime.   5  . B-D INS SYR ULTRAFINE 1CC/31G 31G X 5/16" 1 ML MISC See admin instructions.  0  . carvedilol (COREG) 25 MG tablet Take 25 mg by mouth 2 (two) times daily.     . citalopram (CELEXA) 20 MG tablet Take 20 mg by mouth daily.     . cyclobenzaprine (FLEXERIL) 10 MG tablet Take 1 tablet (10 mg total) by mouth 3 (three) times daily as needed for muscle spasms. 30 tablet 1  . diazepam (VALIUM) 10 MG tablet Take 1 tablet (10 mg total) by mouth at bedtime as needed for anxiety. 30 tablet 1  . ferrous sulfate 325 (65 FE) MG tablet Take 325 mg by mouth daily.     . fluticasone (FLONASE) 50 MCG/ACT nasal spray Place 2 sprays into both nostrils as  needed for allergies.     Marland Kitchen gabapentin (NEURONTIN) 400 MG capsule Take 800 mg by mouth 4 (four) times daily.     Marland Kitchen HYDROcodone-acetaminophen (NORCO) 10-325 MG per tablet Take 1 tablet by mouth every 6 (six) hours as needed for moderate pain. 90 tablet 0  . indomethacin (INDOCIN) 25 MG capsule Take 1 capsule (25 mg total) by mouth 3 (three) times daily as needed. (Patient taking differently: Take 25 mg by mouth 3 (three) times daily as needed for mild pain. ) 15 capsule 0  . insulin detemir (LEVEMIR) 100 UNIT/ML injection Inject 55-70 Units into the skin 2 (two) times daily. 55 units in the morning and 70 units at night    . losartan-hydrochlorothiazide (HYZAAR) 100-25  MG per tablet Take 1 tablet by mouth daily.     Marland Kitchen NOVOLIN R 100 UNIT/ML injection Inject 15-20 Units into the skin daily as needed for high blood sugar. Sliding scale: 300-400=20 units, 200-300=15 units (pt states her glucose level is never any lower)  0  . pantoprazole (PROTONIX) 40 MG tablet Take 40 mg by mouth at bedtime.     . promethazine (PHENERGAN) 25 MG tablet Take 1 tablet (25 mg total) by mouth every 6 (six) hours as needed for nausea or vomiting. 30 tablet 1  . albuterol (PROVENTIL HFA;VENTOLIN HFA) 108 (90 BASE) MCG/ACT inhaler Inhale 1 puff into the lungs as needed for wheezing or shortness of breath.    . interferon beta-1a (AVONEX) 30 MCG injection Inject 30 mcg into the muscle every 7 (seven) days. (Patient not taking: Reported on 08/03/2015) 12 each 3   No facility-administered medications prior to visit.    PAST MEDICAL HISTORY: Past Medical History  Diagnosis Date  . Multiple sclerosis   . Headache   . Hypertension   . Neuropathy   . Vision abnormalities   . PONV (postoperative nausea and vomiting)     sometimes...........  Marland Kitchen Diabetes mellitus without complication     dx at age 50  type 68  . Depression   . GERD (gastroesophageal reflux disease)   . Neuromuscular disorder     dx 2003 with MS  . Anemia      PAST SURGICAL HISTORY: Past Surgical History  Procedure Laterality Date  . Tonsillectomy    . Cholecystectomy    . Dental surgery    . Tubal ligation    . Right foot surgery      remove a piece of glass  (had c/o for about 1 yr)  . Lumbar laminectomy with coflex 1 level Bilateral 06/29/2015    Procedure: LUMBAR LAMINECTOMY WITH COFLEX 1 LEVEL;  Surgeon: Karie Chimera, MD;  Location: [facility];  Service: Neurosurgery;  Laterality: Bilateral;  LUMBAR LAMINECTOMY WITH COFLEX 1 LEVEL  . Wound exploration N/A 07/22/2015    Procedure: WOUND EXPLORATION;  Surgeon: Karie Chimera, MD;  Location: [place];  Service: Neurosurgery;  Laterality: N/A;    FAMILY HISTORY: Family History  Problem Relation Age of Onset  . Hypertension Mother   . Diabetes Mother   . Stroke Mother   . Heart attack Mother   . Peripheral vascular disease Mother     SOCIAL HISTORY:  History   Social History  . Marital Status: Single    Spouse Name: N/A  . Number of Children: N/A  . Years of Education: N/A   Occupational History  . Not on file.   Social History Main Topics  . Smoking status: Current Every Day Smoker -- 1.00 packs/day for 24 years    Types: Cigarettes  . Smokeless tobacco: Not on file  . Alcohol Use: 0.0 oz/week    0 Standard drinks or equivalent per week     Comment: occasional  . Drug Use: No  . Sexual Activity: Not on file   Other Topics Concern  . Not on file   Social History Narrative     PHYSICAL EXAM  Filed Vitals:   08/03/15 1054  BP: 102/66  Pulse: 78  Resp: 18  Height: 6\' 1"  (1.854 m)  Weight: 235 lb (106.595 kg)    Body mass index is 31.01 kg/(m^2).   General: The patient is well-developed and well-nourished and in no acute distress  Ears:  TMI, hearing symmetric.   She is tender to palpation behind and under ear.   No erythema or warmth.  Neck/back:  The neck is mildly tender.  Lower back is moderately tender more to the right   Neurologic  Exam  Mental status: The patient is alert and oriented x 3 at the time of the examination. The patient has apparent normal recent and remote memory, with an apparently normal attention span and concentration ability.   Speech is normal.  Cranial nerves: Extraocular movements are full. Facial symmetry is present. There is good facial sensation to soft touch bilaterally.Facial strength is normal.  Trapezius and sternocleidomastoid strength is normal. No dysarthria is noted.    Motor:  Muscle bulk and tone are normal. Strength is  5 / 5 in the arms but she has 4/5 left EHL and 4+/5 right EHL strength  Sensory: Sensory testing shpws decreased touch on the left leg (L5 worse than S1 dermatome) and decreased vibration in feet/ankles biulaterally.      Coordination: Cerebellar testing reveals good finger-nose-finger but poor heel-to-shin bilaterally.  Gait and station: Station is wide.   Positive Romberg.   Her gait is wide stance. Tandem is poor. She is doing better than at the last visit.  Reflexes: Deep tendon reflexes are symmetric and 1 bilaterally in legs.     DIAGNOSTIC DATA (LABS, IMAGING, TESTING) - I reviewed patient records, labs, notes, testing and imaging myself where available.     ASSESSMENT AND PLAN   Multiple sclerosis  Chronic fatigue  Lumbar radicular pain  Major depressive disorder, recurrent episode, moderate  Diabetic polyneuropathy associated with type 2 diabetes mellitus  Dysesthesia  Urinary urgency   1.  For now, she plans to stay on Avonex. However, she did want to know more about the oral agents and we discussed Tecfidera and Aubagio in more detail. 2.  She will continue on her symptomatic medications 3.  If sleepiness worsens when she is further out of the postoperative stage, consider a stimulant. 4.   rtc 4  months or sooner if new or worsening neurologic symptoms..  45 minute face-to-face evaluation with greater than one half of the time  counseling and coordinating care about her multiple sclerosis symptoms management.  Richard A. Felecia Shelling, MD, PhD 03/01/8587, 50:27 AM Certified in Neurology, Clinical Neurophysiology, Sleep Medicine, Pain Medicine and Neuroimaging  [facility] [address], [place], [place] [address] [phone]

## 2015-08-13 ENCOUNTER — Telehealth: Payer: Self-pay | Admitting: Neurology

## 2015-08-13 NOTE — Telephone Encounter (Signed)
Patient called requesting refill for HYDROcodone-acetaminophen (NORCO) 10-325 MG per tablet . Pt advised RX will be ready within 24hrs unless otherwise informed by RN

## 2015-08-13 NOTE — Telephone Encounter (Signed)
Pt called stating since back surgey when she stand up knees feel as if it is not going to hold lt>rt. She state CTS is getting worse. Please call and advise. Patient can be reached at [phone]. Pt advised Dr Jennye Moccasin will be in the office on Monday.

## 2015-08-13 NOTE — Telephone Encounter (Signed)
Patient returned your call.

## 2015-08-13 NOTE — Telephone Encounter (Signed)
LMTC./fim 

## 2015-08-13 NOTE — Telephone Encounter (Signed)
It appears this Rx was last written on 07/30.  I called back and spoke with the patient.  Says she is not out of medication yet, but has been having more pain in hand and legs (from falling).  At times she has had to take two tabs instead of one.  She is aware Dr Felecia Shelling is out of the office until Monday, and does not wish to have a message sent to [facility].  Prefers to wait for his return so he can review medication request.

## 2015-08-13 NOTE — Telephone Encounter (Signed)
Angela Solomon.  She was just in on 08-03-15, and at that time said gait/balance had improved since recent back surgery.  [name] also documented that she is improved since last ov.  She did not complain about her knees at office visit.  She also did not mention cts.  We can offer a f/u appt. to discuss these sx. if she likes.  She may also call [facility] if she feels she is having difficulty related to surgery/fim

## 2015-08-16 ENCOUNTER — Other Ambulatory Visit: Payer: Self-pay

## 2015-08-16 ENCOUNTER — Telehealth: Payer: Self-pay

## 2015-08-16 MED ORDER — HYDROCODONE-ACETAMINOPHEN 10-325 MG PO TABS: 1.0000 | ORAL_TABLET | Freq: Four times a day (QID) | ORAL | Status: DC | PRN

## 2015-08-16 NOTE — Telephone Encounter (Signed)
Request entered per instruction.  Forwarded to provider for signature.  I called the patient back to relay message from MD, Rx needs to last 30 days or more.  Got no answer.  Left message.

## 2015-08-16 NOTE — Telephone Encounter (Signed)
I have spoken with Angela Solomon this afternoon.  She sts. that she is still falling due to problems with walker.  Sts. she is having more pain/numbness in her hands.  These issues are contradictory to what she reported at last ov--so appt. to discuss further was given for 08-17-15/fim

## 2015-08-16 NOTE — Telephone Encounter (Signed)
Patient is requesting a refill on Hydrocodone.  According to chart, Rx was last written on 07/30 for #90 by Dr Sherley Bounds.  I contacted [facility].  They verified patient filled Rx from Dr Ronnald Ramp there on 07/31.  As well, we prescribed #90 on 07/19.  Patient said she has fallen a couple times, causing more pain in hand and legs, and at times she has taken an extra tab to alleviate the pain.  Would you like to refill Rx at this time?  Please advise.  Thank you.

## 2015-08-16 NOTE — Telephone Encounter (Signed)
She did just have lumbar surgery.  Lets go ahead and refill #90 this time but lest her know it really needs to last 30 days or more  Thank you Delfino Lovett

## 2015-08-16 NOTE — Telephone Encounter (Signed)
Patient is returning your call.    Thanks

## 2015-08-16 NOTE — Telephone Encounter (Signed)
Message has been sent to provider for review.

## 2015-08-17 ENCOUNTER — Encounter: Payer: Self-pay | Admitting: *Deleted

## 2015-08-17 ENCOUNTER — Encounter: Payer: Self-pay | Admitting: Neurology

## 2015-08-17 ENCOUNTER — Ambulatory Visit (INDEPENDENT_AMBULATORY_CARE_PROVIDER_SITE_OTHER): Payer: Medicaid Other | Admitting: Neurology

## 2015-08-17 VITALS — BP 110/76 | HR 92 | Resp 18 | Ht 73.0 in | Wt 225.6 lb

## 2015-08-17 DIAGNOSIS — M25562 Pain in left knee: Secondary | ICD-10-CM

## 2015-08-17 DIAGNOSIS — M5416 Radiculopathy, lumbar region: Secondary | ICD-10-CM

## 2015-08-17 DIAGNOSIS — M25512 Pain in left shoulder: Secondary | ICD-10-CM

## 2015-08-17 DIAGNOSIS — E1142 Type 2 diabetes mellitus with diabetic polyneuropathy: Secondary | ICD-10-CM | POA: Diagnosis not present

## 2015-08-17 DIAGNOSIS — R208 Other disturbances of skin sensation: Secondary | ICD-10-CM | POA: Diagnosis not present

## 2015-08-17 DIAGNOSIS — G5622 Lesion of ulnar nerve, left upper limb: Secondary | ICD-10-CM | POA: Diagnosis not present

## 2015-08-17 MED ORDER — AMITRIPTYLINE HCL 75 MG PO TABS: 75.0000 mg | ORAL_TABLET | Freq: Every day | ORAL | Status: DC

## 2015-08-17 NOTE — Progress Notes (Signed)
Hydrocodone rx. up front [facility] 

## 2015-08-17 NOTE — Progress Notes (Signed)
[facility]  PATIENT: Angela Solomon DOB: 04-12-1976  REFERRING CLINICIAN: Dr. Rita Ohara  HISTORY FROM: patient REASON FOR VISIT: MS   HISTORICAL  CHIEF COMPLAINT:  Chief Complaint  Patient presents with  . Multiple Sclerosis    Sts. she is  having more pain in her left shoulder radiating down left arm to 4th and 5th fingers, onset yrs. ago.  Sts. she has increased bilat knee pain, she thinks from mult. falls.  She is ambulatory with steady gait, no assistance today.  She would like to discuss pain meds, possible referral to ortho for carpal tunnel release./fim    HISTORY OF PRESENT ILLNESS:  Angela Solomon is a 39 year old woman with relapsing remitting multiple sclerosis here today because of more pain..    Pain:  She notes more left shoulder and lefthand pain.   Shoulder pain is worse with use.    Other pain radiates from elbow to the 4th and 5th fingers and they feel numb at time.    She feels left hand/arm is weaker.   Right is a little weak but left is worse.     She is noting more knee pain (left > right) since she fell several times.      Also having pain in left cheek when she chews or yawns.   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  She used to have clicking there.    MS:   She is currently taking Avonex and is currently out and needs a refill.    LBP/leg pain:   She feels better since her surgery last month but still notes pain.  Since last visit when she fell, she feels there is more pain in the leg.     Gait/strength:   She feels gait is better since surgery.    She does not need a cane.   Strength is ok but she fatigues easily.      Sensation:  She has some numbness and pain in both feet.   She has diabetes and has a superimposed diabetic polyneuropathy.   Amitriptyline and gabapentin helps her dysesthesia.     Bladder:   She feels she is doing well.   She has some frequency and urgency but no incontinence.  ---------------------------------------  MS  History:   She presented in 2003 with gait ataxia, slurred speech and right sided paresthesias. Imaging studies were performed and she was diagnosed with multiple sclerosis. Initially, she was placed on Rebif but had skin reactions and switched to Copaxone. On Copaxone, she had an immediate hypersensitivity response and it was discontinued. She has been on Avonex most of the time but has been off for months at a time when she has had insurance difficulties.      Review of MRI's: 1. MRI of the cervical spine dated 05/20/2014. It showed enhancing spinal cord lesions consistent with MS. C2-C3 there was a left lateral focus. Also at C2-C3 there was a posterior focus. At C3-C4 there was a right posterior lateral focus. At C4 there was a dorsal focus and at C5 there was another dorsal focus. 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 There are multilevel degenerative changes most pronounced at C7-T1 where there could be dynamic impingement of the right C8 nerve root.  2. MRI of the brain dated 09/28/2013 showing diffuse white matter foci in the periventricular and deep white matter consistent with [place].  Although there were no enhancing foci, when compared to a previous MRI  dated 10/11/2010, there was some progression.  3. An MRI of the lumbar spine dated 04/27/2015 showed a large disc extrusion with sequestered fragment causing bilateral L5 nerve root compression.   4. MRI of the brain with and without contrast dated 10/16/2008 showed enhancing lesions.  She was reportedly off medication at that time.   REVIEW OF SYSTEMS:  Constitutional: No fevers, chills, sweats, or change in appetite.   Fatigue, poor sleep Eyes: No visual changes, double vision, eye pain Ear, nose and throat: No hearing loss, ear pain, nasal congestion, sore throat Cardiovascular: No chest pain, palpitations Respiratory:  No shortness of breath at rest or with exertion.   No wheezes GastrointestinaI: No nausea, vomiting, diarrhea,  abdominal pain, fecal incontinence Genitourinary:  No dysuria, urinary retention or frequency.  No nocturia. Musculoskeletal:  as above. Integumentary: No rash, pruritus, skin lesions Neurological: as above Psychiatric: No depression at this time.  No anxiety Endocrine: No palpitations, diaphoresis, change in appetite, change in weigh or increased thirst Hematologic/Lymphatic:  No anemia, purpura, petechiae. Allergic/Immunologic: No itchy/runny eyes, nasal congestion, recent allergic reactions, rashes  ALLERGIES: Allergies  Allergen Reactions  . Glatiramer Anaphylaxis    Generic Copaxone  . Lisinopril Anaphylaxis  . Morphine And Related Hives  . Aspirin Nausea And Vomiting    Fevers and GI upset  . Hydrocodone-Acetaminophen Nausea And Vomiting    Pt states can take Hydrocodone but has a reaction to a additive in vicodin  . Zithromax [Azithromycin] Rash    HOME MEDICATIONS: Outpatient Prescriptions Prior to Visit  Medication Sig Dispense Refill  . acetaminophen (TYLENOL) 500 MG tablet Take 1,000 mg by mouth every 6 (six) hours as needed for mild pain or moderate pain.    Marland Kitchen albuterol (PROVENTIL HFA;VENTOLIN HFA) 108 (90 BASE) MCG/ACT inhaler Inhale 1 puff into the lungs as needed for wheezing or shortness of breath.    Marland Kitchen amitriptyline (ELAVIL) 25 MG tablet Take 50 mg by mouth at bedtime.   5  . B-D INS SYR ULTRAFINE 1CC/31G 31G X 5/16" 1 ML MISC See admin instructions.  0  . carvedilol (COREG) 25 MG tablet Take 25 mg by mouth 2 (two) times daily.     . citalopram (CELEXA) 20 MG tablet Take 20 mg by mouth daily.     . cyclobenzaprine (FLEXERIL) 10 MG tablet Take 1 tablet (10 mg total) by mouth 3 (three) times daily as needed for muscle spasms. 30 tablet 1  . diazepam (VALIUM) 10 MG tablet Take 1 tablet (10 mg total) by mouth at bedtime as needed for anxiety. 30 tablet 1  . ferrous sulfate 325 (65 FE) MG tablet Take 325 mg by mouth daily.     . fluticasone (FLONASE) 50 MCG/ACT nasal  spray Place 2 sprays into both nostrils as needed for allergies.     Marland Kitchen gabapentin (NEURONTIN) 400 MG capsule Take 800 mg by mouth 4 (four) times daily.     Marland Kitchen HYDROcodone-acetaminophen (NORCO) 10-325 MG per tablet Take 1 tablet by mouth every 6 (six) hours as needed for moderate pain. 90 tablet 0  . insulin detemir (LEVEMIR) 100 UNIT/ML injection Inject 55-70 Units into the skin 2 (two) times daily. 55 units in the morning and 70 units at night    . interferon beta-1a (AVONEX) 30 MCG injection Inject 30 mcg into the muscle every 7 (seven) days. 12 each 3  . losartan-hydrochlorothiazide (HYZAAR) 100-25 MG per tablet Take 1 tablet by mouth daily.     Marland Kitchen NOVOLIN R 100  UNIT/ML injection Inject 15-20 Units into the skin daily as needed for high blood sugar. Sliding scale: 300-400=20 units, 200-300=15 units (pt states her glucose level is never any lower)  0  . pantoprazole (PROTONIX) 40 MG tablet Take 40 mg by mouth at bedtime.     . promethazine (PHENERGAN) 25 MG tablet Take 1 tablet (25 mg total) by mouth every 6 (six) hours as needed for nausea or vomiting. 30 tablet 1   No facility-administered medications prior to visit.    PAST MEDICAL HISTORY: Past Medical History  Diagnosis Date  . Multiple sclerosis   . Headache   . Hypertension   . Neuropathy   . Vision abnormalities   . PONV (postoperative nausea and vomiting)     sometimes...........  Marland Kitchen Diabetes mellitus without complication     dx at age 44  type 26  . Depression   . GERD (gastroesophageal reflux disease)   . Neuromuscular disorder     dx 2003 with MS  . Anemia     PAST SURGICAL HISTORY: Past Surgical History  Procedure Laterality Date  . Tonsillectomy    . Cholecystectomy    . Dental surgery    . Tubal ligation    . Right foot surgery      remove a piece of glass  (had c/o for about 1 yr)  . Lumbar laminectomy with coflex 1 level Bilateral 06/29/2015    Procedure: LUMBAR LAMINECTOMY WITH COFLEX 1 LEVEL;  Surgeon: Karie Chimera, MD;  Location: [place];  Service: Neurosurgery;  Laterality: Bilateral;  LUMBAR LAMINECTOMY WITH COFLEX 1 LEVEL  . Wound exploration N/A 07/22/2015    Procedure: WOUND EXPLORATION;  Surgeon: Karie Chimera, MD;  Location: [place] [facility];  Service: Neurosurgery;  Laterality: N/A;    FAMILY HISTORY: Family History  Problem Relation Age of Onset  . Hypertension Mother   . Diabetes Mother   . Stroke Mother   . Heart attack Mother   . Peripheral vascular disease Mother     SOCIAL HISTORY:  Social History   Social History  . Marital Status: Single    Spouse Name: N/A  . Number of Children: N/A  . Years of Education: N/A   Occupational History  . Not on file.   Social History Main Topics  . Smoking status: Current Every Day Smoker -- 1.00 packs/day for 24 years    Types: Cigarettes  . Smokeless tobacco: Not on file  . Alcohol Use: 0.0 oz/week    0 Standard drinks or equivalent per week     Comment: occasional  . Drug Use: No  . Sexual Activity: Not on file   Other Topics Concern  . Not on file   Social History Narrative     PHYSICAL EXAM  Filed Vitals:   08/17/15 1144  BP: 110/76  Pulse: 92  Resp: 18  Height: 6\' 1"  (1.854 m)  Weight: 225 lb 9.6 oz (102.331 kg)    Body mass index is 29.77 kg/(m^2).   General: The patient is well-developed and well-nourished and in no acute distress  Neck/back:  The neck is mildly tender, worse on left.  Lower back is mildly  tender more to the right   Neurologic Exam  Mental status: The patient is alert and oriented x 3 at the time of the examination. The patient has apparent normal recent and remote memory, with an apparently normal attention span and concentration ability.   Speech is normal.  Cranial nerves: Extraocular movements  are full. Facial symmetry is present. There is good facial sensation to soft touch bilaterally.Facial strength is normal.  Trapezius and sternocleidomastoid strength is normal. No  dysarthria is noted.    Motor:  Muscle bulk and tone are normal. Strength is  5 / 5 in the right arm, 4+/5 in left ulnar innervated muscles and she has 4/5 left EHL and 4+/5 right EHL strength  Sensory: Sensory testing shows decreased touch/temp in left ulnar distribution for hand and the the left L5   Dermatome and decreased vibration in feet/ankles biulaterally.      Coordination: Cerebellar testing reveals good finger-nose-finger but poor heel-to-shin bilaterally.  Gait and station: Station is wide.   Positive Romberg.   Her gait is wide stance. Tandem is poor. She is doing better than at the last visit.  Reflexes: Deep tendon reflexes are symmetric and 1 bilaterally in legs.     DIAGNOSTIC DATA (LABS, IMAGING, TESTING) - I reviewed patient records, labs, notes, testing and imaging myself where available.     ASSESSMENT AND PLAN   Diabetic polyneuropathy associated with type 2 diabetes mellitus  Lumbar radicular pain  Dysesthesia  Knee pain, left - Plan: AMB referral to orthopedics  Shoulder pain, left  Ulnar neuropathy of left upper extremity - Plan: NCV with EMG(electromyography)   1.     1.  Continue Avonex for MS.  2.  Continue hydrocodone for pain. Increase amitriptyline from 50 to 75 mg nightly. 3.  Try to be more active and exercises as tolerated. 4.   Refer to orthopedics for knee pain  5.   Nerve conduction EMG for probable ulnar neuropathy of the left arm  rtc 4  months or sooner if new or worsening neurologic symptoms..  45 minute face-to-face evaluation with greater than one half of the time counseling and coordinating care about her multiple sclerosis symptoms management.  Richard A. Felecia Shelling, MD, PhD 1/94/1740, 81:44 PM Certified in Neurology, Clinical Neurophysiology, Sleep Medicine, Pain Medicine and Neuroimaging  [facility] [address] [place], [place] [address] [phone]

## 2015-09-13 ENCOUNTER — Telehealth: Payer: Self-pay | Admitting: Neurology

## 2015-09-13 MED ORDER — HYDROCODONE-ACETAMINOPHEN 10-325 MG PO TABS: 1.0000 | ORAL_TABLET | Freq: Four times a day (QID) | ORAL | Status: DC | PRN

## 2015-09-13 MED ORDER — DIAZEPAM 10 MG PO TABS: 10.0000 mg | ORAL_TABLET | Freq: Every evening | ORAL | Status: DC | PRN

## 2015-09-13 NOTE — Telephone Encounter (Signed)
Pt needs refill on diazepam (VALIUM) 10 MG tablet, HYDROcodone-acetaminophen (NORCO) 10-325 MG per tablet, may call pt on [phone]

## 2015-09-13 NOTE — Telephone Encounter (Signed)
Rx's signed, up front [facility]/fim

## 2015-09-13 NOTE — Telephone Encounter (Signed)
Rx's printed, waiting on [name] sig./fim

## 2015-09-16 ENCOUNTER — Ambulatory Visit (INDEPENDENT_AMBULATORY_CARE_PROVIDER_SITE_OTHER): Payer: Medicaid Other | Admitting: Neurology

## 2015-09-16 ENCOUNTER — Ambulatory Visit (INDEPENDENT_AMBULATORY_CARE_PROVIDER_SITE_OTHER): Payer: Self-pay | Admitting: Neurology

## 2015-09-16 DIAGNOSIS — G5622 Lesion of ulnar nerve, left upper limb: Secondary | ICD-10-CM

## 2015-09-16 DIAGNOSIS — Z0289 Encounter for other administrative examinations: Secondary | ICD-10-CM

## 2015-09-16 MED ORDER — HYDROCODONE-ACETAMINOPHEN 10-325 MG PO TABS: 1.0000 | ORAL_TABLET | Freq: Three times a day (TID) | ORAL | Status: DC | PRN

## 2015-09-16 MED ORDER — DIAZEPAM 10 MG PO TABS: 10.0000 mg | ORAL_TABLET | Freq: Every evening | ORAL | Status: DC | PRN

## 2015-09-16 NOTE — Progress Notes (Signed)
   Angela Solomon DOB:  02-Feb-1976  DOS:  09/16/2015   HISTORY:  She is a 39 year old woman with some pain in the left hand. Exam shows reduced sensation in the palmar aspect of the fourth and fifth fingers of the left hand. There is mild weakness of the left ulnar innervated intrinsic hand muscles.  She has a Tinel sign at the left elbow  NERVE CONDUCTION STUDIES:  The left ulnar motor response had a normal distal latency but there was moderately severe slowing in the forearm and across the elbow consistent with ulnar neuropathy at the left cubital tunnel.  The left ulnar motor response's amplitude was reduced compared to the right.  The left ulnar sensory response was reduced in amplitude and had a mildly  long distal latency.  The right ulnar motor response, bilateral median motor responses, bilateral median sensory responses and right ulnar sensory response were normal.  EMG STUDIES:  Needle EMG of the left first dorsal interosseous and left abductor digiti minimus showed polyphasic motor units with reduced recruitment consistent with neuropathy.   There was no acute deprivation.  Needle EMG of the extensor digitorum communis, flexor carpi ulnaris, biceps, triceps and deltoid was normal.  IMPRESSION:  This is an abnormal NCV/EMG showing a moderately severe chronic left ulnar neuropathy with maximal slowing of the nerve at and just above the cubital tunnel on the left.       Stanley Lyness A. Felecia Shelling, MD, PhD Certified in Neurology, [place] Neurophysiology, Sleep Medicine, Pain Medicine and Neuroimaging  [facility] [address] [place], [place] [address] [phone]

## 2015-09-27 ENCOUNTER — Telehealth: Payer: Self-pay | Admitting: Neurology

## 2015-09-27 NOTE — Telephone Encounter (Signed)
I spoke to Angela Solomon after she called the answering service this evening.    She reports that the incision from her recent lumbar surgery opened up and is draining thick fluid.   No headache.     I advised her that she might have an infection at that site and needs to call her Neurosurgeon to be seen as soon as possible about this as she might need a procedure or medications.   If she can't be seen by their group quickly, she should go to the ER.    She voiced understanding.     [phone]

## 2015-09-30 ENCOUNTER — Telehealth: Payer: Self-pay | Admitting: Neurology

## 2015-09-30 NOTE — Telephone Encounter (Signed)
Pt called to let Dr Felecia Shelling know that she did go ED as he instructed.She was given antibiotic and area was cleaned and bandaged.

## 2015-10-05 NOTE — Telephone Encounter (Signed)
error 

## 2015-10-12 ENCOUNTER — Other Ambulatory Visit: Payer: Self-pay | Admitting: Neurology

## 2015-10-12 ENCOUNTER — Telehealth: Payer: Self-pay | Admitting: Neurology

## 2015-10-12 MED ORDER — HYDROCODONE-ACETAMINOPHEN 10-325 MG PO TABS: 1.0000 | ORAL_TABLET | Freq: Three times a day (TID) | ORAL | Status: DC | PRN

## 2015-10-12 NOTE — Telephone Encounter (Signed)
Pt called stating she called has talked with [facility] in [place] and there is a problem with referral. Please call office for referral. Please call and advise at [phone].

## 2015-10-12 NOTE — Telephone Encounter (Signed)
Pt called requesting refill for HYDROcodone-acetaminophen (NORCO) 10-325 MG per tablet . Pt states she will not pick up RX until Friday morning. Pt advised to call before coming and office closes at 12 on Fridays.

## 2015-10-13 ENCOUNTER — Encounter: Payer: Self-pay | Admitting: *Deleted

## 2015-10-13 NOTE — Progress Notes (Signed)
Hydrocodone rx. up front [facility] 

## 2015-10-13 NOTE — Telephone Encounter (Signed)
Called on referral and Patient was resent to [place] at [facility]

## 2015-10-13 NOTE — Telephone Encounter (Signed)
Patient has been scheduled [facility] [place] . [address] th arrive at 9:00 am Patient will see PA Rudene Re. Patient aware.

## 2015-10-21 NOTE — Telephone Encounter (Signed)
noted/fim 

## 2015-10-21 NOTE — Telephone Encounter (Signed)
Natasha with [facility] in [place] called to advise the patient did not show up for her appointment today.

## 2015-10-28 ENCOUNTER — Telehealth: Payer: Self-pay | Admitting: Neurology

## 2015-10-28 NOTE — Telephone Encounter (Signed)
Patient called to request prescription for steroids. Also patient would like a prescription for antibiotic for lower back lumbar incision drainage (clear drainage with odor), ER prescribed Bactrim and has finished that medication, patient "does not want to see Dr. Hal Neer no more".

## 2015-10-29 NOTE — Telephone Encounter (Signed)
I have spoken with Angela Solomon this afternoon and per [name], advised that she must f/u with NS regarding surgical site, drainage.  [name] is not able to rx. antibiotics for surgical drainage.  I have emphasized the importance of f/u with NS to make sure there are no problems/infection.  She has verbalized understanding of same/fim

## 2015-11-09 ENCOUNTER — Telehealth: Payer: Self-pay | Admitting: Neurology

## 2015-11-09 MED ORDER — HYDROCODONE-ACETAMINOPHEN 10-325 MG PO TABS: 1.0000 | ORAL_TABLET | Freq: Three times a day (TID) | ORAL | Status: DC | PRN

## 2015-11-09 NOTE — Telephone Encounter (Signed)
Patient is calling to get a written Rx hydrocodone-acetaminephen 10-325 mg.  She is aware that she cannot have it refilled as yet but is going out of town and will have it refilled there.  [employer]

## 2015-11-09 NOTE — Telephone Encounter (Signed)
Request entered, forwarded to provider for review.  

## 2015-11-11 NOTE — Telephone Encounter (Signed)
Pt called about her refill request. She wanted to know if it was ready for her to pick up. Please call and advise

## 2015-11-11 NOTE — Telephone Encounter (Signed)
Message sent to clinic to follow up.  

## 2015-11-12 NOTE — Telephone Encounter (Signed)
Patient called and stated that she was concerned about her referral to [facility] because [facility] never called to schedule her. I told her I would check on the referral and give her a call back. After looking into I found previous phone note stating that the patient actually had apt at [facility]. Called back to inform patient but she did not answer. Left her a VM asking her to return my call.

## 2015-11-12 NOTE — Telephone Encounter (Signed)
Spoke with patient. Informed her she had apt at [facility] on [address] and would need to call them and reschedule. She was very appreciative and stated she would call them.

## 2015-12-07 ENCOUNTER — Encounter: Payer: Self-pay | Admitting: Neurology

## 2015-12-07 ENCOUNTER — Ambulatory Visit (INDEPENDENT_AMBULATORY_CARE_PROVIDER_SITE_OTHER): Payer: Medicaid Other | Admitting: Neurology

## 2015-12-07 VITALS — BP 158/96 | HR 88 | Resp 16 | Ht 73.0 in | Wt 228.6 lb

## 2015-12-07 DIAGNOSIS — M25512 Pain in left shoulder: Secondary | ICD-10-CM | POA: Diagnosis not present

## 2015-12-07 DIAGNOSIS — R3915 Urgency of urination: Secondary | ICD-10-CM

## 2015-12-07 DIAGNOSIS — F331 Major depressive disorder, recurrent, moderate: Secondary | ICD-10-CM

## 2015-12-07 DIAGNOSIS — R208 Other disturbances of skin sensation: Secondary | ICD-10-CM | POA: Diagnosis not present

## 2015-12-07 DIAGNOSIS — R5382 Chronic fatigue, unspecified: Secondary | ICD-10-CM

## 2015-12-07 DIAGNOSIS — G35 Multiple sclerosis: Secondary | ICD-10-CM

## 2015-12-07 DIAGNOSIS — E1142 Type 2 diabetes mellitus with diabetic polyneuropathy: Secondary | ICD-10-CM

## 2015-12-07 DIAGNOSIS — M5416 Radiculopathy, lumbar region: Secondary | ICD-10-CM

## 2015-12-07 DIAGNOSIS — E119 Type 2 diabetes mellitus without complications: Secondary | ICD-10-CM | POA: Insufficient documentation

## 2015-12-07 MED ORDER — INTERFERON BETA-1A 30 MCG IM KIT: 30.0000 ug | PACK | INTRAMUSCULAR | Status: DC

## 2015-12-07 MED ORDER — GABAPENTIN 400 MG PO CAPS: 800.0000 mg | ORAL_CAPSULE | Freq: Four times a day (QID) | ORAL | Status: DC

## 2015-12-07 MED ORDER — HYDROCODONE-ACETAMINOPHEN 10-325 MG PO TABS: 1.0000 | ORAL_TABLET | Freq: Four times a day (QID) | ORAL | Status: DC | PRN

## 2015-12-07 NOTE — Progress Notes (Signed)
[facility]  PATIENT: Angela Solomon DOB: [birth year]  REFERRING CLINICIAN: Dr. Rita Ohara  HISTORY FROM: patient REASON FOR VISIT: MS   HISTORICAL  CHIEF COMPLAINT:  Chief Complaint  Patient presents with  . Multiple Sclerosis    Sts. she has been out of Avonex for about 4 mos.  Sts. she continues to have pain left shoulder down left arm--has an appt. with [facility] on 12-16-15.  Today, she is here with request that Dr. Felecia Shelling explain the problem she has with her ulnar nerve to her husband.  She also sts. she is having more trouble getting her words out for at least 6 mos. Sts. she knows what word she wants to say but can't get it out of her mouth.  Also sts. both knees give way, right worse than left, and she frequently   . Left Arm Pain    falls/fim    HISTORY OF PRESENT ILLNESS:  Angela Solomon is a 39 year old woman with relapsing remitting multiple sclerosis here today because of more pain..  A couple months ago, she was also diagnosed with severe left ulnar neuropathy.  Pain:  She notes more left shoulder and left hand pain.   Shoulder pain is worse with use.    Other pain radiates from elbow to the 4th and 5th fingers and they feel numb at time.    She feels left hand/arm is weaker.   Right is a little weak but left is worse.     She is noting more knee pain (left > right) since she fell several times.      Also having pain in left cheek when she chews or yawns.   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  She used to have clicking there.    MS:   She is currently taking Avonex and is currently out and needs a refill.    LBP/leg pain:   She had lumbar surgery complicatd by a wound infection requiring repeat surgery ([facility]).  She had oozing from the wound and still has an opening and she went to the ER.   She takes hydrocodone 3 times a day.      Gait/strength:   She feels gait improved after back surgery but she has poor balance and she falls towards the right a  lot.     She does not need a cane.   Strength is ok but she fatigues easily.    She feels the right foot is weaker than earlier this year.    Sensation:  She has some numbness and pain in both feet, worse on the right.   She has diabetes and has a superimposed diabetic polyneuropathy.   Amitriptyline and gabapentin helps her dysesthesia.     Bladder:   She feels she is doing well.   She has some frequency and urgency but no incontinence.  ---------------------------------------  MS History:   She presented in 2003 with gait ataxia, slurred speech and right sided paresthesias. Imaging studies were performed and she was diagnosed with multiple sclerosis. Initially, she was placed on Rebif but had skin reactions and switched to Copaxone. On Copaxone, she had an immediate hypersensitivity response and it was discontinued. She has been on Avonex most of the time but has been off for months at a time when she has had insurance difficulties.      Review of MRI's: 1. MRI of the cervical spine dated 05/20/2014. It showed enhancing spinal cord lesions consistent with MS.  C2-C3 there was a left lateral focus. Also at C2-C3 there was a posterior focus. At C3-C4 there was a right posterior lateral focus. At C4 there was a dorsal focus and at C5 there was another dorsal focus. 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 There are multilevel degenerative changes most pronounced at C7-T1 where there could be dynamic impingement of the right C8 nerve root.  2. MRI of the brain dated 09/28/2013 showing diffuse white matter foci in the periventricular and deep white matter consistent with [place].  Although there were no enhancing foci, when compared to a previous MRI dated 10/11/2010, there was some progression.  3. An MRI of the lumbar spine dated 04/27/2015 showed a large disc extrusion with sequestered fragment causing bilateral L5 nerve root compression.   4. MRI of the brain with and without contrast dated 10/16/2008  showed enhancing lesions.  She was reportedly off medication at that time.   REVIEW OF SYSTEMS:  Constitutional: No fevers, chills, sweats, or change in appetite.   Fatigue, poor sleep Eyes: No visual changes, double vision, eye pain Ear, nose and throat: No hearing loss, ear pain, nasal congestion, sore throat Cardiovascular: No chest pain, palpitations Respiratory:  No shortness of breath at rest or with exertion.   No wheezes GastrointestinaI: No nausea, vomiting, diarrhea, abdominal pain, fecal incontinence Genitourinary:  No dysuria, urinary retention or frequency.  No nocturia. Musculoskeletal:  as above. Integumentary: No rash, pruritus, skin lesions Neurological: as above Psychiatric: No depression at this time.  No anxiety Endocrine: No palpitations, diaphoresis, change in appetite, change in weigh or increased thirst Hematologic/Lymphatic:  No anemia, purpura, petechiae. Allergic/Immunologic: No itchy/runny eyes, nasal congestion, recent allergic reactions, rashes  ALLERGIES: Allergies  Allergen Reactions  . Glatiramer Anaphylaxis    Generic Copaxone  . Lisinopril Anaphylaxis  . Morphine And Related Hives  . Aspirin Nausea And Vomiting    Fevers and GI upset  . Hydrocodone-Acetaminophen Nausea And Vomiting    Pt states can take Hydrocodone but has a reaction to a additive in vicodin  . Zithromax [Azithromycin] Rash    HOME MEDICATIONS: Outpatient Prescriptions Prior to Visit  Medication Sig Dispense Refill  . acetaminophen (TYLENOL) 500 MG tablet Take 1,000 mg by mouth every 6 (six) hours as needed for mild pain or moderate pain.    Marland Kitchen albuterol (PROVENTIL HFA;VENTOLIN HFA) 108 (90 BASE) MCG/ACT inhaler Inhale 1 puff into the lungs as needed for wheezing or shortness of breath.    Marland Kitchen amitriptyline (ELAVIL) 75 MG tablet Take 1 tablet (75 mg total) by mouth at bedtime. 30 tablet 11  . B-D INS SYR ULTRAFINE 1CC/31G 31G X 5/16" 1 ML MISC See admin instructions.  0  .  carvedilol (COREG) 25 MG tablet Take 25 mg by mouth 2 (two) times daily.     . cyclobenzaprine (FLEXERIL) 10 MG tablet Take 1 tablet (10 mg total) by mouth 3 (three) times daily as needed for muscle spasms. 30 tablet 1  . diazepam (VALIUM) 10 MG tablet Take 1 tablet (10 mg total) by mouth at bedtime as needed for anxiety. 30 tablet 5  . ferrous sulfate 325 (65 FE) MG tablet Take 325 mg by mouth daily.     . fluticasone (FLONASE) 50 MCG/ACT nasal spray Place 2 sprays into both nostrils as needed for allergies.     Marland Kitchen gabapentin (NEURONTIN) 400 MG capsule Take 800 mg by mouth 4 (four) times daily.     Marland Kitchen HYDROcodone-acetaminophen (NORCO) 10-325  MG tablet Take 1 tablet by mouth every 8 (eight) hours as needed for moderate pain. 90 tablet 0  . losartan-hydrochlorothiazide (HYZAAR) 100-25 MG per tablet Take 1 tablet by mouth daily.     . pantoprazole (PROTONIX) 40 MG tablet Take 40 mg by mouth at bedtime.     . promethazine (PHENERGAN) 25 MG tablet Take 1 tablet (25 mg total) by mouth every 6 (six) hours as needed for nausea or vomiting. 30 tablet 1  . insulin detemir (LEVEMIR) 100 UNIT/ML injection Inject 55-70 Units into the skin 2 (two) times daily. 55 units in the morning and 70 units at night    . citalopram (CELEXA) 20 MG tablet Take 20 mg by mouth daily.     . interferon beta-1a (AVONEX) 30 MCG injection Inject 30 mcg into the muscle every 7 (seven) days. (Patient not taking: Reported on 12/07/2015) 12 each 3  . NOVOLIN R 100 UNIT/ML injection Inject 15-20 Units into the skin daily as needed for high blood sugar. Sliding scale: 300-400=20 units, 200-300=15 units (pt states her glucose level is never any lower)  0   No facility-administered medications prior to visit.    PAST MEDICAL HISTORY: Past Medical History  Diagnosis Date  . Multiple sclerosis ([place])   . Headache   . Hypertension   . Neuropathy ([place])   . Vision abnormalities   . PONV (postoperative nausea and vomiting)      sometimes...........  Marland Kitchen Diabetes mellitus without complication (HCC)     dx at age 24  type 10  . Depression   . GERD (gastroesophageal reflux disease)   . Neuromuscular disorder ([place])     dx 2003 with MS  . Anemia     PAST SURGICAL HISTORY: Past Surgical History  Procedure Laterality Date  . Tonsillectomy    . Cholecystectomy    . Dental surgery    . Tubal ligation    . Right foot surgery      remove a piece of glass  (had c/o for about 1 yr)  . Lumbar laminectomy with coflex 1 level Bilateral 06/29/2015    Procedure: LUMBAR LAMINECTOMY WITH COFLEX 1 LEVEL;  Surgeon: Karie Chimera, MD;  Location: [facility];  Service: Neurosurgery;  Laterality: Bilateral;  LUMBAR LAMINECTOMY WITH COFLEX 1 LEVEL  . Wound exploration N/A 07/22/2015    Procedure: WOUND EXPLORATION;  Surgeon: Karie Chimera, MD;  Location: [facility];  Service: Neurosurgery;  Laterality: N/A;    FAMILY HISTORY: Family History  Problem Relation Age of Onset  . Hypertension Mother   . Diabetes Mother   . Stroke Mother   . Heart attack Mother   . Peripheral vascular disease Mother     SOCIAL HISTORY:  Social History   Social History  . Marital Status: Single    Spouse Name: N/A  . Number of Children: N/A  . Years of Education: N/A   Occupational History  . Not on file.   Social History Main Topics  . Smoking status: Current Every Day Smoker -- 1.00 packs/day for 24 years    Types: Cigarettes  . Smokeless tobacco: Not on file  . Alcohol Use: 0.0 oz/week    0 Standard drinks or equivalent per week     Comment: occasional  . Drug Use: No  . Sexual Activity: Not on file   Other Topics Concern  . Not on file   Social History Narrative     PHYSICAL EXAM  Filed Vitals:   12/07/15  1345  BP: 158/96  Pulse: 88  Resp: 16  Height: 6\' 1"  (1.854 m)  Weight: 228 lb 9.6 oz (103.692 kg)    Body mass index is 30.17 kg/(m^2).   General: The patient is well-developed and well-nourished and in no  acute distress  Neck/back:  The neck is mildly tender,left > right.  Lower back has wound with minimal drainage.      Neurologic Exam  Mental status: The patient is alert and oriented x 3 at the time of the examination. The patient has apparent normal recent and remote memory, with an apparently normal attention span and concentration ability.   Speech is normal.  Cranial nerves: Extraocular movements are full. Facial symmetry is present. There is good facial sensation to soft touch bilaterally.Facial strength is normal.  Trapezius and sternocleidomastoid strength is normal. No dysarthria is noted.    Motor:  Muscle bulk and tone are normal. Strength is  5 / 5 in the right arm, 4+/5 in left ulnar innervated muscles.    Strength is 4/5  EHL strength  Sensory: Sensory testing shows decreased touch/temp in left ulnar distribution for hand and the the left L5   Dermatome and decreased vibration in feet/ankles biulaterally.      Coordination: Cerebellar testing reveals good finger-nose-finger but poor heel-to-shin bilaterally.  Gait and station: Station is wide.   Positive Romberg.   Her gait is wide stance. Tandem is poor. She is doing better than at the last visit.  Reflexes: Deep tendon reflexes are symmetric and 1 bilaterally in legs.     DIAGNOSTIC DATA (LABS, IMAGING, TESTING) - I reviewed patient records, labs, notes, testing and imaging myself where available.     ASSESSMENT AND PLAN   Multiple sclerosis ([place])  Diabetic polyneuropathy associated with type 2 diabetes mellitus (HCC)  Chronic fatigue  Dysesthesia  Lumbar radicular pain  Major depressive disorder, recurrent episode, moderate (HCC)  Shoulder pain, left  Urinary urgency   1.  Continue Avonex for MS.   I'll send in a new script 2.  Continue hydrocodone for pain.  3.  Advised to follow-up with neurosurgery or primary care about wound in back 4.   She has appointment for left ulnar neuropathy with [facility] in [place]. rtc 4  months or sooner if new or worsening neurologic symptoms.Nanine Means. Felecia Shelling, MD, PhD [id number], [id number] PM Certified in Neurology, Clinical Neurophysiology, Sleep Medicine, Pain Medicine and Neuroimaging  [facility] [address], [place], [place] [address] [phone]

## 2016-01-04 ENCOUNTER — Encounter: Payer: Self-pay | Admitting: *Deleted

## 2016-01-04 ENCOUNTER — Other Ambulatory Visit: Payer: Self-pay | Admitting: Neurology

## 2016-01-04 MED ORDER — HYDROCODONE-ACETAMINOPHEN 10-325 MG PO TABS: 1.0000 | ORAL_TABLET | Freq: Four times a day (QID) | ORAL | Status: DC | PRN

## 2016-01-04 NOTE — Progress Notes (Signed)
Hydrocodone rx. up front [facility] 

## 2016-01-04 NOTE — Telephone Encounter (Signed)
Request forwarded to provider for review.

## 2016-01-04 NOTE — Telephone Encounter (Signed)
Pt called requesting for refill HYDROcodone-acetaminophen (NORCO) 10-325 MG tablet. Pt said she will not pick up RX until Thursday or Friday. Pt advised RX will be ready within 24 hrs unless informed otherwise by RN

## 2016-01-24 ENCOUNTER — Encounter: Payer: Self-pay | Admitting: *Deleted

## 2016-01-31 ENCOUNTER — Other Ambulatory Visit: Payer: Self-pay

## 2016-01-31 ENCOUNTER — Telehealth: Payer: Self-pay | Admitting: *Deleted

## 2016-01-31 ENCOUNTER — Telehealth: Payer: Self-pay | Admitting: Neurology

## 2016-01-31 MED ORDER — HYDROCODONE-ACETAMINOPHEN 10-325 MG PO TABS: 1.0000 | ORAL_TABLET | Freq: Four times a day (QID) | ORAL | Status: DC | PRN

## 2016-01-31 NOTE — Telephone Encounter (Signed)
Pt needs refill on HYDROcodone-acetaminophen (NORCO) 10-325 MG tablet. Thank you

## 2016-01-31 NOTE — Telephone Encounter (Signed)
Hydrocodone rx. up front [facility]/fim 

## 2016-01-31 NOTE — Telephone Encounter (Signed)
Encounter was already closed when it was sent.  Separate encounter opened for refill request, which has been sent to provider for review/approval.

## 2016-02-10 ENCOUNTER — Telehealth: Payer: Self-pay | Admitting: Neurology

## 2016-02-10 NOTE — Telephone Encounter (Signed)
Pt called in this morning and says she feels like she is having a flair up. She made an appt for tomorrow but would like a call back to discuss. Please call [phone]

## 2016-02-10 NOTE — Telephone Encounter (Signed)
LMTC./fim 

## 2016-02-11 ENCOUNTER — Ambulatory Visit (INDEPENDENT_AMBULATORY_CARE_PROVIDER_SITE_OTHER): Payer: Medicaid Other | Admitting: Neurology

## 2016-02-11 ENCOUNTER — Encounter: Payer: Self-pay | Admitting: Neurology

## 2016-02-11 VITALS — BP 164/88 | HR 76 | Resp 16 | Ht 73.0 in | Wt 245.6 lb

## 2016-02-11 DIAGNOSIS — G5622 Lesion of ulnar nerve, left upper limb: Secondary | ICD-10-CM | POA: Diagnosis not present

## 2016-02-11 DIAGNOSIS — E1142 Type 2 diabetes mellitus with diabetic polyneuropathy: Secondary | ICD-10-CM

## 2016-02-11 DIAGNOSIS — E1043 Type 1 diabetes mellitus with diabetic autonomic (poly)neuropathy: Secondary | ICD-10-CM | POA: Insufficient documentation

## 2016-02-11 DIAGNOSIS — I10 Essential (primary) hypertension: Secondary | ICD-10-CM | POA: Insufficient documentation

## 2016-02-11 DIAGNOSIS — E782 Mixed hyperlipidemia: Secondary | ICD-10-CM | POA: Insufficient documentation

## 2016-02-11 DIAGNOSIS — M5416 Radiculopathy, lumbar region: Secondary | ICD-10-CM | POA: Diagnosis not present

## 2016-02-11 DIAGNOSIS — M542 Cervicalgia: Secondary | ICD-10-CM | POA: Diagnosis not present

## 2016-02-11 DIAGNOSIS — E119 Type 2 diabetes mellitus without complications: Secondary | ICD-10-CM | POA: Insufficient documentation

## 2016-02-11 DIAGNOSIS — E040 Nontoxic diffuse goiter: Secondary | ICD-10-CM | POA: Insufficient documentation

## 2016-02-11 DIAGNOSIS — R5382 Chronic fatigue, unspecified: Secondary | ICD-10-CM | POA: Diagnosis not present

## 2016-02-11 DIAGNOSIS — E049 Nontoxic goiter, unspecified: Secondary | ICD-10-CM | POA: Insufficient documentation

## 2016-02-11 DIAGNOSIS — IMO0002 Reserved for concepts with insufficient information to code with codable children: Secondary | ICD-10-CM | POA: Insufficient documentation

## 2016-02-11 DIAGNOSIS — R269 Unspecified abnormalities of gait and mobility: Secondary | ICD-10-CM | POA: Insufficient documentation

## 2016-02-11 DIAGNOSIS — R208 Other disturbances of skin sensation: Secondary | ICD-10-CM | POA: Diagnosis not present

## 2016-02-11 DIAGNOSIS — G35 Multiple sclerosis: Secondary | ICD-10-CM | POA: Diagnosis not present

## 2016-02-11 MED ORDER — DULOXETINE HCL 60 MG PO CPEP: 60.0000 mg | ORAL_CAPSULE | Freq: Every day | ORAL | Status: DC

## 2016-02-11 MED ORDER — HYDROCODONE-ACETAMINOPHEN 10-325 MG PO TABS: 1.0000 | ORAL_TABLET | Freq: Four times a day (QID) | ORAL | Status: DC | PRN

## 2016-02-11 NOTE — Telephone Encounter (Signed)
Pt. has appt. with [name] this morning/fim

## 2016-02-11 NOTE — Progress Notes (Signed)
[facility]  PATIENT: Angela Solomon DOB: Jan 16, 1976  REFERRING CLINICIAN: Dr. Rita Ohara  HISTORY FROM: patient REASON FOR VISIT: MS   HISTORICAL  CHIEF COMPLAINT:  Chief Complaint  Patient presents with  . Multiple Sclerosis    Sts. she continues to tolerate Avonex well.  Sts. she is falling more due to numbness in feet, right leg gives way.  Sts. when she woke up yesterday, the right side of her face was swollen, right leg was swollen, right eye was tearing, and she has electric shock sensation entire right side of her body/fim    HISTORY OF PRESENT ILLNESS:  Angela Solomon is a 40 year old woman with relapsing remitting multiple sclerosis here today because of more pain..  A couple months ago, she was also diagnosed with severe left ulnar neuropathy.  Neck and shoulder and arm Pain:  She has neck, shoulder and arm pain, left > right.  She feels left hand/arm is weaker.   Right is a little weak but left is worse.       Also having pain in left cheek when she chews or yawns.   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  She used to have clicking there.    Left ulnar neuropathy:       Pain also radiates from elbow to the left 4th and 5th fingers and they feel numb at time.   NCV/EMG showed moderately severe ulnar neuropathy at the left cubital tunnel.       MS:   She is currently taking Avonex and is currently out and needs a refill.  She has not had any definite exacerbations recently  LBP/leg pain:   Her back pain is better and the wound is healing better now.   She had lumbar surgery Juy [id number] complicatd by a wound infection requiring repeat surgery ([facility]).  She had oozing from the wound for a while afterwards.   Legs still work, though a little less than before surgery.  She takes hydrocodone 3-4 times a day.      Gait/strength:   Gait is better after surgery but she has a mild limp, she feels, due to some mild right leg weakness.     She does not need a cane.   Sthe  fatigues easily, especially in right leg..    She feels the right foot is weaker than earlier this year.    Sensation:  She has some numbness and pain in both feet, worse on the right.   She has diabetes and has a superimposed diabetic polyneuropathy.   Amitriptyline and gabapentin helps her dysesthesia.     Bladder:   She feels she is doing well.   She has some frequency and urgency but no incontinence.  Spells:   She has had episodes where she gets weaker in her limbs and has some trembling.. This seems to happen sometimes when she is laying down and then tries to get up. She is concerned that she might be having seizures but denies any loss of consciousness.  ---------------------------------------  MS History:   She presented in 2003 with gait ataxia, slurred speech and right sided paresthesias. Imaging studies were performed and she was diagnosed with multiple sclerosis. Initially, she was placed on Rebif but had skin reactions and switched to Copaxone. On Copaxone, she had an immediate hypersensitivity response and it was discontinued. She has been on Avonex most of the time but has been off for months at a time when she has had  insurance difficulties.      Review of MRI's: 1. MRI of the cervical spine dated 05/20/2014. It showed enhancing spinal cord lesions consistent with MS. C2-C3 there was a left lateral focus. Also at C2-C3 there was a posterior focus. At C3-C4 there was a right posterior lateral focus. At C4 there was a dorsal focus and at C5 there was another dorsal focus. 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 There are multilevel degenerative changes most pronounced at C7-T1 where there could be dynamic impingement of the right C8 nerve root.  2. MRI of the brain dated 09/28/2013 showing diffuse white matter foci in the periventricular and deep white matter consistent with [place].  Although there were no enhancing foci, when compared to a previous MRI dated 10/11/2010, there was some  progression.  3. An MRI of the lumbar spine dated 04/27/2015 showed a large disc extrusion with sequestered fragment causing bilateral L5 nerve root compression.   4. MRI of the brain with and without contrast dated 10/16/2008 showed enhancing lesions.  She was reportedly off medication at that time.   REVIEW OF SYSTEMS:  Constitutional: No fevers, chills, sweats, or change in appetite.   Fatigue, poor sleep Eyes: No visual changes, double vision, eye pain Ear, nose and throat: No hearing loss, ear pain, nasal congestion, sore throat Cardiovascular: No chest pain, palpitations Respiratory:  No shortness of breath at rest or with exertion.   No wheezes GastrointestinaI: No nausea, vomiting, diarrhea, abdominal pain, fecal incontinence Genitourinary:  No dysuria, urinary retention or frequency.  No nocturia. Musculoskeletal:  as above. Integumentary: No rash, pruritus, skin lesions Neurological: as above Psychiatric: No depression at this time.  No anxiety Endocrine: No palpitations, diaphoresis, change in appetite, change in weigh or increased thirst Hematologic/Lymphatic:  No anemia, purpura, petechiae. Allergic/Immunologic: No itchy/runny eyes, nasal congestion, recent allergic reactions, rashes  ALLERGIES: Allergies  Allergen Reactions  . Glatiramer Anaphylaxis    Generic Copaxone  . Lisinopril Anaphylaxis  . Buprenorphine Hcl Hives  . Morphine And Related Hives  . Aspirin Nausea And Vomiting    Fevers and GI upset  . Hydrocodone-Acetaminophen Nausea And Vomiting    Pt states can take Hydrocodone but has a reaction to a additive in vicodin  . Zithromax [Azithromycin] Rash    HOME MEDICATIONS: Outpatient Prescriptions Prior to Visit  Medication Sig Dispense Refill  . acetaminophen (TYLENOL) 500 MG tablet Take 1,000 mg by mouth every 6 (six) hours as needed for mild pain or moderate pain.    Marland Kitchen albuterol (PROVENTIL HFA;VENTOLIN HFA) 108 (90 BASE) MCG/ACT inhaler Inhale 1 puff  into the lungs as needed for wheezing or shortness of breath.    Marland Kitchen amitriptyline (ELAVIL) 75 MG tablet Take 1 tablet (75 mg total) by mouth at bedtime. 30 tablet 11  . B-D INS SYR ULTRAFINE 1CC/31G 31G X 5/16" 1 ML MISC See admin instructions.  0  . carvedilol (COREG) 25 MG tablet Take 25 mg by mouth 2 (two) times daily.     . cyclobenzaprine (FLEXERIL) 10 MG tablet Take 1 tablet (10 mg total) by mouth 3 (three) times daily as needed for muscle spasms. 30 tablet 1  . diazepam (VALIUM) 10 MG tablet Take 1 tablet (10 mg total) by mouth at bedtime as needed for anxiety. 30 tablet 5  . ferrous sulfate 325 (65 FE) MG tablet Take 325 mg by mouth daily.     . fluconazole (DIFLUCAN) 150 MG tablet TK 1 T PO  NOW  AND IF NOT BETTER REPEAT IN 3 DAYS  0  . fluticasone (FLONASE) 50 MCG/ACT nasal spray Place 2 sprays into both nostrils as needed for allergies.     Marland Kitchen gabapentin (NEURONTIN) 400 MG capsule Take 2 capsules (800 mg total) by mouth 4 (four) times daily. 240 capsule 11  . HYDROcodone-acetaminophen (NORCO) 10-325 MG tablet Take 1 tablet by mouth 4 (four) times daily as needed for moderate pain. 120 tablet 0  . insulin glargine (LANTUS) 100 UNIT/ML injection Inject into the skin at bedtime.    . interferon beta-1a (AVONEX) 30 MCG injection Inject 30 mcg into the muscle every 7 (seven) days. 12 each 3  . losartan-hydrochlorothiazide (HYZAAR) 100-25 MG per tablet Take 1 tablet by mouth daily.     . mupirocin ointment (BACTROBAN) 2 % APP SPARINGLY EXT AA BID  0  . pantoprazole (PROTONIX) 40 MG tablet Take 40 mg by mouth at bedtime.     . promethazine (PHENERGAN) 25 MG tablet Take 1 tablet (25 mg total) by mouth every 6 (six) hours as needed for nausea or vomiting. 30 tablet 1  . citalopram (CELEXA) 20 MG tablet Take 20 mg by mouth daily. Reported on 02/11/2016    . ezetimibe (ZETIA) 10 MG tablet Take 10 mg by mouth daily. Reported on 02/11/2016    . insulin aspart protamine- aspart (NOVOLOG MIX 70/30) (70-30)  100 UNIT/ML injection Inject into the skin. Reported on 02/11/2016    . NOVOLIN R 100 UNIT/ML injection Inject 15-20 Units into the skin daily as needed for high blood sugar. Reported on 02/11/2016  0   No facility-administered medications prior to visit.    PAST MEDICAL HISTORY: Past Medical History  Diagnosis Date  . Multiple sclerosis ([place])   . Headache   . Hypertension   . Neuropathy ([place])   . Vision abnormalities   . PONV (postoperative nausea and vomiting)     sometimes...........  Marland Kitchen Diabetes mellitus without complication (HCC)     dx at age 80  type 73  . Depression   . GERD (gastroesophageal reflux disease)   . Neuromuscular disorder ([place])     dx 2003 with MS  . Anemia     PAST SURGICAL HISTORY: Past Surgical History  Procedure Laterality Date  . Tonsillectomy    . Cholecystectomy    . Dental surgery    . Tubal ligation    . Right foot surgery      remove a piece of glass  (had c/o for about 1 yr)  . Lumbar laminectomy with coflex 1 level Bilateral 06/29/2015    Procedure: LUMBAR LAMINECTOMY WITH COFLEX 1 LEVEL;  Surgeon: Karie Chimera, MD;  Location: [facility];  Service: Neurosurgery;  Laterality: Bilateral;  LUMBAR LAMINECTOMY WITH COFLEX 1 LEVEL  . Wound exploration N/A 07/22/2015    Procedure: WOUND EXPLORATION;  Surgeon: Karie Chimera, MD;  Location: [facility];  Service: Neurosurgery;  Laterality: N/A;    FAMILY HISTORY: Family History  Problem Relation Age of Onset  . Hypertension Mother   . Diabetes Mother   . Stroke Mother   . Heart attack Mother   . Peripheral vascular disease Mother     SOCIAL HISTORY:  Social History   Social History  . Marital Status: Single    Spouse Name: N/A  . Number of Children: N/A  . Years of Education: N/A   Occupational History  . Not on file.   Social History Main Topics  . Smoking status: Current  Every Day Smoker -- 1.00 packs/day for 24 years    Types: Cigarettes  . Smokeless tobacco: Not on file  .  Alcohol Use: 0.0 oz/week    0 Standard drinks or equivalent per week     Comment: occasional  . Drug Use: No  . Sexual Activity: Not on file   Other Topics Concern  . Not on file   Social History Narrative     PHYSICAL EXAM  Filed Vitals:   02/11/16 1120  BP: 164/88  Pulse: 76  Resp: 16  Height: 6\' 1"  (1.854 m)  Weight: 245 lb 9.6 oz (111.403 kg)    Body mass index is 32.41 kg/(m^2).   General: The patient is well-developed and well-nourished and in no acute distress  Neck/back:  The neck is mildly tender,left > right.  Lower back has wound with minimal drainage.      Neurologic Exam  Mental status: The patient is alert and oriented x 3 at the time of the examination. The patient has apparent normal recent and remote memory, with an apparently normal attention span and concentration ability.   Speech is normal.  Cranial nerves: Extraocular movements are full. Facial symmetry is present. There is good facial sensation to soft touch bilaterally.Facial strength is normal.  Trapezius and sternocleidomastoid strength is normal. No dysarthria is noted.    Motor:  Muscle bulk and tone are normal. Strength is  5 / 5 in the right arm, 4+/5 in left ulnar innervated muscles.    Strength is 4/5  EHL strength  Sensory: Sensory testing shows decreased touch/temp in left ulnar distribution for hand and the the left L5   Dermatome and decreased vibration in feet/ankles bilaterally.      Coordination: Cerebellar testing reveals good finger-nose-finger but poor heel-to-shin bilaterally.  Gait and station: Station is wide.   Positive Romberg.   Her gait is wide stance and tandem gait is poor. She is doing better than at the last visit.  Reflexes: Deep tendon reflexes are symmetric in her arms and legs.Marland Kitchen     DIAGNOSTIC DATA (LABS, IMAGING, TESTING) - I reviewed patient records, labs, notes, testing and imaging myself where available.     ASSESSMENT AND PLAN   Multiple sclerosis  ([place]) - Plan: MR Cervical Spine W Wo Contrast, MR Brain W Wo Contrast  Gait disturbance - Plan: MR Cervical Spine W Wo Contrast, MR Brain W Wo Contrast  Dysesthesia  Lumbar radicular pain  Ulnar neuropathy of left upper extremity  Peripheral sensory neuropathy due to type 2 diabetes mellitus (HCC)  Chronic fatigue  Neck pain - Plan: MR Cervical Spine W Wo Contrast    1.  Continue Avonex for MS.   MRI brain and cervical spine to rule out subclinical progression.    If present, I will try to get her on a more effective medication 2.  Continue hydrocodone for pain.    Cymbalta for depression, may also help pain some. 3.   She is going to set up Ulnar surgery appt with ortho.   4. rtc 4  months or sooner if new or worsening neurologic symptoms..  45 minute face-to-face evaluation with greater than one half of the time counseling and coordinating care about her MS, related symptoms and musculoskeletal related issues.  Daneshia Tavano A. Felecia Shelling, MD, PhD [id number], 0000000 AM Certified in Neurology, Clinical Neurophysiology, Sleep Medicine, Pain Medicine and Neuroimaging  [facility] [address] [place],  [address] [phone]

## 2016-02-15 DIAGNOSIS — E010 Iodine-deficiency related diffuse (endemic) goiter: Secondary | ICD-10-CM | POA: Insufficient documentation

## 2016-02-15 DIAGNOSIS — E109 Type 1 diabetes mellitus without complications: Secondary | ICD-10-CM | POA: Insufficient documentation

## 2016-02-15 DIAGNOSIS — N63 Unspecified lump in unspecified breast: Secondary | ICD-10-CM | POA: Insufficient documentation

## 2016-02-15 DIAGNOSIS — R4189 Other symptoms and signs involving cognitive functions and awareness: Secondary | ICD-10-CM | POA: Insufficient documentation

## 2016-02-15 DIAGNOSIS — G43909 Migraine, unspecified, not intractable, without status migrainosus: Secondary | ICD-10-CM | POA: Insufficient documentation

## 2016-02-15 DIAGNOSIS — R260 Ataxic gait: Secondary | ICD-10-CM | POA: Insufficient documentation

## 2016-02-15 DIAGNOSIS — D509 Iron deficiency anemia, unspecified: Secondary | ICD-10-CM | POA: Insufficient documentation

## 2016-02-15 DIAGNOSIS — G5600 Carpal tunnel syndrome, unspecified upper limb: Secondary | ICD-10-CM | POA: Insufficient documentation

## 2016-02-15 DIAGNOSIS — F329 Major depressive disorder, single episode, unspecified: Secondary | ICD-10-CM | POA: Insufficient documentation

## 2016-02-15 DIAGNOSIS — K219 Gastro-esophageal reflux disease without esophagitis: Secondary | ICD-10-CM | POA: Insufficient documentation

## 2016-02-15 DIAGNOSIS — F419 Anxiety disorder, unspecified: Secondary | ICD-10-CM | POA: Insufficient documentation

## 2016-02-15 DIAGNOSIS — F32A Depression, unspecified: Secondary | ICD-10-CM | POA: Insufficient documentation

## 2016-02-19 DIAGNOSIS — M25532 Pain in left wrist: Secondary | ICD-10-CM | POA: Insufficient documentation

## 2016-02-20 DIAGNOSIS — Z794 Long term (current) use of insulin: Secondary | ICD-10-CM | POA: Insufficient documentation

## 2016-02-20 DIAGNOSIS — Z79899 Other long term (current) drug therapy: Secondary | ICD-10-CM | POA: Insufficient documentation

## 2016-02-20 DIAGNOSIS — G4700 Insomnia, unspecified: Secondary | ICD-10-CM | POA: Insufficient documentation

## 2016-02-20 DIAGNOSIS — D171 Benign lipomatous neoplasm of skin and subcutaneous tissue of trunk: Secondary | ICD-10-CM | POA: Insufficient documentation

## 2016-02-21 ENCOUNTER — Other Ambulatory Visit: Payer: Self-pay | Admitting: Neurology

## 2016-02-21 ENCOUNTER — Telehealth: Payer: Self-pay | Admitting: Neurology

## 2016-02-21 NOTE — Telephone Encounter (Signed)
Pt needs refill on promethazine (PHENERGAN) 25 MG tablet and is asking for a steroid as well. Thank you

## 2016-02-22 ENCOUNTER — Inpatient Hospital Stay: Admission: RE | Admit: 2016-02-22 | Payer: Medicaid Other | Source: Ambulatory Visit

## 2016-02-22 ENCOUNTER — Other Ambulatory Visit: Payer: Medicaid Other

## 2016-02-22 MED ORDER — PROMETHAZINE HCL 25 MG PO TABS: 25.0000 mg | ORAL_TABLET | Freq: Four times a day (QID) | ORAL | Status: AC | PRN

## 2016-02-22 NOTE — Telephone Encounter (Signed)
Patient called back to check status of refill requests for romethazine and steroid.
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
## 2016-02-23 DIAGNOSIS — G894 Chronic pain syndrome: Secondary | ICD-10-CM | POA: Insufficient documentation

## 2016-02-23 DIAGNOSIS — L97509 Non-pressure chronic ulcer of other part of unspecified foot with unspecified severity: Secondary | ICD-10-CM | POA: Insufficient documentation

## 2016-02-23 DIAGNOSIS — L03119 Cellulitis of unspecified part of limb: Secondary | ICD-10-CM | POA: Insufficient documentation

## 2016-02-23 DIAGNOSIS — F17200 Nicotine dependence, unspecified, uncomplicated: Secondary | ICD-10-CM | POA: Insufficient documentation

## 2016-02-23 DIAGNOSIS — IMO0002 Reserved for concepts with insufficient information to code with codable children: Secondary | ICD-10-CM | POA: Insufficient documentation

## 2016-02-23 DIAGNOSIS — R509 Fever, unspecified: Secondary | ICD-10-CM | POA: Insufficient documentation

## 2016-02-23 DIAGNOSIS — F17210 Nicotine dependence, cigarettes, uncomplicated: Secondary | ICD-10-CM | POA: Insufficient documentation

## 2016-02-24 DIAGNOSIS — A419 Sepsis, unspecified organism: Secondary | ICD-10-CM | POA: Insufficient documentation

## 2016-02-24 DIAGNOSIS — M869 Osteomyelitis, unspecified: Secondary | ICD-10-CM | POA: Insufficient documentation

## 2016-02-27 DIAGNOSIS — Z89429 Acquired absence of other toe(s), unspecified side: Secondary | ICD-10-CM | POA: Insufficient documentation

## 2016-02-27 DIAGNOSIS — Z89412 Acquired absence of left great toe: Secondary | ICD-10-CM | POA: Insufficient documentation

## 2016-03-06 DIAGNOSIS — Z9181 History of falling: Secondary | ICD-10-CM | POA: Insufficient documentation

## 2016-03-17 DIAGNOSIS — Z4889 Encounter for other specified surgical aftercare: Secondary | ICD-10-CM | POA: Insufficient documentation

## 2016-03-20 DIAGNOSIS — G5620 Lesion of ulnar nerve, unspecified upper limb: Secondary | ICD-10-CM | POA: Insufficient documentation

## 2016-03-27 ENCOUNTER — Telehealth: Payer: Self-pay | Admitting: Neurology

## 2016-03-27 MED ORDER — HYDROCODONE-ACETAMINOPHEN 10-325 MG PO TABS: 1.0000 | ORAL_TABLET | Freq: Four times a day (QID) | ORAL | Status: DC | PRN

## 2016-03-27 NOTE — Telephone Encounter (Signed)
Hydrocodone Rx. up front [facility]/fim

## 2016-03-27 NOTE — Telephone Encounter (Signed)
Pt requests refill for HYDROcodone-acetaminophen (NORCO) 10-325 MG tablet .

## 2016-03-27 NOTE — Telephone Encounter (Signed)
Rx. awaiting [name] sig/fim 

## 2016-03-29 ENCOUNTER — Telehealth: Payer: Self-pay | Admitting: Neurology
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
## 2016-03-29 NOTE — Telephone Encounter (Signed)
I have spoken with Natalya this afternoon and given appt. with [name] tomorrow am at [place], arrival time 0830/fim

## 2016-03-30 ENCOUNTER — Ambulatory Visit: Payer: Self-pay | Admitting: Neurology

## 2016-03-31 ENCOUNTER — Encounter: Payer: Self-pay | Admitting: Neurology

## 2016-04-06 ENCOUNTER — Ambulatory Visit (INDEPENDENT_AMBULATORY_CARE_PROVIDER_SITE_OTHER): Payer: Medicaid Other | Admitting: Neurology

## 2016-04-06 ENCOUNTER — Encounter: Payer: Self-pay | Admitting: Neurology

## 2016-04-06 VITALS — BP 146/78 | HR 88 | Resp 18 | Ht 73.0 in | Wt 246.5 lb

## 2016-04-06 DIAGNOSIS — R269 Unspecified abnormalities of gait and mobility: Secondary | ICD-10-CM | POA: Diagnosis not present

## 2016-04-06 DIAGNOSIS — G35 Multiple sclerosis: Secondary | ICD-10-CM

## 2016-04-06 DIAGNOSIS — F329 Major depressive disorder, single episode, unspecified: Secondary | ICD-10-CM | POA: Diagnosis not present

## 2016-04-06 DIAGNOSIS — R208 Other disturbances of skin sensation: Secondary | ICD-10-CM

## 2016-04-06 DIAGNOSIS — E1142 Type 2 diabetes mellitus with diabetic polyneuropathy: Secondary | ICD-10-CM | POA: Diagnosis not present

## 2016-04-06 DIAGNOSIS — R5382 Chronic fatigue, unspecified: Secondary | ICD-10-CM

## 2016-04-06 DIAGNOSIS — L02619 Cutaneous abscess of unspecified foot: Secondary | ICD-10-CM | POA: Insufficient documentation

## 2016-04-06 DIAGNOSIS — F32A Depression, unspecified: Secondary | ICD-10-CM

## 2016-04-06 DIAGNOSIS — A491 Streptococcal infection, unspecified site: Secondary | ICD-10-CM | POA: Insufficient documentation
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
## 2016-04-11 ENCOUNTER — Ambulatory Visit: Payer: Medicaid Other | Admitting: Neurology

## 2016-04-18 ENCOUNTER — Other Ambulatory Visit: Payer: Medicaid Other

## 2016-04-24 ENCOUNTER — Ambulatory Visit
Admission: RE | Admit: 2016-04-24 | Discharge: 2016-04-24 | Disposition: A | Discharge status: Home / Self Care | Payer: Medicaid Other | Source: Ambulatory Visit | Attending: Neurology | Admitting: Neurology

## 2016-04-24 DIAGNOSIS — R269 Unspecified abnormalities of gait and mobility: Secondary | ICD-10-CM

## 2016-04-24 DIAGNOSIS — G35 Multiple sclerosis: Secondary | ICD-10-CM

## 2016-04-24 MED ORDER — GADOBENATE DIMEGLUMINE 529 MG/ML IV SOLN
20.0000 mL | Freq: Once | INTRAVENOUS | Status: AC | PRN
  Administered 2016-04-24: 20 mL via INTRAVENOUS

## 2016-04-25 ENCOUNTER — Telehealth: Payer: Self-pay | Admitting: Neurology

## 2016-04-25 MED ORDER — HYDROCODONE-ACETAMINOPHEN 10-325 MG PO TABS: 1.0000 | ORAL_TABLET | Freq: Four times a day (QID) | ORAL | Status: DC | PRN

## 2016-04-25 NOTE — Telephone Encounter (Signed)
Hydrocodone rx. up front [facility]/fim 

## 2016-04-25 NOTE — Telephone Encounter (Signed)
Rx. awaiting [name] sig/fim 

## 2016-04-25 NOTE — Telephone Encounter (Signed)
Patient called to request refill of HYDROcodone-acetaminophen (NORCO) 10-325 MG tablet °

## 2016-04-26 ENCOUNTER — Telehealth: Payer: Self-pay | Admitting: *Deleted

## 2016-04-26 NOTE — Telephone Encounter (Signed)
-----   Message from Britt Bottom, MD sent at 04/25/2016  5:39 PM EDT ----- Please let her know the MRI's of the brain and cervical spine are unchanged form 2015 MRI's    No new lesions

## 2016-04-26 NOTE — Telephone Encounter (Signed)
LMOM (identified vm) that per [name], MRI of brain and c-spine looked the same as they did in 2015--there are no new MS lesions.  She does not need to return this call unless she has questions/fim

## 2016-05-03 ENCOUNTER — Telehealth: Payer: Self-pay | Admitting: Neurology

## 2016-05-03 MED ORDER — CYCLOBENZAPRINE HCL 10 MG PO TABS: 10.0000 mg | ORAL_TABLET | Freq: Three times a day (TID) | ORAL | Status: DC | PRN

## 2016-05-03 MED ORDER — DIAZEPAM 10 MG PO TABS: 10.0000 mg | ORAL_TABLET | Freq: Every evening | ORAL | Status: DC | PRN

## 2016-05-03 NOTE — Telephone Encounter (Signed)
Patient is calling to order the following medications to be sent to [facility], [address], [place], [place]: Amitriptyline 75 mg.  Cyclobenzaprine 10 mg.  Diazepam 10 mg.  Angela Solomon

## 2016-05-26 ENCOUNTER — Telehealth: Payer: Self-pay | Admitting: Neurology

## 2016-05-26 MED ORDER — HYDROCODONE-ACETAMINOPHEN 10-325 MG PO TABS: 1.0000 | ORAL_TABLET | Freq: Four times a day (QID) | ORAL | Status: DC | PRN

## 2016-05-26 NOTE — Telephone Encounter (Signed)
Patient requesting refill of HYDROcodone-acetaminophen (NORCO) 10-325 MG tablet °Pharmacy: pick up ° °

## 2016-05-26 NOTE — Telephone Encounter (Signed)
Hydrocodone rx. up front [facility]/fim 

## 2016-05-26 NOTE — Telephone Encounter (Signed)
Rx. awaiting [name] sig/fim 

## 2016-05-31 ENCOUNTER — Telehealth: Payer: Self-pay | Admitting: Neurology

## 2016-05-31 NOTE — Telephone Encounter (Signed)
Patient requesting refill of gabapentin (NEURONTIN) 400 MG capsule Pharmacy:[id number] - [place], [place] - [address] AT [place]

## 2016-05-31 NOTE — Telephone Encounter (Signed)
Refills available at pharmacy/fim 

## 2016-06-13 ENCOUNTER — Ambulatory Visit: Payer: Medicaid Other | Admitting: Neurology

## 2016-06-13 ENCOUNTER — Telehealth: Payer: Self-pay | Admitting: *Deleted

## 2016-06-13 NOTE — Telephone Encounter (Signed)
Pt records faxed to [facility] on 06/13/16 attn [facility].

## 2016-06-28 ENCOUNTER — Telehealth: Payer: Self-pay | Admitting: Neurology

## 2016-06-28 MED ORDER — HYDROCODONE-ACETAMINOPHEN 10-325 MG PO TABS: 1.0000 | ORAL_TABLET | Freq: Four times a day (QID) | ORAL | Status: DC | PRN

## 2016-06-28 MED ORDER — CYCLOBENZAPRINE HCL 10 MG PO TABS: 10.0000 mg | ORAL_TABLET | Freq: Three times a day (TID) | ORAL | Status: DC | PRN

## 2016-06-28 NOTE — Telephone Encounter (Signed)
Patient requesting refill of HYDROcodone-acetaminophen (NORCO) 10-325 MG tablet , cyclobenzaprine (FLEXERIL) 10 MG tablet Pharmacy: pick up

## 2016-06-28 NOTE — Telephone Encounter (Signed)
Hydrocodone rx. up front [facility]/fim 

## 2016-06-28 NOTE — Telephone Encounter (Signed)
Rx. awaiting YY sig, as [name] is ooo this week/fim 

## 2016-07-10 DIAGNOSIS — M7532 Calcific tendinitis of left shoulder: Secondary | ICD-10-CM | POA: Insufficient documentation

## 2016-07-10 DIAGNOSIS — M7531 Calcific tendinitis of right shoulder: Secondary | ICD-10-CM | POA: Insufficient documentation

## 2016-07-26 ENCOUNTER — Telehealth: Payer: Self-pay | Admitting: Neurology

## 2016-07-26 NOTE — Telephone Encounter (Signed)
Patient requesting refill of  HYDROcodone-acetaminophen (NORCO) 10-325 MG tablet . ° ° °

## 2016-07-27 MED ORDER — HYDROCODONE-ACETAMINOPHEN 10-325 MG PO TABS: 1.0000 | ORAL_TABLET | Freq: Four times a day (QID) | ORAL | 0 refills | Status: DC | PRN

## 2016-07-27 NOTE — Telephone Encounter (Signed)
Ok to refill 

## 2016-07-27 NOTE — Telephone Encounter (Signed)
Patient called to check status of refill request for Hydrocodone

## 2016-07-27 NOTE — Telephone Encounter (Signed)
LVM informing patient refill has been printed, waiting for signature. Advised she call before she comes to pick up at front desk, bring photo ID. Gave office hours and number.

## 2016-08-08 ENCOUNTER — Ambulatory Visit: Payer: Medicaid Other | Admitting: Neurology

## 2016-08-10 ENCOUNTER — Ambulatory Visit (INDEPENDENT_AMBULATORY_CARE_PROVIDER_SITE_OTHER): Payer: Medicaid Other | Admitting: Neurology

## 2016-08-10 ENCOUNTER — Encounter: Payer: Self-pay | Admitting: Neurology

## 2016-08-10 VITALS — BP 114/76 | HR 78 | Resp 16 | Ht 73.0 in | Wt 262.0 lb

## 2016-08-10 DIAGNOSIS — M542 Cervicalgia: Secondary | ICD-10-CM

## 2016-08-10 DIAGNOSIS — G5622 Lesion of ulnar nerve, left upper limb: Secondary | ICD-10-CM

## 2016-08-10 DIAGNOSIS — G894 Chronic pain syndrome: Secondary | ICD-10-CM | POA: Diagnosis not present

## 2016-08-10 DIAGNOSIS — G35 Multiple sclerosis: Secondary | ICD-10-CM

## 2016-08-10 DIAGNOSIS — E1142 Type 2 diabetes mellitus with diabetic polyneuropathy: Secondary | ICD-10-CM

## 2016-08-10 DIAGNOSIS — R269 Unspecified abnormalities of gait and mobility: Secondary | ICD-10-CM | POA: Diagnosis not present

## 2016-08-10 DIAGNOSIS — M25512 Pain in left shoulder: Secondary | ICD-10-CM

## 2016-08-10 MED ORDER — INTERFERON BETA-1A 30 MCG IM KIT: 30.0000 ug | PACK | INTRAMUSCULAR | 3 refills | Status: DC
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
## 2016-08-14 ENCOUNTER — Telehealth: Payer: Self-pay | Admitting: Neurology

## 2016-08-14 NOTE — Telephone Encounter (Signed)
[facility] [phone] called to request insurance information for Avonex Rx.

## 2016-08-14 NOTE — Telephone Encounter (Signed)
Copy of Medicaid card faxed to [facility] fax# [phone]

## 2016-08-14 NOTE — Telephone Encounter (Signed)
Pt called said the injections she got on Thursday 8/17 did not help with the pain. She said has laid around all weekend but she is still in pain. She has been taking hydrocodone as directed but it is not helping. Please call  She also asking for cyclobenzaprine (FLEXERIL) 10 MG tablet . she has changed pharmacy from [facility] to [facility]

## 2016-08-15 ENCOUNTER — Telehealth: Payer: Self-pay | Admitting: Neurology

## 2016-08-15 NOTE — Telephone Encounter (Signed)
Patient called to advise, [facility] was able to transfer cyclobenzaprine (FLEXERIL) 10 MG tablet prescription to [place]. Patient also adds that she received shots on Thursday for back and shoulder and still has no relief.

## 2016-08-15 NOTE — Telephone Encounter (Signed)
Angela Solomon / Angela Solomon called for clarification on interferon beta-1a (AVONEX) 30 MCG injection. Will they use pen or prefilled syringe. May call [phone]

## 2016-08-15 NOTE — Telephone Encounter (Signed)
I have spoken with [facility] rep and advised ok to stay with prefilled syringe-this is what was last dispensed to [facility], and she has not requested a change/fim

## 2016-08-15 NOTE — Telephone Encounter (Signed)
I have spoken with Angela Solomon.  She sts. left shoulder pain is improving but still present.  Also still having some lower back pain.  She just received tpi's.  I have explained that she should still be getting benefit from the steroids.  It is a good sign that her shoulder is improving and hopefully it will continue to do so.  She will call back if pain worsens or continues over the next couple of weeks/fim

## 2016-08-25 ENCOUNTER — Telehealth: Payer: Self-pay | Admitting: Neurology

## 2016-08-25 DIAGNOSIS — G35 Multiple sclerosis: Secondary | ICD-10-CM

## 2016-08-25 DIAGNOSIS — G894 Chronic pain syndrome: Secondary | ICD-10-CM

## 2016-08-25 NOTE — Telephone Encounter (Signed)
Patient requesting refill of amitriptyline (ELAVIL) 75 MG tablet, gabapentin (NEURONTIN) 400 MG capsule , HYDROcodone-acetaminophen (NORCO) 10-325 MG tablet Pharmacy: [place] [facility] [address] [phone]   Update: pt also says injections do not seem to be helping. She has a lot of stiffness. Pain is still there. Back pain is very bad. She has appt with chiropractor as well. Please call and advise

## 2016-08-29 MED ORDER — AMITRIPTYLINE HCL 75 MG PO TABS: 75.0000 mg | ORAL_TABLET | Freq: Every day | ORAL | 11 refills | Status: DC

## 2016-08-29 MED ORDER — HYDROCODONE-ACETAMINOPHEN 10-325 MG PO TABS: 1.0000 | ORAL_TABLET | Freq: Four times a day (QID) | ORAL | 0 refills | Status: DC | PRN

## 2016-08-29 NOTE — Telephone Encounter (Signed)
LMOM.  [name] is ooo, so Hydrocodone rx. will be ready tomorrow when he returns to the office.  Amitriptyline has been sent to [facility] this am.  Gabapentin was last r/f 12-07-15, with 11 additional r/f, so she should have r/f thru December this yr, available at her pharmacy.  If she needs another appt. with [name], for back pain, we can schedule that for her./fim

## 2016-08-30 ENCOUNTER — Other Ambulatory Visit (INDEPENDENT_AMBULATORY_CARE_PROVIDER_SITE_OTHER): Payer: Self-pay

## 2016-08-30 DIAGNOSIS — G894 Chronic pain syndrome: Secondary | ICD-10-CM

## 2016-08-30 DIAGNOSIS — G35 Multiple sclerosis: Secondary | ICD-10-CM

## 2016-08-30 DIAGNOSIS — Z0289 Encounter for other administrative examinations: Secondary | ICD-10-CM

## 2016-08-30 NOTE — Telephone Encounter (Signed)
I have spoken with Angela Solomon this afternoon and again advised that I am not able to obtain a PA for Hydrocodone.  Medicaid has new rules requiring the use of opioid pain med, requiring PA's for these medications.  I have offered referral to a pain mx. center, but she has declined at this time, stating she will pay cash for Hydrocodone/fim

## 2016-08-30 NOTE — Telephone Encounter (Signed)
Patient is calling. She went to get her Rx for HYDROcodone-acetaminophen (NORCO) 10-325 MG tablet filled and the pharmacy would not fill until they had authorization from the doctor. This is something new from Medicaid the pharmacy said. Please call [facility] in [facility] on the corner of [address] and discuss.

## 2016-08-30 NOTE — Telephone Encounter (Signed)
Hydrocodone rx. up front [facility].  UDS ordered/fim

## 2016-08-30 NOTE — Telephone Encounter (Signed)
I have spoken with Angela Solomon today, and advised that per the pharmacy, opiate pan meds for all Medicaid pt's now require a PA.  We are not able to obtain this PA.  She may still have rx. filled, but will have to pay cash price for it.  She verbalized understanding of same/fim

## 2016-08-30 NOTE — Telephone Encounter (Signed)
Pt called said she got a 14 day supply of medication from the pharmacy so medicaid will pay for it. She said she was told by [facility] to call and request PA to be done. Please call

## 2016-09-04 LAB — MONITOR DRUG PROFILE 10(MW)
AMPHETAMINE SCREEN URINE: NEGATIVE ng/mL
BARBITURATE SCREEN URINE: NEGATIVE ng/mL
Cocaine (Metab) Scrn, Ur: NEGATIVE ng/mL
Creatinine(Crt), U: 112.2 mg/dL (ref 20.0–300.0)
Methadone Screen, Urine: NEGATIVE ng/mL
OPIATE SCREEN URINE: NEGATIVE ng/mL
OXYCODONE+OXYMORPHONE UR QL SCN: NEGATIVE ng/mL
PHENCYCLIDINE QUANTITATIVE URINE: NEGATIVE ng/mL
Ph of Urine: 6.1 (ref 4.5–8.9)
Propoxyphene Scrn, Ur: NEGATIVE ng/mL

## 2016-09-04 LAB — BENZODIAZEPINES CONFIRM, URINE
ALPRAZOLAM: NEGATIVE
BENZODIAZEPINES: POSITIVE ng/mL — AB
CLONAZEPAM: NEGATIVE
Flurazepam: NEGATIVE
LORAZEPAM: NEGATIVE
MIDAZOLAM: NEGATIVE
NORDIAZEPAM CONFIRM: 276 ng/mL
NORDIAZEPAM: POSITIVE — AB
OXAZEPAM GC/MS CONF: 596 ng/mL
OXAZEPAM UR: POSITIVE — AB
TEMAZEPAM CONFIRM: 575 ng/mL
TEMAZEPAM: POSITIVE — AB
TRIAZOLAM: NEGATIVE

## 2016-09-04 LAB — CANNABINOID (GC/MS), URINE
CARBOXY THC UR: 354 ng/mL
Cannabinoid: POSITIVE — AB

## 2016-09-05 ENCOUNTER — Telehealth: Payer: Self-pay | Admitting: *Deleted

## 2016-09-05 ENCOUNTER — Telehealth: Payer: Self-pay | Admitting: Neurology

## 2016-09-05 DIAGNOSIS — M545 Low back pain: Principal | ICD-10-CM

## 2016-09-05 DIAGNOSIS — G8929 Other chronic pain: Secondary | ICD-10-CM

## 2016-09-05 NOTE — Telephone Encounter (Signed)
-----   Message from Britt Bottom, MD sent at 09/04/2016  5:14 PM EDT ----- Please let her know that the urine drug screen was inconsistent not showing any opiates despite being prescribed a very high dose of 120 pills per month. Additionally she had cannabis.[facility] has changed their lines to doctors for opiate prescriptions. It is no longer appropriate for me to prescribe hydrocodone due to not using as prescribed and having marijuana use

## 2016-09-05 NOTE — Telephone Encounter (Signed)
Patient called requesting call back from nurse Faith regarding lab results from urine test done prior to picking up Rx for Hydrocodone, "not understanding the results". Also states she is still in a lot of pain after steroid shot, "really, really hurting her". Please call [phone].

## 2016-09-05 NOTE — Telephone Encounter (Signed)
I have spoken with Angela Solomon this morning, and per [name], explained that [place] has new guidelines for physician's who rx. opiate pain meds.  I explained that her UDS did not show any opiates, and therefore, he would not be able to rx. these meds for her.  She verbalized understanding of same./fim

## 2016-09-05 NOTE — Telephone Encounter (Signed)
I have spoken with Farrel this morning.  See result note.  Her uds showed no opiates but did show marijuana.  [name] is no longer able to rx. opiate pain meds for her.  She verbalized understanding of same, sts. back pain is worse, despite tpi's given at last ov.  Per [name], ok to refer to a pain mx. clinic.  She is agreeable with this.  As she lives in [place], referral to [facility] pain mx. ordered/fim

## 2016-09-25 DIAGNOSIS — E1029 Type 1 diabetes mellitus with other diabetic kidney complication: Secondary | ICD-10-CM | POA: Insufficient documentation

## 2016-09-25 DIAGNOSIS — E559 Vitamin D deficiency, unspecified: Secondary | ICD-10-CM | POA: Insufficient documentation

## 2016-12-12 ENCOUNTER — Encounter: Payer: Self-pay | Admitting: Neurology

## 2016-12-12 ENCOUNTER — Ambulatory Visit (INDEPENDENT_AMBULATORY_CARE_PROVIDER_SITE_OTHER): Payer: Medicare Other | Admitting: Neurology

## 2016-12-12 DIAGNOSIS — R208 Other disturbances of skin sensation: Secondary | ICD-10-CM

## 2016-12-12 DIAGNOSIS — G8929 Other chronic pain: Secondary | ICD-10-CM

## 2016-12-12 DIAGNOSIS — E1142 Type 2 diabetes mellitus with diabetic polyneuropathy: Secondary | ICD-10-CM

## 2016-12-12 DIAGNOSIS — R3915 Urgency of urination: Secondary | ICD-10-CM

## 2016-12-12 DIAGNOSIS — M5440 Lumbago with sciatica, unspecified side: Secondary | ICD-10-CM

## 2016-12-12 DIAGNOSIS — F331 Major depressive disorder, recurrent, moderate: Secondary | ICD-10-CM

## 2016-12-12 DIAGNOSIS — G35 Multiple sclerosis: Secondary | ICD-10-CM

## 2016-12-12 DIAGNOSIS — R269 Unspecified abnormalities of gait and mobility: Secondary | ICD-10-CM

## 2016-12-12 DIAGNOSIS — M542 Cervicalgia: Secondary | ICD-10-CM

## 2016-12-12 MED ORDER — HYDROCODONE-ACETAMINOPHEN 10-325 MG PO TABS: 1.0000 | ORAL_TABLET | Freq: Every evening | ORAL | 0 refills | Status: DC | PRN

## 2016-12-12 NOTE — Progress Notes (Signed)
[facility]  PATIENT: Angela Solomon DOB: Sep 26, 1976  REFERRING CLINICIAN: Dr. Rita Ohara  HISTORY FROM: patient REASON FOR VISIT: MS   HISTORICAL  CHIEF COMPLAINT:  Chief Complaint  Patient presents with  . Multiple Sclerosis    Sts. she continues to tolerate Avonex well.  Sts. 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  Sts. still having left arm/elbow pain; has not had surgery for ulnar neuropathy yet/fim    HISTORY OF PRESENT ILLNESS:   Angela Solomon is a 40 year old woman with relapsing remitting multiple sclerosis.  Her MS has been stable on Avonex.     MS:   She is currently taking Avonex and tolerates it well.  She has not had any definite exacerbations recently.    Gait/strength:   Gait was better after surgery but she feels it is worse again and she is using a walker again.   She has had some right leg weakness already.   The right foot is weaker than the left.    She feels gait is worse since the right 5th digit was amputated.    Sensation:  She has numbness and pain still in both feet, worse on the right.   She has diabetes and has a superimposed diabetic polyneuropathy with her radicular symptoms.   Amitriptyline and gabapentin helps her dysesthesia.     Bladder:   She feels she is doing well.   She has some frequency and urgency but no incontinence.  Vision:    She denies any MS related visual changes  Mood/Cognition:      She notes depression > anxiety.   She does not think the Cymbalta has helped much.    She gets upset easily.     Fatigue/sleep:   She is always tired and this worsens as the day goes on.    She feels sleepy but tries not to nap.   She sleeps ok - some trouble falling asleep more than with sleep maintenance.   Lower Back Pain: In July 2016 she had surgery for a lumbar radiculopathy (Dr. Hal Neer).   She had a large disc herniation at L4-L5.     Pain improved after surgery but she notes some continued LBP and feels ROM is  worse.    Neck Pain/shoulder pain:   She feels the neck pain is about the same.   NSAIDs help a little bit but incompletely.   Pain is left > right and goes to her shoulder.    She saw orthopedics earlier this year  who did not think there was any major problem with shoulder  ---------------------------------------  MS History:   She presented in 2003 with gait ataxia, slurred speech and right sided paresthesias. Imaging studies were performed and she was diagnosed with multiple sclerosis. Initially, she was placed on Rebif but had skin reactions and switched to Copaxone. On Copaxone, she had an immediate hypersensitivity response and it was discontinued. She has been on Avonex most of the time but has been off for months at a time when she has had insurance difficulties.      Review of MRI's: 1. MRI of the cervical spine dated 05/20/2014. It showed enhancing spinal cord lesions consistent with MS. C2-C3 there was a left lateral focus. Also at C2-C3 there was a posterior focus. At C3-C4 there was a right posterior lateral focus. At C4 there was a dorsal focus and at C5 there was another dorsal focus. 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 There are multilevel degenerative changes most pronounced at C7-T1 where there could be dynamic impingement of the right C8 nerve root.  2. MRI of the brain dated 09/28/2013 showing diffuse white matter foci in the periventricular and deep white matter consistent with [place].  Although there were no enhancing foci, when compared to a previous MRI dated 10/11/2010, there was some progression.  3. An MRI of the lumbar spine dated 04/27/2015 showed a large disc extrusion with sequestered fragment causing bilateral L5 nerve root compression.   4. MRI of the brain with and without contrast dated 10/16/2008 showed enhancing lesions.  She was reportedly off medication at that time.   REVIEW OF SYSTEMS:  Constitutional: No fevers, chills, sweats, or change in appetite.    Fatigue, poor sleep Eyes: No visual changes, double vision, eye pain Ear, nose and throat: No hearing loss, ear pain, nasal congestion, sore throat Cardiovascular: No chest pain, palpitations Respiratory:  No shortness of breath at rest or with exertion.   No wheezes GastrointestinaI: No nausea, vomiting, diarrhea, abdominal pain, fecal incontinence Genitourinary:  No dysuria, urinary retention or frequency.  No nocturia. Musculoskeletal:  as above. Integumentary: No rash, pruritus, skin lesions Neurological: as above Psychiatric: No depression at this time.  No anxiety Endocrine: No palpitations, diaphoresis, change in appetite, change in weigh or increased thirst Hematologic/Lymphatic:  No anemia, purpura, petechiae. Allergic/Immunologic: No itchy/runny eyes, nasal congestion, recent allergic reactions, rashes  ALLERGIES: Allergies  Allergen Reactions  . Atorvastatin Other (See Comments)    Myalgia Myalgia  . Glatiramer Anaphylaxis    Generic Copaxone  . Lisinopril Anaphylaxis  . Niacin Diarrhea  . Valproic Acid Anaphylaxis  . Buprenorphine Hcl Hives  . Morphine And Related Hives  . Aspirin Nausea And Vomiting    Fevers and GI upset  . Hydrocodone-Acetaminophen Nausea And Vomiting    Pt states can take Hydrocodone but has a reaction to a additive in vicodin  . Zithromax [Azithromycin] Rash    HOME MEDICATIONS: Outpatient Medications Prior to Visit  Medication Sig Dispense Refill  . acetaminophen (TYLENOL) 500 MG tablet Take 1,000 mg by mouth every 6 (six) hours as needed for mild pain or moderate pain.    Marland Kitchen albuterol (PROVENTIL HFA;VENTOLIN HFA) 108 (90 BASE) MCG/ACT inhaler Inhale 1 puff into the lungs as needed for wheezing or shortness of breath. Reported on 04/06/2016    . amitriptyline (ELAVIL) 75 MG tablet Take 1 tablet (75 mg total) by mouth at bedtime. 30 tablet 11  . amLODipine (NORVASC) 5 MG tablet Take 5 mg by mouth.    . B-D INS SYR ULTRAFINE 1CC/31G 31G X 5/16"  1 ML MISC See admin instructions.  0  . carvedilol (COREG) 25 MG tablet Take 25 mg by mouth 2 (two) times daily.     . cyclobenzaprine (FLEXERIL) 10 MG tablet Take 1 tablet (10 mg total) by mouth 3 (three) times daily as needed for muscle spasms. 30 tablet 1  . diazepam (VALIUM) 10 MG tablet Take 1 tablet (10 mg total) by mouth at bedtime as needed for anxiety. 30 tablet 5  . DULoxetine (CYMBALTA) 60 MG capsule Take 1 capsule (60 mg total) by mouth daily. 30 capsule 11  . ferrous sulfate 325 (65 FE) MG tablet Take 325 mg by mouth daily.     . fluconazole (DIFLUCAN) 150 MG tablet TK 1 T PO  NOW AND IF NOT BETTER REPEAT IN 3 DAYS  0  . fluticasone (FLONASE) 50 MCG/ACT nasal  spray Place 2 sprays into both nostrils as needed for allergies.     Marland Kitchen gabapentin (NEURONTIN) 400 MG capsule Take 2 capsules (800 mg total) by mouth 4 (four) times daily. 240 capsule 11  . insulin glargine (LANTUS) 100 UNIT/ML injection Inject into the skin at bedtime.    . interferon beta-1a (AVONEX) 30 MCG injection Inject 30 mcg into the muscle every 7 (seven) days. 12 each 3  . losartan-hydrochlorothiazide (HYZAAR) 100-25 MG per tablet Take 1 tablet by mouth daily.     . pantoprazole (PROTONIX) 40 MG tablet Take 40 mg by mouth at bedtime.     . promethazine (PHENERGAN) 25 MG tablet Take 1 tablet (25 mg total) by mouth every 6 (six) hours as needed for nausea or vomiting. 30 tablet 1  . HYDROcodone-acetaminophen (NORCO) 10-325 MG tablet Take 1 tablet by mouth 4 (four) times daily as needed for moderate pain. 120 tablet 0   No facility-administered medications prior to visit.     PAST MEDICAL HISTORY: Past Medical History:  Diagnosis Date  . Anemia   . Depression   . Diabetes mellitus without complication (HCC)    dx at age 83  type 35  . GERD (gastroesophageal reflux disease)   . Headache   . Hypertension   . Multiple sclerosis ([place])   . Neuromuscular disorder ([place])    dx 2003 with MS  . Neuropathy ([place])   . PONV  (postoperative nausea and vomiting)    sometimes...........  Marland Kitchen Vision abnormalities     PAST SURGICAL HISTORY: Past Surgical History:  Procedure Laterality Date  . CHOLECYSTECTOMY    . DENTAL SURGERY    . LUMBAR LAMINECTOMY WITH COFLEX 1 LEVEL Bilateral 06/29/2015   Procedure: LUMBAR LAMINECTOMY WITH COFLEX 1 LEVEL;  Surgeon: Karie Chimera, MD;  Location: [facility];  Service: Neurosurgery;  Laterality: Bilateral;  LUMBAR LAMINECTOMY WITH COFLEX 1 LEVEL  . right foot surgery     remove a piece of glass  (had c/o for about 1 yr)  . TONSILLECTOMY    . TUBAL LIGATION    . WOUND EXPLORATION N/A 07/22/2015   Procedure: WOUND EXPLORATION;  Surgeon: Karie Chimera, MD;  Location: [facility];  Service: Neurosurgery;  Laterality: N/A;    FAMILY HISTORY: Family History  Problem Relation Age of Onset  . Hypertension Mother   . Diabetes Mother   . Stroke Mother   . Heart attack Mother   . Peripheral vascular disease Mother     SOCIAL HISTORY:  Social History   Social History  . Marital status: Single    Spouse name: N/A  . Number of children: N/A  . Years of education: N/A   Occupational History  . Not on file.   Social History Main Topics  . Smoking status: Current Every Day Smoker    Packs/day: 1.00    Years: 24.00    Types: Cigarettes  . Smokeless tobacco: Not on file  . Alcohol use 0.0 oz/week     Comment: occasional  . Drug use: No  . Sexual activity: Not on file   Other Topics Concern  . Not on file   Social History Narrative  . No narrative on file     PHYSICAL EXAM  There were no vitals filed for this visit.  There is no height or weight on file to calculate BMI.   General: The patient is well-developed and well-nourished and in no acute distress  Musculoskeletal:  Left shoulder is tender over  trapezius > subacromial bursa and ROM is reduced.     Neck:  The lower neck is tender,left > right.  Good ROM  Lower Back:  Tender over right lower lumbar  paraspinal muscles and piriformis muscle.      Neurologic Exam  Mental status: The patient is alert and oriented x 3 at the time of the examination. Depressed affect.  The patient has apparent normal recent and remote memory, with an apparently normal attention span and concentration ability.   Speech is normal.  Cranial nerves: Extraocular movements are full.  There is good facial sensation to soft touch bilaterally.Facial strength is normal.  Trapezius and sternocleidomastoid strength is normal. No dysarthria is noted.    Motor:  Muscle bulk and tone are normal. Strength is  5 / 5 in the right arm, 4+/5 in left ulnar innervated muscles.     4+/5 EDB bilat  Sensory: Sensory testing shows decreased touch/temp in left ulnar distribution for hand and the the left L5   Dermatome and decreased vibration in feet/ankles bilaterally.      Coordination: Cerebellar testing reveals good finger-nose-finger but poor heel-to-shin bilaterally.  Gait and station: Station is wide.   Positive Romberg.   Her gait is wide stance and tandem gait is poor.     Reflexes: Deep tendon reflexes are symmetric in her arms and legs (trace ankle).Marland Kitchen     DIAGNOSTIC DATA (LABS, IMAGING, TESTING) - I reviewed patient records, labs, notes, testing and imaging myself where available.     ASSESSMENT AND PLAN   Multiple sclerosis ([place])  Diabetic polyneuropathy associated with type 2 diabetes mellitus ([place])  Major depressive disorder, recurrent episode, moderate (HCC)  Chronic bilateral low back pain with sciatica, sciatica laterality unspecified  Dysesthesia  Urinary urgency  Gait disturbance  Neck pain    1.  Continue Avonex for MS.  2.  Continue meds for  for pain and mood.   Hydrocodone at bedtime 3.  Advised to go to ED if ever gets suicidal thouhts.  COntinue antidepressants. 4.   rtc 6  months or sooner if new or worsening neurologic symptoms.Angela Solomon. Felecia Shelling, MD, PhD [id number], [id number]  AM Certified in Neurology, Clinical Neurophysiology, Sleep Medicine, Pain Medicine and Neuroimaging  [facility] [address], [place], [place] [address] [phone]

## 2016-12-14 ENCOUNTER — Encounter (HOSPITAL_BASED_OUTPATIENT_CLINIC_OR_DEPARTMENT_OTHER): Payer: Self-pay | Admitting: Emergency Medicine

## 2016-12-14 ENCOUNTER — Emergency Department (HOSPITAL_BASED_OUTPATIENT_CLINIC_OR_DEPARTMENT_OTHER)
Admission: EM | Admit: 2016-12-14 | Discharge: 2016-12-14 | Disposition: A | Discharge status: Home / Self Care | Payer: Medicaid Other | Attending: Emergency Medicine | Admitting: Emergency Medicine

## 2016-12-14 DIAGNOSIS — F17210 Nicotine dependence, cigarettes, uncomplicated: Secondary | ICD-10-CM | POA: Diagnosis not present

## 2016-12-14 DIAGNOSIS — Y999 Unspecified external cause status: Secondary | ICD-10-CM | POA: Diagnosis not present

## 2016-12-14 DIAGNOSIS — W228XXA Striking against or struck by other objects, initial encounter: Secondary | ICD-10-CM | POA: Insufficient documentation

## 2016-12-14 DIAGNOSIS — Y929 Unspecified place or not applicable: Secondary | ICD-10-CM | POA: Diagnosis not present

## 2016-12-14 DIAGNOSIS — I10 Essential (primary) hypertension: Secondary | ICD-10-CM | POA: Insufficient documentation

## 2016-12-14 DIAGNOSIS — Y939 Activity, unspecified: Secondary | ICD-10-CM | POA: Insufficient documentation

## 2016-12-14 DIAGNOSIS — Z794 Long term (current) use of insulin: Secondary | ICD-10-CM | POA: Diagnosis not present

## 2016-12-14 DIAGNOSIS — S0502XA Injury of conjunctiva and corneal abrasion without foreign body, left eye, initial encounter: Secondary | ICD-10-CM

## 2016-12-14 DIAGNOSIS — E119 Type 2 diabetes mellitus without complications: Secondary | ICD-10-CM | POA: Diagnosis not present

## 2016-12-14 DIAGNOSIS — Z79899 Other long term (current) drug therapy: Secondary | ICD-10-CM | POA: Insufficient documentation

## 2016-12-14 DIAGNOSIS — H18822 Corneal disorder due to contact lens, left eye: Secondary | ICD-10-CM | POA: Insufficient documentation

## 2016-12-14 MED ORDER — FLUORESCEIN SODIUM 0.6 MG OP STRP
1.0000 | ORAL_STRIP | Freq: Once | OPHTHALMIC | Status: AC
  Administered 2016-12-14: 1 via OPHTHALMIC
  Filled 2016-12-14: qty 1

## 2016-12-14 MED ORDER — TETRACAINE HCL 0.5 % OP SOLN
2.0000 [drp] | Freq: Once | OPHTHALMIC | Status: AC
  Administered 2016-12-14: 2 [drp] via OPHTHALMIC
  Filled 2016-12-14: qty 4

## 2016-12-14 MED ORDER — HYDROCODONE-ACETAMINOPHEN 5-325 MG PO TABS
1.0000 | ORAL_TABLET | Freq: Once | ORAL | Status: AC
  Administered 2016-12-14: 1 via ORAL
  Filled 2016-12-14: qty 1

## 2016-12-14 MED ORDER — CIPROFLOXACIN HCL 0.3 % OP SOLN
2.0000 [drp] | OPHTHALMIC | Status: DC
  Administered 2016-12-14: 2 [drp] via OPHTHALMIC
  Filled 2016-12-14: qty 2.5

## 2016-12-14 NOTE — ED Provider Notes (Addendum)
[facility] Provider Note: Angela Spurling, MD, FACEP  CSN: [id number] MRN: [id number] ARRIVAL: 12/14/16 at [facility]: [place]  Angela Solomon is a 40 y.o. female who has had the gradual onset of pain in her left eye after removing her contact lenses about 7:30 PM yesterday evening. She initially felt like there was a film across her field of vision and the pain has gradually worsened to the point of becoming severe. There is increased tearing. She has tried warm and cold compresses without adequate relief.   Past Medical History:  Diagnosis Date  . Anemia   . Depression   . Diabetes mellitus without complication (HCC)    dx at age 1  type 64  . GERD (gastroesophageal reflux disease)   . Headache   . Hypertension   . Multiple sclerosis ([place])   . Neuromuscular disorder ([place])    dx 2003 with MS  . Neuropathy ([place])   . PONV (postoperative nausea and vomiting)    sometimes...........  Marland Kitchen Vision abnormalities     Past Surgical History:  Procedure Laterality Date  . CHOLECYSTECTOMY    . DENTAL SURGERY    . LUMBAR LAMINECTOMY WITH COFLEX 1 LEVEL Bilateral 06/29/2015   Procedure: LUMBAR LAMINECTOMY WITH COFLEX 1 LEVEL;  Surgeon: Karie Chimera, MD;  Location: [facility];  Service: Neurosurgery;  Laterality: Bilateral;  LUMBAR LAMINECTOMY WITH COFLEX 1 LEVEL  . right foot surgery     remove a piece of glass  (had c/o for about 1 yr)  . TONSILLECTOMY    . TUBAL LIGATION    . WOUND EXPLORATION N/A 07/22/2015   Procedure: WOUND EXPLORATION;  Surgeon: Karie Chimera, MD;  Location: [facility];  Service: Neurosurgery;  Laterality: N/A;    Family History  Problem Relation Age of Onset  . Hypertension Mother   . Diabetes Mother   . Stroke Mother   . Heart attack Mother   . Peripheral vascular disease Mother     Social History  Substance Use Topics  . Smoking status: Current Every Day Smoker    Packs/day:  1.00    Years: 24.00    Types: Cigarettes  . Smokeless tobacco: Never Used  . Alcohol use 0.0 oz/week     Comment: occasional    Prior to Admission medications   Medication Sig Start Date End Date Taking? Authorizing Provider  acetaminophen (TYLENOL) 500 MG tablet Take 1,000 mg by mouth every 6 (six) hours as needed for mild pain or moderate pain.    Historical Provider, MD  albuterol (PROVENTIL HFA;VENTOLIN HFA) 108 (90 BASE) MCG/ACT inhaler Inhale 1 puff into the lungs as needed for wheezing or shortness of breath. Reported on 04/06/2016    Historical Provider, MD  amitriptyline (ELAVIL) 75 MG tablet Take 1 tablet (75 mg total) by mouth at bedtime. 08/29/16   Britt Bottom, MD  amLODipine (NORVASC) 5 MG tablet Take 5 mg by mouth. 03/17/16 03/17/17  Historical Provider, MD  B-D INS SYR ULTRAFINE 1CC/31G 31G X 5/16" 1 ML MISC See admin instructions. 10/01/14   Historical Provider, MD  carvedilol (COREG) 25 MG tablet Take 25 mg by mouth 2 (two) times daily.     Historical Provider, MD  cyclobenzaprine (FLEXERIL) 10 MG tablet Take 1 tablet (10 mg total) by mouth 3 (three) times daily as needed for muscle spasms. 06/28/16   Marcial Pacas, MD  diazepam (VALIUM) 10  MG tablet Take 1 tablet (10 mg total) by mouth at bedtime as needed for anxiety. 05/03/16   Britt Bottom, MD  DULoxetine (CYMBALTA) 60 MG capsule Take 1 capsule (60 mg total) by mouth daily. 02/11/16   Britt Bottom, MD  ferrous sulfate 325 (65 FE) MG tablet Take 325 mg by mouth daily.     Historical Provider, MD  fluconazole (DIFLUCAN) 150 MG tablet TK 1 T PO  NOW AND IF NOT BETTER REPEAT IN 3 DAYS 09/08/15   Historical Provider, MD  fluticasone (FLONASE) 50 MCG/ACT nasal spray Place 2 sprays into both nostrils as needed for allergies.     Historical Provider, MD  gabapentin (NEURONTIN) 400 MG capsule Take 2 capsules (800 mg total) by mouth 4 (four) times daily. 12/07/15   Britt Bottom, MD  HYDROcodone-acetaminophen (NORCO) 10-325 MG tablet  Take 1 tablet by mouth at bedtime as needed for moderate pain. 12/12/16   Britt Bottom, MD  insulin glargine (LANTUS) 100 UNIT/ML injection Inject into the skin at bedtime.    Historical Provider, MD  interferon beta-1a (AVONEX) 30 MCG injection Inject 30 mcg into the muscle every 7 (seven) days. 08/10/16   Britt Bottom, MD  losartan-hydrochlorothiazide (HYZAAR) 100-25 MG per tablet Take 1 tablet by mouth daily.     Historical Provider, MD  pantoprazole (PROTONIX) 40 MG tablet Take 40 mg by mouth at bedtime.     Historical Provider, MD  promethazine (PHENERGAN) 25 MG tablet Take 1 tablet (25 mg total) by mouth every 6 (six) hours as needed for nausea or vomiting. 02/22/16   Britt Bottom, MD    Allergies Atorvastatin; Glatiramer; Lisinopril; Niacin; Valproic acid; Buprenorphine hcl; Morphine and related; Aspirin; Hydrocodone-acetaminophen; and Zithromax [azithromycin]   REVIEW OF SYSTEMS  Negative except as noted here or in the History of Present Illness.   PHYSICAL EXAMINATION  Initial Vital Signs Blood pressure 144/93, pulse 108, temperature 97.9 F (36.6 C), temperature source Oral, resp. rate 18, SpO2 100 %.  Examination General: Well-developed, well-nourished female in no acute distress; appearance consistent with age of record HENT: normocephalic; atraumatic Eyes: pupils equal, round and reactive to light; extraocular muscles intact; increased tearing of left eye; mild left conjunctival injection; mild fluorescein uptake of left cornea; no hyphema; no hypopyon Neck: supple Heart: regular rate and rhythm; no murmurs, rubs or gallops Lungs: clear to auscultation bilaterally Abdomen: soft; nondistended; nontender; no masses or hepatosplenomegaly; bowel sounds present Extremities: No deformity; full range of motion; pulses normal Neurologic: Awake, alert and oriented; motor function intact in all extremities and symmetric; no facial droop Skin: Warm and dry Psychiatric: Normal  mood and affect   RESULTS  Summary of this visit's results, reviewed by myself:   EKG Interpretation  Date/Time:    Ventricular Rate:    PR Interval:    QRS Duration:   QT Interval:    QTC Calculation:   R Axis:     Text Interpretation:        Laboratory Studies: No results found for this or any previous visit (from the past 24 hour(s)). Imaging Studies: No results found.  ED COURSE  Nursing notes and initial vitals signs, including pulse oximetry, reviewed.  Vitals:   12/14/16 0258  BP: 144/93  Pulse: 108  Resp: 18  Temp: 97.9 F (36.6 C)  TempSrc: Oral  SpO2: 100%   The patient has an optometrist with whom she can follow-up. She states she is able to tolerate hydrocodone and  has hydrocodone at home.  PROCEDURES    ED DIAGNOSES     ICD-9-CM ICD-10-CM   1. Abrasion of left cornea, initial encounter 918.1 S05.02XA        Shanon Rosser, MD 12/14/16 [place], MD 12/14/16 [phone]

## 2016-12-14 NOTE — ED Notes (Signed)
C/o left eye pain after removing contacts around 1930 last pm

## 2016-12-14 NOTE — ED Triage Notes (Signed)
Pt reports left eye pain after removing contact lens.

## 2016-12-23 IMAGING — RF DG C-ARM 61-120 MIN
1 series · 2 of 2 positions shown · non-contrast
Comparison: MRI of the lumbar spine from an outside facility dated

CLINICAL DATA: Intraoperative fluoro spot films from a lumbar let
laminectomy

EXAM:
LUMBAR SPINE - 2-3 VIEW; DG C-ARM 61-120 MIN

[Series 1: run · 2 of 2 slices shown]
[im 1/2]
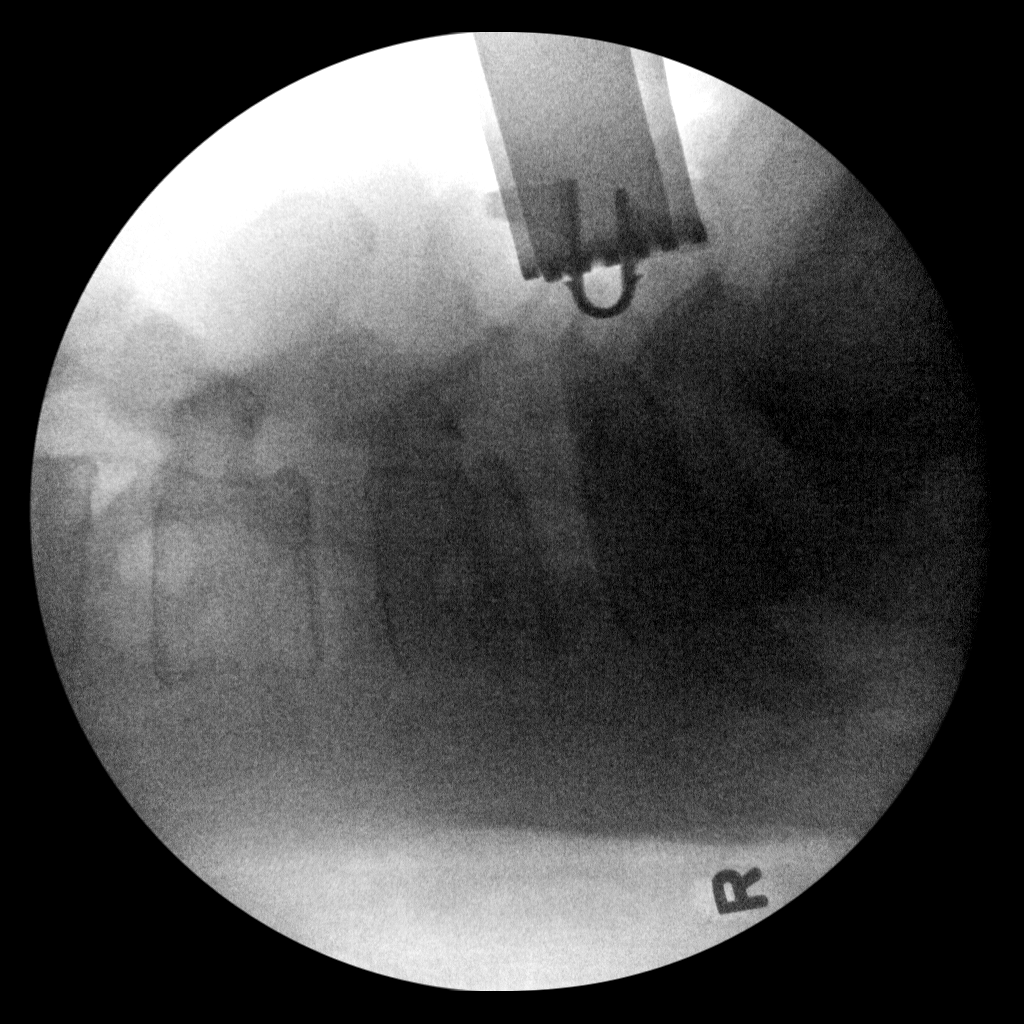
[im 2/2]
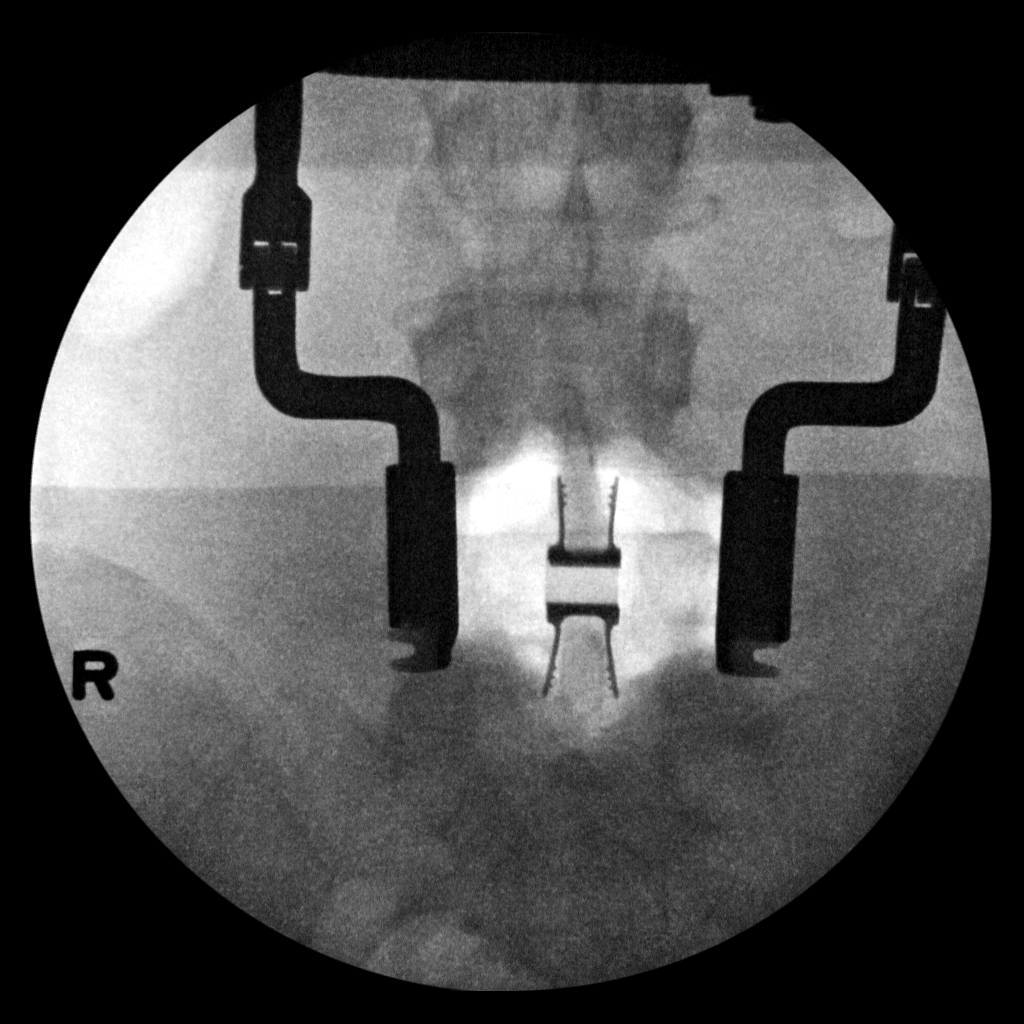

[2 of 2 positions shown; findings below may reference images not displayed]

FINDINGS: Fluoro time reported is 14 seconds. Two spot films are reviewed. The
lateral spot film timed at [DATE] a.m. on [REDACTED] reveals a
tissuesspreader device overlying the spinous process of L5. The AP
view timed at [DATE] a.m. reveals the tissues [place] device as well
as a radiodense structure projecting at the L5 level.
IMPRESSION: Intraoperative fluoro spot films from a lower lumbar laminectomy.
Further interpretation is deferred to Dr. Menjivar.

## 2017-01-03 ENCOUNTER — Telehealth: Payer: Self-pay | Admitting: *Deleted

## 2017-01-03 NOTE — Telephone Encounter (Signed)
LMOM that her handicap placard application will be ready for her to pick up today--will be at the front desk/fim

## 2017-01-16 ENCOUNTER — Telehealth: Payer: Self-pay | Admitting: Neurology

## 2017-01-16 MED ORDER — HYDROCODONE-ACETAMINOPHEN 10-325 MG PO TABS: 1.0000 | ORAL_TABLET | Freq: Every evening | ORAL | 0 refills | Status: DC | PRN

## 2017-01-16 NOTE — Telephone Encounter (Signed)
Rx. awaiting [name] sig/fim 

## 2017-01-16 NOTE — Telephone Encounter (Signed)
Patient called requesting refill for HYDROcodone-acetaminophen (NORCO) 10-325 MG tablet.

## 2017-01-16 NOTE — Telephone Encounter (Signed)
Hydrocodone rx. up front [facility]/fim 

## 2017-01-16 NOTE — Addendum Note (Signed)
Addended by: France Ravens I on: 01/16/2017 03:28 PM   Modules accepted: Orders

## 2017-01-18 ENCOUNTER — Emergency Department (HOSPITAL_BASED_OUTPATIENT_CLINIC_OR_DEPARTMENT_OTHER)
Admission: EM | Admit: 2017-01-18 | Discharge: 2017-01-18 | Disposition: A | Discharge status: Home / Self Care | Payer: Medicare Other | Attending: Emergency Medicine | Admitting: Emergency Medicine

## 2017-01-18 ENCOUNTER — Encounter (HOSPITAL_BASED_OUTPATIENT_CLINIC_OR_DEPARTMENT_OTHER): Payer: Self-pay | Admitting: *Deleted

## 2017-01-18 DIAGNOSIS — I10 Essential (primary) hypertension: Secondary | ICD-10-CM | POA: Diagnosis not present

## 2017-01-18 DIAGNOSIS — E119 Type 2 diabetes mellitus without complications: Secondary | ICD-10-CM | POA: Insufficient documentation

## 2017-01-18 DIAGNOSIS — H5712 Ocular pain, left eye: Secondary | ICD-10-CM | POA: Diagnosis present

## 2017-01-18 DIAGNOSIS — H16012 Central corneal ulcer, left eye: Secondary | ICD-10-CM | POA: Diagnosis not present

## 2017-01-18 DIAGNOSIS — Z79899 Other long term (current) drug therapy: Secondary | ICD-10-CM | POA: Insufficient documentation

## 2017-01-18 DIAGNOSIS — H16002 Unspecified corneal ulcer, left eye: Secondary | ICD-10-CM

## 2017-01-18 DIAGNOSIS — Z794 Long term (current) use of insulin: Secondary | ICD-10-CM | POA: Diagnosis not present

## 2017-01-18 DIAGNOSIS — F17210 Nicotine dependence, cigarettes, uncomplicated: Secondary | ICD-10-CM | POA: Diagnosis not present

## 2017-01-18 LAB — CBG MONITORING, ED: Glucose-Capillary: 265 mg/dL — ABNORMAL HIGH (ref 65–99)

## 2017-01-18 MED ORDER — HYDROCODONE-ACETAMINOPHEN 5-325 MG PO TABS
1.0000 | ORAL_TABLET | Freq: Once | ORAL | Status: AC
  Administered 2017-01-18: 1 via ORAL
  Filled 2017-01-18: qty 1

## 2017-01-18 MED ORDER — IBUPROFEN 800 MG PO TABS
800.0000 mg | ORAL_TABLET | Freq: Once | ORAL | Status: AC
  Administered 2017-01-18: 800 mg via ORAL
  Filled 2017-01-18: qty 1

## 2017-01-18 MED ORDER — CIPROFLOXACIN HCL 0.3 % OP SOLN: 2.0000 [drp] | OPHTHALMIC | 0 refills | Status: DC

## 2017-01-18 MED ORDER — TETRACAINE HCL 0.5 % OP SOLN
1.0000 [drp] | Freq: Once | OPHTHALMIC | Status: AC
  Administered 2017-01-18: 1 [drp] via OPHTHALMIC
  Filled 2017-01-18: qty 4

## 2017-01-18 MED ORDER — HYDROCODONE-ACETAMINOPHEN 5-325 MG PO TABS: 1.0000 | ORAL_TABLET | Freq: Four times a day (QID) | ORAL | 0 refills | Status: DC | PRN

## 2017-01-18 MED ORDER — FLUORESCEIN SODIUM 0.6 MG OP STRP
2.0000 | ORAL_STRIP | Freq: Once | OPHTHALMIC | Status: AC
  Administered 2017-01-18: 2 via OPHTHALMIC
  Filled 2017-01-18 (×2): qty 2

## 2017-01-18 MED FILL — HYDROCODON-APAP 5-325: 5-325 | 2 days supply | Qty: 7 | Fill #0

## 2017-01-18 MED FILL — CIPROFLOXACIN 0.3% EYE DROP: 0.3 | 12 days supply | Qty: 5 | Fill #0

## 2017-01-18 NOTE — Discharge Instructions (Signed)
Discontinue use of your contact lenses. Use the ciprofloxacin drops as prescribed. You may take ibuprofen or Norco, as prescribed, for severe pain. Call an ophthalmologist today to schedule a follow up appointment to ensure that symptoms resolve. Return for new or concerning symptoms.

## 2017-01-18 NOTE — ED Triage Notes (Signed)
Pain in both eyes. She fell asleep with her contacts in her eyes.

## 2017-01-18 NOTE — ED Notes (Signed)
Attempted visual acuity at triage but pt was unable to tolerate opening her eyes.

## 2017-01-18 NOTE — ED Notes (Signed)
Pt states she feels like her blood sugar is dropping. Accu check done.

## 2017-01-18 NOTE — ED Provider Notes (Signed)
[facility] Provider Note   CSN: [id number] Arrival date & time: 01/18/17  1216    History   Chief Complaint Chief Complaint  Patient presents with  . Eye Problem    HPI Angela Solomon is a 41 y.o. female.  The history is provided by the patient. No language interpreter was used.  Eye Problem   This is a new problem. The current episode started 12 to 24 hours ago. The problem occurs constantly. The problem has not changed since onset.There is a problem in both eyes. The injury mechanism was contact lenses. The pain is moderate. There is no history of trauma to the eye. There is no known exposure to pink eye. She wears contacts. Associated symptoms include blurred vision, discharge, foreign body sensation ("feels like sand is in both of my eyes") and photophobia. Pertinent negatives include no nausea and no vomiting. She has tried nothing for the symptoms.    Past Medical History:  Diagnosis Date  . Anemia   . Depression   . Diabetes mellitus without complication (HCC)    dx at age 3  type 61  . GERD (gastroesophageal reflux disease)   . Headache   . Hypertension   . Multiple sclerosis ([place])   . Neuromuscular disorder ([place])    dx 2003 with MS  . Neuropathy ([place])   . PONV (postoperative nausea and vomiting)    sometimes...........  Marland Kitchen Vision abnormalities     Patient Active Problem List   Diagnosis Date Noted  . Abscess of foot 04/06/2016  . Infection due to Streptococcus agalactiae 04/06/2016  . Ulnar nerve entrapment at elbow 03/20/2016  . Encounter for surgical follow-up care 03/17/2016  . At risk for falling 03/06/2016  . Absence of toe ([place]) 02/27/2016  . Acquired absence of left great toe ([place]) 02/27/2016  . 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 Sepsis affecting skin ([place]) 02/24/2016  . Systemic infection ([place]) 02/24/2016  . Abscess or cellulitis of toe 02/23/2016  . Cellulitis of foot 02/23/2016  . Chronic pain associated with  significant psychosocial dysfunction 02/23/2016  . Nicotine addiction 02/23/2016  . Febrile 02/23/2016  . Ulcer of foot ([place]) 02/23/2016  . Other long term (current) drug therapy 02/20/2016  . Cannot sleep 02/20/2016  . Lipoma of skin and subcutaneous tissue of trunk 02/20/2016  . Long term current use of insulin ([place]) 02/20/2016  . Pain in left wrist 02/19/2016  . Anxiety 02/15/2016  . Ataxic gait 02/15/2016  . Breast lump 02/15/2016  . Cognitive decline 02/15/2016  . Carpal tunnel syndrome 02/15/2016  . Clinical depression 02/15/2016  . Gastro-esophageal reflux disease without esophagitis 02/15/2016  . Big thyroid 02/15/2016  . Microcytic anemia 02/15/2016  . Headache, migraine 02/15/2016  . Type 1 diabetes mellitus ([place]) 02/15/2016  . Essential (primary) hypertension 02/11/2016  . Diffuse goiter 02/11/2016  . Combined fat and carbohydrate induced hyperlipemia 02/11/2016  . Adult BMI 30+ 02/11/2016  . 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 Type 2 diabetes mellitus ([place]) 02/11/2016  . Gait disturbance 02/11/2016  . Neck pain 02/11/2016  . 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 Diabetes mellitus ([place]) 12/07/2015  . Knee pain, left 08/17/2015  . Shoulder pain, left 08/17/2015  . Ulnar neuropathy of left upper extremity 08/17/2015  . Diabetes, polyneuropathy ([place]) 08/03/2015  . Dysesthesia 08/03/2015  . Urinary urgency 08/03/2015  . Wound drainage 07/21/2015  . Spinal stenosis, lumbar  06/29/2015  . Lumbar radicular pain 03/30/2015  . Chronic lower back pain 03/18/2015  . Diabetes ([place]) 01/07/2015  . Multiple sclerosis ([place]) 01/07/2015  . Chronic fatigue 01/07/2015  . Major depressive disorder, recurrent episode, moderate ([place]) 01/07/2015  . CFIDS (chronic fatigue and immune dysfunction syndrome) ([place]) 01/07/2015  . Moderate episode of recurrent major depressive disorder ([place]) 01/07/2015    Past  Surgical History:  Procedure Laterality Date  . CHOLECYSTECTOMY    . DENTAL SURGERY    . LUMBAR LAMINECTOMY WITH COFLEX 1 LEVEL Bilateral 06/29/2015   Procedure: LUMBAR LAMINECTOMY WITH COFLEX 1 LEVEL;  Surgeon: Karie Chimera, MD;  Location: [facility];  Service: Neurosurgery;  Laterality: Bilateral;  LUMBAR LAMINECTOMY WITH COFLEX 1 LEVEL  . right foot surgery     remove a piece of glass  (had c/o for about 1 yr)  . TONSILLECTOMY    . TUBAL LIGATION    . WOUND EXPLORATION N/A 07/22/2015   Procedure: WOUND EXPLORATION;  Surgeon: Karie Chimera, MD;  Location: [facility];  Service: Neurosurgery;  Laterality: N/A;    OB History    No data available       Home Medications    Prior to Admission medications   Medication Sig Start Date End Date Taking? Authorizing Provider  acetaminophen (TYLENOL) 500 MG tablet Take 1,000 mg by mouth every 6 (six) hours as needed for mild pain or moderate pain.    Historical Provider, MD  albuterol (PROVENTIL HFA;VENTOLIN HFA) 108 (90 BASE) MCG/ACT inhaler Inhale 1 puff into the lungs as needed for wheezing or shortness of breath. Reported on 04/06/2016    Historical Provider, MD  amitriptyline (ELAVIL) 75 MG tablet Take 1 tablet (75 mg total) by mouth at bedtime. 08/29/16   Britt Bottom, MD  amLODipine (NORVASC) 5 MG tablet Take 5 mg by mouth. 03/17/16 03/17/17  Historical Provider, MD  B-D INS SYR ULTRAFINE 1CC/31G 31G X 5/16" 1 ML MISC See admin instructions. 10/01/14   Historical Provider, MD  carvedilol (COREG) 25 MG tablet Take 25 mg by mouth 2 (two) times daily.     Historical Provider, MD  ciprofloxacin (CILOXAN) 0.3 % ophthalmic solution Place 2 drops into both eyes every 2 (two) hours. Apply 2 drops every 88min for 6 hours, then 2 drops ever 64min for remainder of day 1, then 2 drops ever 4 hours on days 2-12. 01/18/17   Antonietta Breach, PA-C  cyclobenzaprine (FLEXERIL) 10 MG tablet Take 1 tablet (10 mg total) by mouth 3 (three) times daily as needed for  muscle spasms. 06/28/16   Marcial Pacas, MD  diazepam (VALIUM) 10 MG tablet Take 1 tablet (10 mg total) by mouth at bedtime as needed for anxiety. 05/03/16   Britt Bottom, MD  DULoxetine (CYMBALTA) 60 MG capsule Take 1 capsule (60 mg total) by mouth daily. 02/11/16   Britt Bottom, MD  ferrous sulfate 325 (65 FE) MG tablet Take 325 mg by mouth daily.     Historical Provider, MD  fluconazole (DIFLUCAN) 150 MG tablet TK 1 T PO  NOW AND IF NOT BETTER REPEAT IN 3 DAYS 09/08/15   Historical Provider, MD  fluticasone (FLONASE) 50 MCG/ACT nasal spray Place 2 sprays into both nostrils as needed for allergies.     Historical Provider, MD  gabapentin (NEURONTIN) 400 MG capsule Take 2 capsules (800 mg total) by mouth 4 (four) times daily. 12/07/15   Britt Bottom, MD  HYDROcodone-acetaminophen (NORCO/VICODIN) 5-325 MG tablet Take 1-2  tablets by mouth every 6 (six) hours as needed for severe pain. 01/18/17   Antonietta Breach, PA-C  insulin glargine (LANTUS) 100 UNIT/ML injection Inject into the skin at bedtime.    Historical Provider, MD  interferon beta-1a (AVONEX) 30 MCG injection Inject 30 mcg into the muscle every 7 (seven) days. 08/10/16   Britt Bottom, MD  losartan-hydrochlorothiazide (HYZAAR) 100-25 MG per tablet Take 1 tablet by mouth daily.     Historical Provider, MD  pantoprazole (PROTONIX) 40 MG tablet Take 40 mg by mouth at bedtime.     Historical Provider, MD  promethazine (PHENERGAN) 25 MG tablet Take 1 tablet (25 mg total) by mouth every 6 (six) hours as needed for nausea or vomiting. 02/22/16   Britt Bottom, MD    Family History Family History  Problem Relation Age of Onset  . Hypertension Mother   . Diabetes Mother   . Stroke Mother   . Heart attack Mother   . Peripheral vascular disease Mother     Social History Social History  Substance Use Topics  . Smoking status: Current Every Day Smoker    Packs/day: 1.00    Years: 24.00    Types: Cigarettes  . Smokeless tobacco: Never Used    . Alcohol use 0.0 oz/week     Comment: occasional     Allergies   Atorvastatin; Glatiramer; Lisinopril; Niacin; Valproic acid; Buprenorphine hcl; Morphine and related; Aspirin; Hydrocodone-acetaminophen; and Zithromax [azithromycin]   Review of Systems Review of Systems  Eyes: Positive for blurred vision, photophobia and discharge.  Gastrointestinal: Negative for nausea and vomiting.   Ten systems reviewed and are negative for acute change, except as noted in the HPI.    Physical Exam Updated Vital Signs BP 177/82   Pulse 92   Temp 98 F (36.7 C) (Oral)   Resp 20   Ht 6\' 1"  (1.854 m)   Wt 118.8 kg   LMP 01/11/2017   SpO2 100%   BMI 34.57 kg/m   Physical Exam  Constitutional: She is oriented to person, place, and time. She appears well-developed and well-nourished. No distress.  Patient tearful, but in NAD  HENT:  Head: Normocephalic and atraumatic.  Eyes: EOM are normal. Pupils are equal, round, and reactive to light. Right eye exhibits discharge (clear, tearing). Left eye exhibits discharge (clear, tearing). No scleral icterus.  Conjunctival injection and clear tearing bilaterally. No purulence. No proptosis or hematoma. No subconjuntival hemorrhage. +Photophobia. Circular defect to the central cornea of the left eye c/w corneal ulcer. No dendritic staining. No corneal abrasion. No abnormalities noted on staining of the right eye.  Neck: Normal range of motion.  Pulmonary/Chest: Effort normal. No respiratory distress.  Musculoskeletal: Normal range of motion.  Neurological: She is alert and oriented to person, place, and time. She exhibits normal muscle tone. Coordination normal.  Skin: Skin is warm and dry. No rash noted. She is not diaphoretic. No erythema. No pallor.  Psychiatric: She has a normal mood and affect. Her behavior is normal.  Nursing note and vitals reviewed.    ED Treatments / Results  Labs (all labs ordered are listed, but only abnormal results  are displayed) Labs Reviewed  CBG MONITORING, ED - Abnormal; Notable for the following:       Result Value   Glucose-Capillary 265 (*)    All other components within normal limits    EKG  EKG Interpretation None       Radiology No results found.  Procedures Procedures (  including critical care time)  Medications Ordered in ED Medications  tetracaine (PONTOCAINE) 0.5 % ophthalmic solution 1 drop (1 drop Both Eyes Given 01/18/17 1408)  fluorescein ophthalmic strip 2 strip (2 strips Both Eyes Given 01/18/17 1408)  ibuprofen (ADVIL,MOTRIN) tablet 800 mg (800 mg Oral Given 01/18/17 1407)  HYDROcodone-acetaminophen (NORCO/VICODIN) 5-325 MG per tablet 1 tablet (1 tablet Oral Given 01/18/17 1453)     Initial Impression / Assessment and Plan / ED Course  I have reviewed the triage vital signs and the nursing notes.  Pertinent labs & imaging results that were available during my care of the patient were reviewed by me and considered in my medical decision making (see chart for details).     Patient presents to the emergency department for evaluation of bilateral eye pain after wearing her contacts overnight. Patient with clear tearing to bilateral eyes. Conjunctival injection noted. There is evidence of corneal ulcer to the left eye. No ulcer or abrasion to the right eye on staining, though these symptoms are also consistent with keratitis. Plan to manage with ciprofloxacin drops. Patient given referral to ophthalmology. She has been instructed to call the office today for follow-up. Return precautions discussed and provided. Patient discharged in satisfactory condition with no unaddressed concerns.   Final Clinical Impressions(s) / ED Diagnoses   Final diagnoses:  Corneal ulcer, left    New Prescriptions Discharge Medication List as of 01/18/2017  2:45 PM    START taking these medications   Details  ciprofloxacin (CILOXAN) 0.3 % ophthalmic solution Place 2 drops into both eyes  every 2 (two) hours. Apply 2 drops every 13min for 6 hours, then 2 drops ever 64min for remainder of day 1, then 2 drops ever 4 hours on days 2-12., Starting Thu 01/18/2017, Print    HYDROcodone-acetaminophen (NORCO/VICODIN) 5-325 MG tablet Take 1-2 tablets by mouth every 6 (six) hours as needed for severe pain., Starting Thu 01/18/2017, Print         [place], PA-C 01/18/17 [place], MD 01/18/17 [phone]

## 2017-03-19 DIAGNOSIS — M25475 Effusion, left foot: Secondary | ICD-10-CM | POA: Insufficient documentation

## 2017-03-20 DIAGNOSIS — Z89419 Acquired absence of unspecified great toe: Secondary | ICD-10-CM | POA: Insufficient documentation

## 2017-04-17 ENCOUNTER — Telehealth: Payer: Self-pay | Admitting: Neurology

## 2017-04-17 MED ORDER — HYDROCODONE-ACETAMINOPHEN 5-325 MG PO TABS: 1.0000 | ORAL_TABLET | Freq: Four times a day (QID) | ORAL | 0 refills | Status: DC | PRN

## 2017-04-17 NOTE — Telephone Encounter (Signed)
Pt called in for a refill of  HYDROcodone-acetaminophen (NORCO/VICODIN) 5-325 MG tablet  &  cyclobenzaprine (FLEXERIL) 10 MG tablet pt wants this one called into  [facility], [place] - [address] [phone] (Phone) [phone] (Fax)

## 2017-04-17 NOTE — Telephone Encounter (Signed)
Rx. awaiting YY sig, as [name] is ooo this wk/fim

## 2017-04-17 NOTE — Addendum Note (Signed)
Addended by: France Ravens I on: 04/17/2017 02:08 PM   Modules accepted: Orders

## 2017-04-17 NOTE — Telephone Encounter (Signed)
Rx. up front [facility]/fim 

## 2017-04-24 DIAGNOSIS — E6609 Other obesity due to excess calories: Secondary | ICD-10-CM | POA: Insufficient documentation

## 2017-05-16 ENCOUNTER — Other Ambulatory Visit: Payer: Self-pay | Admitting: Neurology

## 2017-05-16 ENCOUNTER — Other Ambulatory Visit: Payer: Self-pay | Admitting: *Deleted

## 2017-05-16 MED ORDER — HYDROCODONE-ACETAMINOPHEN 5-325 MG PO TABS: 1.0000 | ORAL_TABLET | Freq: Four times a day (QID) | ORAL | 0 refills | Status: DC | PRN

## 2017-05-17 ENCOUNTER — Encounter: Payer: Self-pay | Admitting: Neurology

## 2017-05-29 ENCOUNTER — Telehealth: Payer: Self-pay | Admitting: Neurology

## 2017-05-29 NOTE — Telephone Encounter (Signed)
Pt calling for refill of HYDROcodone-acetaminophen (NORCO/VICODIN) 5-325 MG tablet

## 2017-05-29 NOTE — Telephone Encounter (Signed)
I have spoken with Angeliki this afternoon--Hydrocodone was just r/f 5/23.  She has not picked up this rx. but will do so./fim

## 2017-06-12 ENCOUNTER — Ambulatory Visit: Payer: Medicaid Other | Admitting: Neurology

## 2017-07-02 ENCOUNTER — Telehealth: Payer: Self-pay | Admitting: Neurology

## 2017-07-02 NOTE — Telephone Encounter (Signed)
Patient is calling to discuss appointment she has with Dr. Felecia Shelling on 07-04-17.

## 2017-07-02 NOTE — Telephone Encounter (Signed)
I have spoken with Angela Solomon this afternoon.  She sts. there has been a mixup with her Medicaid and she is now only listed as having family planning Medicaid.  She has an appt. with [name] and is c/o new/worsening sx. I have advised [name] will see her anyway.  I can't guarantee she won't be responsible for some part of the bill, if Medicaid is incorrect or inactive, but [name] will still see her.  She verbalized understanding of same/fim

## 2017-07-04 ENCOUNTER — Encounter: Payer: Self-pay | Admitting: Neurology

## 2017-07-04 ENCOUNTER — Ambulatory Visit (INDEPENDENT_AMBULATORY_CARE_PROVIDER_SITE_OTHER): Payer: Medicare Other | Admitting: Neurology

## 2017-07-04 VITALS — BP 145/90 | HR 96 | Resp 20 | Ht 73.0 in | Wt 261.0 lb

## 2017-07-04 DIAGNOSIS — G894 Chronic pain syndrome: Secondary | ICD-10-CM | POA: Diagnosis not present

## 2017-07-04 DIAGNOSIS — R5382 Chronic fatigue, unspecified: Secondary | ICD-10-CM

## 2017-07-04 DIAGNOSIS — G35 Multiple sclerosis: Secondary | ICD-10-CM

## 2017-07-04 DIAGNOSIS — R208 Other disturbances of skin sensation: Secondary | ICD-10-CM

## 2017-07-04 DIAGNOSIS — E1142 Type 2 diabetes mellitus with diabetic polyneuropathy: Secondary | ICD-10-CM | POA: Diagnosis not present

## 2017-07-04 DIAGNOSIS — G4700 Insomnia, unspecified: Secondary | ICD-10-CM | POA: Diagnosis not present

## 2017-07-04 DIAGNOSIS — R3915 Urgency of urination: Secondary | ICD-10-CM

## 2017-07-04 MED ORDER — TEMAZEPAM 15 MG PO CAPS: 15.0000 mg | ORAL_CAPSULE | Freq: Every evening | ORAL | 5 refills | Status: DC | PRN

## 2017-07-04 MED ORDER — HYDROCODONE-ACETAMINOPHEN 10-325 MG PO TABS: ORAL_TABLET | ORAL | 0 refills | Status: DC

## 2017-07-04 NOTE — Progress Notes (Signed)
[facility]  PATIENT: Angela Solomon DOB: December 26, 1975  REFERRING CLINICIAN: Dr. Rita Ohara  HISTORY FROM: patient REASON FOR VISIT: MS   HISTORICAL  CHIEF COMPLAINT:  Chief Complaint  Patient presents with  . Multiple Sclerosis    Sts. has missed last 2 Avonex inj. due to ins. mixup.  Will call [facility] for emergency supply of med/fim    HISTORY OF PRESENT ILLNESS:   Angela Solomon is a 41 year old woman with relapsing remitting multiple sclerosis. .     MS:   She has been stable on Avonex as she tolerates it well. She has stopped and started the medication several times due to difficulties with insurance and ran out a couple months ago. We are working with her to try to get this reinstated again.  Gait/strength:   Feels her gait is poor able to walk without a walker now. Gait worsened after her back surgery and she continues to report some weakness. 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   She has diabetes and has a superimposed diabetic polyneuropathy with her radicular symptoms.   She notes some benefit with amitriptyline and gabapentin.     Bladder:   She notes some urinary frequency and urgency that has not had incontinence.  Vision: She denies any MS related vision problems.    Mood/Cognition:      She has had depression and anxiety. Depression worsened around the time she dated her back operated on. She only feels she gets a little bit of benefit from medications.    She feels her short-term memory is poor at times but notes no major cognitive problems.  Fatigue/sleep:   She notes physical morbid cognitive fatigue. Sleep is poor. She is having difficulty both with sleep onset and sleep maintenance insomnia. She feels the amitriptyline and gabapentin have helped her a little bit..   Lower Back Pain: In July 2016 she had surgery for a  lumbar radiculopathy (Dr. Hal Neer).   She had a large disc herniation at L4-L5.     Pain improved after surgery but she notes some continued LBP and feels ROM is worse.    Neck Pain/shoulder pain:   She continues to have a lot of neck pain and left greater than right shoulder pain. She has seen orthopedics about the shoulder and has had injections.  ---------------------------------------  MS History:   She presented in 2003 with gait ataxia, slurred speech and right sided paresthesias. Imaging studies were performed and she was diagnosed with multiple sclerosis. Initially, she was placed on Rebif but had skin reactions and switched to Copaxone. On Copaxone, she had an immediate hypersensitivity response and it was discontinued. She has been on Avonex most of the time but has been off for months at a time when she has had insurance difficulties.      Review of MRI's: 1. MRI of the cervical spine dated 05/20/2014. It showed enhancing spinal cord lesions consistent with MS. C2-C3 there was a left lateral focus. Also at C2-C3 there was a posterior focus. At C3-C4 there was a right posterior lateral focus. At C4 there was a dorsal focus and at C5 there was another dorsal focus. 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 There are multilevel degenerative changes most pronounced at C7-T1 where there could be dynamic impingement of the right C8  nerve root.  2. MRI of the brain dated 09/28/2013 showing diffuse white matter foci in the periventricular and deep white matter consistent with [place].  Although there were no enhancing foci, when compared to a previous MRI dated 10/11/2010, there was some progression.  3. An MRI of the lumbar spine dated 04/27/2015 showed a large disc extrusion with sequestered fragment causing bilateral L5 nerve root compression.   4. MRI of the brain with and without contrast dated 10/16/2008 showed enhancing lesions.  She was reportedly off medication at that time.   REVIEW OF  SYSTEMS:  Constitutional: No fevers, chills, sweats, or change in appetite.   Fatigue, poor sleep Eyes: No visual changes, double vision, eye pain Ear, nose and throat: No hearing loss, ear pain, nasal congestion, sore throat Cardiovascular: No chest pain, palpitations Respiratory:  No shortness of breath at rest or with exertion.   No wheezes GastrointestinaI: No nausea, vomiting, diarrhea, abdominal pain, fecal incontinence Genitourinary:  No dysuria, urinary retention or frequency.  No nocturia. Musculoskeletal:  as above. Integumentary: No rash, pruritus, skin lesions Neurological: as above Psychiatric: No depression at this time.  No anxiety Endocrine: No palpitations, diaphoresis, change in appetite, change in weigh or increased thirst Hematologic/Lymphatic:  No anemia, purpura, petechiae. Allergic/Immunologic: No itchy/runny eyes, nasal congestion, recent allergic reactions, rashes  ALLERGIES: Allergies  Allergen Reactions  . Atorvastatin Other (See Comments)    Myalgia Myalgia  . Glatiramer Anaphylaxis    Generic Copaxone  . Lisinopril Anaphylaxis  . Niacin Diarrhea  . Valproic Acid Anaphylaxis  . Buprenorphine Hcl Hives  . Morphine And Related Hives  . Aspirin Nausea And Vomiting    Fevers and GI upset  . Hydrocodone-Acetaminophen Nausea And Vomiting    Pt states can take Hydrocodone but has a reaction to a additive in vicodin  . Zithromax [Azithromycin] Rash    HOME MEDICATIONS: Outpatient Medications Prior to Visit  Medication Sig Dispense Refill  . acetaminophen (TYLENOL) 500 MG tablet Take 1,000 mg by mouth every 6 (six) hours as needed for mild pain or moderate pain.    Marland Kitchen albuterol (PROVENTIL HFA;VENTOLIN HFA) 108 (90 BASE) MCG/ACT inhaler Inhale 1 puff into the lungs as needed for wheezing or shortness of breath. Reported on 04/06/2016    . amitriptyline (ELAVIL) 75 MG tablet Take 1 tablet (75 mg total) by mouth at bedtime. 30 tablet 11  . B-D INS SYR ULTRAFINE  1CC/31G 31G X 5/16" 1 ML MISC See admin instructions.  0  . carvedilol (COREG) 25 MG tablet Take 25 mg by mouth 2 (two) times daily.     . cyclobenzaprine (FLEXERIL) 10 MG tablet Take 1 tablet (10 mg total) by mouth 3 (three) times daily as needed for muscle spasms. 30 tablet 1  . DULoxetine (CYMBALTA) 60 MG capsule Take 1 capsule (60 mg total) by mouth daily. 30 capsule 11  . ferrous sulfate 325 (65 FE) MG tablet Take 325 mg by mouth daily.     . fluconazole (DIFLUCAN) 150 MG tablet TK 1 T PO  NOW AND IF NOT BETTER REPEAT IN 3 DAYS  0  . fluticasone (FLONASE) 50 MCG/ACT nasal spray Place 2 sprays into both nostrils as needed for allergies.     Marland Kitchen gabapentin (NEURONTIN) 400 MG capsule Take 2 capsules (800 mg total) by mouth 4 (four) times daily. 240 capsule 11  . insulin glargine (LANTUS) 100 UNIT/ML injection Inject into the skin at bedtime.    . interferon beta-1a (AVONEX) 30 MCG injection  Inject 30 mcg into the muscle every 7 (seven) days. 12 each 3  . losartan-hydrochlorothiazide (HYZAAR) 100-25 MG per tablet Take 1 tablet by mouth daily.     . pantoprazole (PROTONIX) 40 MG tablet Take 40 mg by mouth at bedtime.     . promethazine (PHENERGAN) 25 MG tablet Take 1 tablet (25 mg total) by mouth every 6 (six) hours as needed for nausea or vomiting. 30 tablet 1  . ciprofloxacin (CILOXAN) 0.3 % ophthalmic solution Place 2 drops into both eyes every 2 (two) hours. Apply 2 drops every 65min for 6 hours, then 2 drops ever 58min for remainder of day 1, then 2 drops ever 4 hours on days 2-12. 5 mL 0  . diazepam (VALIUM) 10 MG tablet Take 1 tablet (10 mg total) by mouth at bedtime as needed for anxiety. 30 tablet 5  . HYDROcodone-acetaminophen (NORCO/VICODIN) 5-325 MG tablet Take 1-2 tablets by mouth every 6 (six) hours as needed for severe pain. 30 tablet 0  . amLODipine (NORVASC) 5 MG tablet Take 5 mg by mouth.     No facility-administered medications prior to visit.     PAST MEDICAL HISTORY: Past  Medical History:  Diagnosis Date  . Anemia   . Depression   . Diabetes mellitus without complication (HCC)    dx at age 68  type 2  . GERD (gastroesophageal reflux disease)   . Headache   . Hypertension   . Multiple sclerosis ([place])   . Neuromuscular disorder ([place])    dx 2003 with MS  . Neuropathy   . PONV (postoperative nausea and vomiting)    sometimes...........  Marland Kitchen Vision abnormalities     PAST SURGICAL HISTORY: Past Surgical History:  Procedure Laterality Date  . CHOLECYSTECTOMY    . DENTAL SURGERY    . LUMBAR LAMINECTOMY WITH COFLEX 1 LEVEL Bilateral 06/29/2015   Procedure: LUMBAR LAMINECTOMY WITH COFLEX 1 LEVEL;  Surgeon: Karie Chimera, MD;  Location: [facility];  Service: Neurosurgery;  Laterality: Bilateral;  LUMBAR LAMINECTOMY WITH COFLEX 1 LEVEL  . right foot surgery     remove a piece of glass  (had c/o for about 1 yr)  . TONSILLECTOMY    . TUBAL LIGATION    . WOUND EXPLORATION N/A 07/22/2015   Procedure: WOUND EXPLORATION;  Surgeon: Karie Chimera, MD;  Location: [facility];  Service: Neurosurgery;  Laterality: N/A;    FAMILY HISTORY: Family History  Problem Relation Age of Onset  . Hypertension Mother   . Diabetes Mother   . Stroke Mother   . Heart attack Mother   . Peripheral vascular disease Mother     SOCIAL HISTORY:  Social History   Social History  . Marital status: Married    Spouse name: N/A  . Number of children: N/A  . Years of education: N/A   Occupational History  . Not on file.   Social History Main Topics  . Smoking status: Current Every Day Smoker    Packs/day: 1.00    Years: 24.00    Types: Cigarettes  . Smokeless tobacco: Never Used  . Alcohol use 0.0 oz/week     Comment: occasional  . Drug use: No  . Sexual activity: Not on file   Other Topics Concern  . Not on file   Social History Narrative  . No narrative on file     PHYSICAL EXAM  Vitals:   07/04/17 1623  BP: (!) 145/90  Pulse: 96  Resp: 20  Weight: 261  lb (118.4 kg)  Height: 6\' 1"  (1.854 m)    Body mass index is 34.43 kg/m.   General: The patient is well-developed and well-nourished and in no acute distress  Musculoskeletal:  Left shoulder is tender over trapezius > subacromial bursa and ROM is reduced.     Neck:  The lower neck is tender,left > right.  Good ROM  Lower Back:  Tender over right lower lumbar paraspinal muscles and piriformis muscle.      Neurologic Exam  Mental status: She is alert and oriented. Her affect is better than at the last visit. She has no difficulty with speech. Focus is mildly reduced short-term memory seems appropriate.  Cranial nerves: Extraocular movements are full.  Facial strength and sensation is normal. Trapezius and sternocleidomastoid strength is normal. Palatal elevation of protrusion is midline. Hearing is normal and symmetric.    Motor:  Muscle bulk and tone are normal. Strength was normal in the right arm and she had slight weakness 4+/5 in the ulnar innervated muscles of the left. Strength is 4+/5 in the toes   Sensory: Sensory testing shows decreased touch/temp in left ulnar distribution for hand and the the left L5   she also has reduced sensation to touch and vibration in the feet.      Coordination: Cerebellar testing reveals good finger-nose-finger but poor heel-to-shin bilaterally.  Gait and station: Station is wide.   Positive Romberg.   Her gait is arthritic and wide.    Tandem is poor  Romberg is negative. .     Reflexes: Deep tendon reflexes are symmetric in her arms and legs (trace ankle).Marland Kitchen     DIAGNOSTIC DATA (LABS, IMAGING, TESTING) - I reviewed patient records, labs, notes, testing and imaging myself where available.     ASSESSMENT AND PLAN   Multiple sclerosis ([place])  Diabetic polyneuropathy associated with type 2 diabetes mellitus (HCC)  Chronic fatigue  Chronic pain associated with significant psychosocial dysfunction  Dysesthesia  Urinary  urgency  Insomnia, unspecified type    1.  She will continue Avonex for her. We will try to get her an emergency supply from [facility]. 2.   Continue hydrocodone up to twice a day. Temazepam at bedtime. She will remain off of diazepam. 3. .    rtc 5-6  months or sooner if new or worsening neurologic symptoms.Nanine Means. Felecia Shelling, MD, PhD 2/58/5277, 8:24 PM Certified in Neurology, Clinical Neurophysiology, Sleep Medicine, Pain Medicine and Neuroimaging  [facility] [address] [place], [place] [address] [phone]

## 2017-07-18 ENCOUNTER — Telehealth: Payer: Self-pay | Admitting: Neurology

## 2017-07-18 NOTE — Telephone Encounter (Signed)
Patient called office in reference to see if Dr. Felecia Shelling will give her a prescription for steroids.  Patient is in a lot of pain and swelling in feet, knees, and whole left arm symptoms have been present for almost a week.  [facility]).  Please call

## 2017-07-19 NOTE — Telephone Encounter (Signed)
LMOM that [name] is ooo this wk.  She has not been seen by another doc in our office, so she would need to be seen before a rx. could be sent in.  If she would like an appt., please call back/fim

## 2017-07-24 ENCOUNTER — Telehealth: Payer: Self-pay | Admitting: Neurology

## 2017-07-24 NOTE — Telephone Encounter (Signed)
Patient requesting refill of . cyclobenzaprine (FLEXERIL) 10 MG tablet. Patient says she has to pick up Rx.  Patient also would like a call back. She needs a soon appointment because her feet, legs and left arm are swollen for over a week

## 2017-07-25 MED ORDER — GABAPENTIN 400 MG PO CAPS: 800.0000 mg | ORAL_CAPSULE | Freq: Four times a day (QID) | ORAL | 11 refills | Status: DC

## 2017-07-25 MED ORDER — CYCLOBENZAPRINE HCL 10 MG PO TABS: 10.0000 mg | ORAL_TABLET | Freq: Three times a day (TID) | ORAL | 1 refills | Status: DC | PRN

## 2017-07-25 NOTE — Telephone Encounter (Signed)
Flexeril and Gabapentin escribed to [facility] per pt's request.  She c/o edema bilat lower legs, feet, worse around area of toe amputation, and left arm for one week.  Denies pitting, resp. distress.  Will check with [name] and call her back/fim

## 2017-07-25 NOTE — Telephone Encounter (Signed)
Per [name], edema should not be related to MS and  he is doubtful that he will have anything substantial to offer.  It would be best to see her pcp.  She verbalized understanding of same/fim

## 2017-07-25 NOTE — Addendum Note (Signed)
Addended by: France Ravens I on: 07/25/2017 08:56 AM   Modules accepted: Orders

## 2017-07-31 ENCOUNTER — Telehealth: Payer: Self-pay | Admitting: Neurology

## 2017-07-31 ENCOUNTER — Other Ambulatory Visit: Payer: Self-pay | Admitting: Neurology

## 2017-07-31 MED ORDER — HYDROCODONE-ACETAMINOPHEN 10-325 MG PO TABS
ORAL_TABLET | ORAL | 0 refills | Status: DC
Start: 2017-07-31 — End: 2017-09-03

## 2017-07-31 NOTE — Telephone Encounter (Signed)
Prescription printed.   I will sign and leave it in my area in the pod

## 2017-07-31 NOTE — Telephone Encounter (Signed)
Pt request refill for HYDROcodone-acetaminophen (NORCO) 10-325 MG tablet . °

## 2017-08-01 NOTE — Telephone Encounter (Signed)
Hydrocodone Rx signed, at front desk for pick up.

## 2017-08-16 DIAGNOSIS — B3731 Acute candidiasis of vulva and vagina: Secondary | ICD-10-CM | POA: Insufficient documentation

## 2017-08-31 ENCOUNTER — Telehealth: Payer: Self-pay | Admitting: Neurology

## 2017-08-31 NOTE — Telephone Encounter (Signed)
Patient called office in reference to having right leg swelling followed up with her PCP had US done to rule out DVT-negative.  Patient states with the swelling and cramping of the right leg she is falling everywhere.  [facility].  Please call

## 2017-08-31 NOTE — Telephone Encounter (Signed)
Patient called office requesting refill for HYDROcodone-acetaminophen (NORCO) 10-325 MG tablet. °

## 2017-09-03 ENCOUNTER — Telehealth: Payer: Self-pay | Admitting: Neurology

## 2017-09-03 MED ORDER — HYDROCODONE-ACETAMINOPHEN 10-325 MG PO TABS: ORAL_TABLET | ORAL | 0 refills | Status: DC

## 2017-09-03 NOTE — Telephone Encounter (Signed)
Pt called said she was returning RN's call

## 2017-09-03 NOTE — Telephone Encounter (Signed)
LMTC./fim 

## 2017-09-03 NOTE — Telephone Encounter (Signed)
I have spoken with Angela Solomon this afternoon.  She c/o continued edema right leg only. Sts. has seen  her pcp (Dr. Lajoyce Corners) and le doppler r/o dvt.  She is still looking for a possible cause for edema. Per [name], this is not related to her MS, and likely not related to Gabapentin as edema is unilateral.  She must f/u pcp for options.  She verbalized understanding of same/fim

## 2017-09-03 NOTE — Telephone Encounter (Signed)
Rx. awaiting [name] sig/fim 

## 2017-09-03 NOTE — Addendum Note (Signed)
Addended by: France Ravens I on: 09/03/2017 08:25 AM   Modules accepted: Orders

## 2017-09-12 ENCOUNTER — Other Ambulatory Visit: Payer: Self-pay | Admitting: Neurology

## 2017-09-17 ENCOUNTER — Telehealth: Payer: Self-pay | Admitting: Neurology

## 2017-09-17 MED ORDER — CYCLOBENZAPRINE HCL 10 MG PO TABS: 10.0000 mg | ORAL_TABLET | Freq: Three times a day (TID) | ORAL | 1 refills | Status: DC | PRN

## 2017-09-17 NOTE — Telephone Encounter (Signed)
Flexeril escribed to [facility] as requested/fim

## 2017-09-17 NOTE — Telephone Encounter (Signed)
Pt request refill for cyclobenzaprine (FLEXERIL) 10 MG tablet sent to [facility].

## 2017-10-08 ENCOUNTER — Telehealth: Payer: Self-pay | Admitting: Neurology

## 2017-10-08 MED ORDER — HYDROCODONE-ACETAMINOPHEN 10-325 MG PO TABS: ORAL_TABLET | ORAL | 0 refills | Status: DC

## 2017-10-08 NOTE — Telephone Encounter (Signed)
Pt calling for refill of HYDROcodone-acetaminophen (NORCO) 10-325 MG tablet °

## 2017-10-08 NOTE — Addendum Note (Signed)
Addended by: France Ravens I on: 10/08/2017 10:56 AM   Modules accepted: Orders

## 2017-10-08 NOTE — Telephone Encounter (Signed)
Rx. awaiting [name] sig/fim 

## 2017-10-08 NOTE — Telephone Encounter (Signed)
Rx. up front [facility]/fim 

## 2017-11-07 ENCOUNTER — Other Ambulatory Visit: Payer: Self-pay

## 2017-11-07 ENCOUNTER — Encounter: Payer: Self-pay | Admitting: Neurology

## 2017-11-07 ENCOUNTER — Ambulatory Visit (INDEPENDENT_AMBULATORY_CARE_PROVIDER_SITE_OTHER): Payer: Medicare Other | Admitting: Neurology

## 2017-11-07 VITALS — BP 153/96 | HR 106 | Resp 20 | Wt 276.0 lb

## 2017-11-07 DIAGNOSIS — G8929 Other chronic pain: Secondary | ICD-10-CM | POA: Diagnosis not present

## 2017-11-07 DIAGNOSIS — E1142 Type 2 diabetes mellitus with diabetic polyneuropathy: Secondary | ICD-10-CM

## 2017-11-07 DIAGNOSIS — M5416 Radiculopathy, lumbar region: Secondary | ICD-10-CM | POA: Diagnosis not present

## 2017-11-07 DIAGNOSIS — G35 Multiple sclerosis: Secondary | ICD-10-CM

## 2017-11-07 DIAGNOSIS — R260 Ataxic gait: Secondary | ICD-10-CM | POA: Diagnosis not present

## 2017-11-07 DIAGNOSIS — M5440 Lumbago with sciatica, unspecified side: Secondary | ICD-10-CM

## 2017-11-07 MED ORDER — CYCLOBENZAPRINE HCL 10 MG PO TABS: 10.0000 mg | ORAL_TABLET | Freq: Three times a day (TID) | ORAL | 11 refills | Status: DC | PRN

## 2017-11-07 MED ORDER — HYDROCODONE-ACETAMINOPHEN 10-325 MG PO TABS: ORAL_TABLET | ORAL | 0 refills | Status: DC

## 2017-11-07 NOTE — Progress Notes (Signed)
[facility]  PATIENT: Angela Solomon DOB: [birth year]  REFERRING CLINICIAN: Dr. Rita Ohara  HISTORY FROM: patient REASON FOR VISIT: MS   HISTORICAL  CHIEF COMPLAINT:  Chief Complaint  Patient presents with  . Multiple Sclerosis    Sts. she continues to tolerate Avonex well.  C/O nonradiating lbp onset 2 mos. ago without known injury.  Sts. bd. surgars have been running high.  Sts. she has a lipoma in left leg that causes edema. Still sees podiatry for problems with right foot/fim    HISTORY OF PRESENT ILLNESS:   Angela Solomon is a 41 year old woman with relapsing remitting multiple sclerosis.     Update 11/07/2017:    She is on Avonex.   She tolerates it well.   No definite exacerbations.    She denies any change in gait or numbness/weakness.   Bladder function is fine.    Vision is fine.  The MRI of the brain 04/24/2016 showed lesions consistent with MS. When compared to the 2014 MRI there were no changes. MRI of the cervical spine 04/24/2016 showed multiple plaques in the cervical spinal cord consistent with MS. When compared to the MRI dated 05/20/2014, there was no interval change.  Her worse pain is in the right leg.   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  She has a f/u visit in December.   She has a dislocation in her left toes and sees podiatry (Dr. Gean Quint).      For her back, she is on Flexeril tid, amitriptyline gabapentin 800 mg po qid and hydrocodone bid to tid.      From 07/04/2017:  MS:   She has been stable on Avonex as she tolerates it well. She has stopped and started the medication several times due to difficulties with insurance and ran out a couple months ago. We are working with her to try to get this reinstated again.  Gait/strength:   Feels her gait is poor able to walk without a walker now. Gait worsened after her back surgery and she continues to report some weakness. 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   She has diabetes and has a superimposed diabetic polyneuropathy with her radicular symptoms.   She notes some benefit with amitriptyline and gabapentin.     Bladder:   She notes some urinary frequency and urgency that has not had incontinence.  Vision: She denies any MS related vision problems.    Mood/Cognition:      She has had depression and anxiety. Depression worsened around the time she dated her back operated on. She only feels she gets a little bit of benefit from medications.    She feels her short-term memory is poor at times but notes no major cognitive problems.  Fatigue/sleep:   She notes physical morbid cognitive fatigue. Sleep is poor. She is having difficulty both with sleep onset and sleep maintenance insomnia. She feels the amitriptyline and gabapentin have helped her a little bit..   Lower Back Pain: In July 2016 she had surgery for a lumbar radiculopathy (Dr. Hal Neer).   She had a large disc herniation at L4-L5.     Pain improved after surgery but she notes some continued LBP and feels ROM is worse.    Neck Pain/shoulder pain:   She continues to have a lot of neck  pain and left greater than right shoulder pain. She has seen orthopedics about the shoulder and has had injections.  ---------------------------------------  MS History:   She presented in 2003 with gait ataxia, slurred speech and right sided paresthesias. Imaging studies were performed and she was diagnosed with multiple sclerosis. Initially, she was placed on Rebif but had skin reactions and switched to Copaxone. On Copaxone, she had an immediate hypersensitivity response and it was discontinued. She has been on Avonex most of the time but has been off for months at a time when she has had insurance difficulties.      Review of MRI's: 1. MRI of the cervical spine dated 05/20/2014. It showed enhancing spinal cord lesions  consistent with MS. C2-C3 there was a left lateral focus. Also at C2-C3 there was a posterior focus. At C3-C4 there was a right posterior lateral focus. At C4 there was a dorsal focus and at C5 there was another dorsal focus. 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 There are multilevel degenerative changes most pronounced at C7-T1 where there could be dynamic impingement of the right C8 nerve root.  2. MRI of the brain dated 09/28/2013 showing diffuse white matter foci in the periventricular and deep white matter consistent with [place].  Although there were no enhancing foci, when compared to a previous MRI dated 10/11/2010, there was some progression.  3. An MRI of the lumbar spine dated 04/27/2015 showed a large disc extrusion with sequestered fragment causing bilateral L5 nerve root compression.   4. MRI of the brain with and without contrast dated 10/16/2008 showed enhancing lesions.  She was reportedly off medication at that time.   REVIEW OF SYSTEMS:  Constitutional: No fevers, chills, sweats, or change in appetite.   Fatigue, poor sleep Eyes: No visual changes, double vision, eye pain Ear, nose and throat: No hearing loss, ear pain, nasal congestion, sore throat Cardiovascular: No chest pain, palpitations Respiratory:  No shortness of breath at rest or with exertion.   No wheezes GastrointestinaI: No nausea, vomiting, diarrhea, abdominal pain, fecal incontinence Genitourinary:  No dysuria, urinary retention or frequency.  No nocturia. Musculoskeletal:  as above. Integumentary: No rash, pruritus, skin lesions Neurological: as above Psychiatric: No depression at this time.  No anxiety Endocrine: No palpitations, diaphoresis, change in appetite, change in weigh or increased thirst Hematologic/Lymphatic:  No anemia, purpura, petechiae. Allergic/Immunologic: No itchy/runny eyes, nasal congestion, recent allergic reactions, rashes  ALLERGIES: Allergies  Allergen Reactions  . Atorvastatin  Other (See Comments)    Myalgia Myalgia  . Glatiramer Anaphylaxis    Generic Copaxone  . Lisinopril Anaphylaxis  . Niacin Diarrhea  . Valproic Acid Anaphylaxis  . Buprenorphine Hcl Hives  . Morphine And Related Hives  . Aspirin Nausea And Vomiting    Fevers and GI upset  . Hydrocodone-Acetaminophen Nausea And Vomiting    Pt states can take Hydrocodone but has a reaction to a additive in vicodin  . Zithromax [Azithromycin] Rash    HOME MEDICATIONS: Outpatient Medications Prior to Visit  Medication Sig Dispense Refill  . acetaminophen (TYLENOL) 500 MG tablet Take 1,000 mg by mouth every 6 (six) hours as needed for mild pain or moderate pain.    Marland Kitchen albuterol (PROVENTIL HFA;VENTOLIN HFA) 108 (90 BASE) MCG/ACT inhaler Inhale 1 puff into the lungs as needed for wheezing or shortness of breath. Reported on 04/06/2016    . amitriptyline (ELAVIL) 75 MG tablet TAKE ONE TABLET BY MOUTH EVERY NIGHT AT BEDTIME 30 tablet  11  . B-D INS SYR ULTRAFINE 1CC/31G 31G X 5/16" 1 ML MISC See admin instructions.  0  . carvedilol (COREG) 25 MG tablet Take 25 mg by mouth 2 (two) times daily.     . DULoxetine (CYMBALTA) 60 MG capsule Take 1 capsule (60 mg total) by mouth daily. 30 capsule 11  . ferrous sulfate 325 (65 FE) MG tablet Take 325 mg by mouth daily.     . fluconazole (DIFLUCAN) 150 MG tablet TK 1 T PO  NOW AND IF NOT BETTER REPEAT IN 3 DAYS  0  . fluticasone (FLONASE) 50 MCG/ACT nasal spray Place 2 sprays into both nostrils as needed for allergies.     Marland Kitchen gabapentin (NEURONTIN) 400 MG capsule Take 2 capsules (800 mg total) by mouth 4 (four) times daily. 240 capsule 11  . insulin glargine (LANTUS) 100 UNIT/ML injection Inject into the skin at bedtime.    . interferon beta-1a (AVONEX) 30 MCG injection Inject 30 mcg into the muscle every 7 (seven) days. 12 each 3  . losartan-hydrochlorothiazide (HYZAAR) 100-25 MG per tablet Take 1 tablet by mouth daily.     . pantoprazole (PROTONIX) 40 MG tablet Take 40 mg  by mouth at bedtime.     . promethazine (PHENERGAN) 25 MG tablet Take 1 tablet (25 mg total) by mouth every 6 (six) hours as needed for nausea or vomiting. 30 tablet 1  . temazepam (RESTORIL) 15 MG capsule Take 1 capsule (15 mg total) by mouth at bedtime as needed for sleep. 30 capsule 5  . cyclobenzaprine (FLEXERIL) 10 MG tablet Take 1 tablet (10 mg total) by mouth 3 (three) times daily as needed for muscle spasms. 90 tablet 1  . HYDROcodone-acetaminophen (NORCO) 10-325 MG tablet One po bid prn 60 tablet 0  . amLODipine (NORVASC) 5 MG tablet Take 5 mg by mouth.     No facility-administered medications prior to visit.     PAST MEDICAL HISTORY: Past Medical History:  Diagnosis Date  . Anemia   . Depression   . Diabetes mellitus without complication (HCC)    dx at age 25  type 71  . GERD (gastroesophageal reflux disease)   . Headache   . Hypertension   . Multiple sclerosis ([place])   . Neuromuscular disorder ([place])    dx 2003 with MS  . Neuropathy   . PONV (postoperative nausea and vomiting)    sometimes...........  Marland Kitchen Vision abnormalities     PAST SURGICAL HISTORY: Past Surgical History:  Procedure Laterality Date  . CHOLECYSTECTOMY    . DENTAL SURGERY    . right foot surgery     remove a piece of glass  (had c/o for about 1 yr)  . TONSILLECTOMY    . TUBAL LIGATION      FAMILY HISTORY: Family History  Problem Relation Age of Onset  . Hypertension Mother   . Diabetes Mother   . Stroke Mother   . Heart attack Mother   . Peripheral vascular disease Mother     SOCIAL HISTORY:  Social History   Socioeconomic History  . Marital status: Married    Spouse name: Not on file  . Number of children: Not on file  . Years of education: Not on file  . Highest education level: Not on file  Social Needs  . Financial resource strain: Not on file  . Food insecurity - worry: Not on file  . Food insecurity - inability: Not on file  . Transportation needs - medical:  Not on file  .  Transportation needs - non-medical: Not on file  Occupational History  . Not on file  Tobacco Use  . Smoking status: Current Every Day Smoker    Packs/day: 1.00    Years: 24.00    Pack years: 24.00    Types: Cigarettes  . Smokeless tobacco: Never Used  Substance and Sexual Activity  . Alcohol use: Yes    Alcohol/week: 0.0 oz    Comment: occasional  . Drug use: No  . Sexual activity: Not on file  Other Topics Concern  . Not on file  Social History Narrative  . Not on file     PHYSICAL EXAM  Vitals:   11/07/17 1602  BP: (!) 153/96  Pulse: (!) 106  Resp: 20  Weight: 276 lb (125.2 kg)    Body mass index is 36.41 kg/m.   General: The patient is well-developed and well-nourished and in no acute distress  Musculoskeletal:  She has mild shoulder tenderness over the left subacromial bursa.     Neck:  The lower neck is tender,left > right.  Good ROM  Lower Back:  She is tender over the lower lumbar paraspinal muscles on the right. Additionally there is right piriformis tenderness..      Neurologic Exam  Mental status: She is alert and oriented. The affect is normal.. She has no difficulty with speech. Focus is mildly reduced short-term memory seems appropriate.  Cranial nerves: Extraocular movements are full.  Facial strength and sensation is normal. Trapezius and sternocleidomastoid strength is normal. Palatal elevation of protrusion is midline. Hearing is normal and symmetric.    Motor:  Muscle bulk and tone are normal. Strength was normal in the right arm and she had slight weakness 4+/5 in the ulnar innervated muscles of the left. Strength is 4+/5 in the toes. The left great toe has been surgically removed   Sensory: There is reduced sensation in the feet and the right L5 distribution.   Coordination: Cerebellar testing reveals good finger-nose-finger but poor heel-to-shin bilaterally.  Gait and station: Station is wide.   The gait is mildly arthritic and mildly  wide.  Tandem is poor  Romberg is borderline.   Reflexes: Deep tendon reflexes are symmetric in her arms and legs (trace ankle).Marland Kitchen     DIAGNOSTIC DATA (LABS, IMAGING, TESTING) - I reviewed patient records, labs, notes, testing and imaging myself where available.     ASSESSMENT AND PLAN   Multiple sclerosis (HCC)  Chronic bilateral low back pain with sciatica, sciatica laterality unspecified  Lumbar radicular pain  Diabetic polyneuropathy associated with type 2 diabetes mellitus ([place])  Ataxic gait    1.  Continue Avonex 30 g weekly. 2.  For pain, continue hydrocodone up to twice a day and gabapentin and flexeril. 3.    Continue temazepam for insomnia.  4    rtc 5-6  months or sooner if new or worsening neurologic symptoms.Nanine Means. Felecia Shelling, MD, PhD 97/67/3419, 3:79 PM Certified in Neurology, Clinical Neurophysiology, Sleep Medicine, Pain Medicine and Neuroimaging  [facility] [address], [place], [place] [address] [phone]

## 2017-11-27 DIAGNOSIS — E65 Localized adiposity: Secondary | ICD-10-CM | POA: Insufficient documentation

## 2017-11-30 ENCOUNTER — Telehealth: Payer: Self-pay

## 2017-11-30 NOTE — Telephone Encounter (Signed)
Patient is calling for a refill on her hydrocodone. She does not need to pick this up today.

## 2017-12-05 MED ORDER — HYDROCODONE-ACETAMINOPHEN 10-325 MG PO TABS: ORAL_TABLET | ORAL | 0 refills | Status: DC

## 2017-12-05 NOTE — Telephone Encounter (Signed)
Rx. up front [facility]/fim 

## 2017-12-05 NOTE — Telephone Encounter (Signed)
Rx. awaiting [name] sig/fim 

## 2017-12-05 NOTE — Addendum Note (Signed)
Addended by: France Ravens I on: 12/05/2017 10:30 AM   Modules accepted: Orders

## 2017-12-13 ENCOUNTER — Telehealth: Payer: Self-pay | Admitting: Neurology

## 2017-12-13 NOTE — Telephone Encounter (Signed)
Pt called she is taking temazepam (RESTORIL) 15 MG capsule 2 tabs at bedtime because she was not sleeping at night. Pt said she is still not sleeping. Pt said she is more agitated, depression is worse without sleeping. Please call to advise

## 2017-12-14 MED ORDER — TRAZODONE HCL 100 MG PO TABS: 100.0000 mg | ORAL_TABLET | Freq: Every day | ORAL | 3 refills | Status: DC

## 2017-12-14 NOTE — Telephone Encounter (Signed)
Spoke with Angela Solomon and per [name], advised that she continue Temazepam (15mg  only--do not take 30mg ), stop all otc sleep meds, and add Trazodone 100mg  qhs.  She verbalized understanding of same.  Trazodone rx. escribed to [facility] per her request/fim

## 2018-01-07 ENCOUNTER — Telehealth: Payer: Self-pay | Admitting: Neurology

## 2018-01-07 MED ORDER — HYDROCODONE-ACETAMINOPHEN 10-325 MG PO TABS: ORAL_TABLET | ORAL | 0 refills | Status: DC

## 2018-01-07 NOTE — Telephone Encounter (Signed)
Rx. up front [facility]/fim 

## 2018-01-07 NOTE — Telephone Encounter (Signed)
Rx. awaiting [name] sig/fim 

## 2018-01-07 NOTE — Addendum Note (Signed)
Addended by: France Ravens I on: 01/07/2018 12:01 PM   Modules accepted: Orders

## 2018-01-07 NOTE — Telephone Encounter (Signed)
Pt is requesting a refill for HYDROcodone-acetaminophen (NORCO) 10-325 MG tablet

## 2018-02-05 ENCOUNTER — Telehealth: Payer: Self-pay | Admitting: Neurology

## 2018-02-05 NOTE — Telephone Encounter (Signed)
Pt asking for a refill prescription for HYDROcodone-acetaminophen (NORCO) 10-325 MG tablet

## 2018-02-06 MED ORDER — HYDROCODONE-ACETAMINOPHEN 10-325 MG PO TABS: ORAL_TABLET | ORAL | 0 refills | Status: DC

## 2018-02-06 NOTE — Addendum Note (Signed)
Addended by: France Ravens I on: 02/06/2018 08:30 AM   Modules accepted: Orders

## 2018-02-06 NOTE — Telephone Encounter (Signed)
Rx. awaiting [name] sig/fim 

## 2018-02-06 NOTE — Telephone Encounter (Signed)
Hydrocodone rx. up front [facility]/fim 

## 2018-02-11 ENCOUNTER — Other Ambulatory Visit: Payer: Self-pay | Admitting: Neurology

## 2018-02-14 ENCOUNTER — Telehealth: Payer: Self-pay | Admitting: Neurology

## 2018-02-14 MED ORDER — TEMAZEPAM 15 MG PO CAPS: ORAL_CAPSULE | ORAL | 0 refills | Status: DC

## 2018-02-14 NOTE — Telephone Encounter (Signed)
Spoke with [facility] and cancelled the Temazepam rx. that was sent to them on 02/12/18. and escribed Temazepam to [facility] as pt. requested/fim

## 2018-02-14 NOTE — Telephone Encounter (Signed)
Pt request rx for temazepam (RESTORIL) 15 MG capsule be sent to [facility]

## 2018-03-05 ENCOUNTER — Telehealth: Payer: Self-pay | Admitting: Neurology

## 2018-03-05 MED ORDER — HYDROCODONE-ACETAMINOPHEN 10-325 MG PO TABS: ORAL_TABLET | ORAL | 0 refills | Status: DC

## 2018-03-05 NOTE — Telephone Encounter (Signed)
Rx. up front [facility]/fim 

## 2018-03-05 NOTE — Telephone Encounter (Signed)
Pt request refill for HYDROcodone-acetaminophen (NORCO) 10-325 MG tablet . °

## 2018-03-11 DIAGNOSIS — L84 Corns and callosities: Secondary | ICD-10-CM | POA: Insufficient documentation

## 2018-04-08 ENCOUNTER — Telehealth: Payer: Self-pay | Admitting: Neurology

## 2018-04-08 MED ORDER — HYDROCODONE-ACETAMINOPHEN 10-325 MG PO TABS: ORAL_TABLET | ORAL | 0 refills | Status: DC

## 2018-04-08 NOTE — Addendum Note (Signed)
Addended by: France Ravens I on: 04/08/2018 11:36 AM   Modules accepted: Orders

## 2018-04-08 NOTE — Telephone Encounter (Signed)
Rx's up front [facility]/fim 

## 2018-04-08 NOTE — Telephone Encounter (Signed)
Pt calling for a refill prescription for HYDROcodone-acetaminophen (NORCO) 10-325 MG tablet

## 2018-04-08 NOTE — Telephone Encounter (Signed)
Rx. awaiting [name] sig/fim 

## 2018-04-19 ENCOUNTER — Other Ambulatory Visit: Payer: Self-pay | Admitting: Neurology

## 2018-04-19 ENCOUNTER — Telehealth: Payer: Self-pay | Admitting: Neurology

## 2018-04-19 MED ORDER — TRAZODONE HCL 100 MG PO TABS: 100.0000 mg | ORAL_TABLET | Freq: Every day | ORAL | 3 refills | Status: DC

## 2018-04-19 MED ORDER — TEMAZEPAM 15 MG PO CAPS: ORAL_CAPSULE | ORAL | 0 refills | Status: DC

## 2018-04-19 NOTE — Telephone Encounter (Signed)
Trazodone escribed to [facility], Temazepam faxed to [facility]/fim

## 2018-04-19 NOTE — Telephone Encounter (Signed)
Pt request refill for temazepam (RESTORIL) 15 MG capsule and traZODone (DESYREL) 100 MG tablet sent to [place]. Pt is out of the temazepam and 2 pills trazodone left. Pt is aware the clinic closes at noon today

## 2018-05-07 ENCOUNTER — Other Ambulatory Visit: Payer: Self-pay

## 2018-05-07 ENCOUNTER — Encounter: Payer: Self-pay | Admitting: Neurology

## 2018-05-07 ENCOUNTER — Ambulatory Visit (INDEPENDENT_AMBULATORY_CARE_PROVIDER_SITE_OTHER): Payer: Medicare Other | Admitting: Neurology

## 2018-05-07 VITALS — BP 179/109 | HR 111 | Resp 20 | Ht 73.0 in | Wt 277.0 lb

## 2018-05-07 DIAGNOSIS — E1142 Type 2 diabetes mellitus with diabetic polyneuropathy: Secondary | ICD-10-CM | POA: Diagnosis not present

## 2018-05-07 DIAGNOSIS — R269 Unspecified abnormalities of gait and mobility: Secondary | ICD-10-CM

## 2018-05-07 DIAGNOSIS — G35 Multiple sclerosis: Secondary | ICD-10-CM

## 2018-05-07 DIAGNOSIS — R208 Other disturbances of skin sensation: Secondary | ICD-10-CM

## 2018-05-07 DIAGNOSIS — M5416 Radiculopathy, lumbar region: Secondary | ICD-10-CM

## 2018-05-07 MED ORDER — HYDROCODONE-ACETAMINOPHEN 10-325 MG PO TABS: ORAL_TABLET | ORAL | 0 refills | Status: DC

## 2018-05-07 NOTE — Progress Notes (Signed)
[facility]  PATIENT: Angela Solomon DOB: [birth year]  REFERRING CLINICIAN: Dr. Rita Ohara  HISTORY FROM: patient REASON FOR VISIT: MS   HISTORICAL  CHIEF COMPLAINT:  Chief Complaint  Patient presents with  . Multiple Sclerosis    Sts. she continues to tolerate Avonex well.  Sts. due to continued right leg pain, pcp ordered an u/s of her right leg which showed a lipoma.  No plans to remove it right now--they will just monitor/fim    HISTORY OF PRESENT ILLNESS:   Angela Solomon is a 42 year old woman with relapsing remitting multiple sclerosis.   Update 05/07/2018:   She feels her MS is mostly stable.   She is on Avonex and she tolerates it well.       She fell on the front porch steps and landed on her left knee and elbow.    She fell a few other times the last month also.     She feels the right leg is weaker than her left.   She feels her bladder is ok with some frequency.   She wears pads due to some leakage at times.   She feels her vision is about the same.   Her IOP was mildly elevated but was reportedly better at the last visit.   She reports no diabetic changes in eyes at last exam.    She is sleeping better now than she did in the past.   Trazodone and amitriptyline at bedtime has helped her sleep.  Amitriptyline also helps her dysesthetic pain.    She is 2 x hydrocodones daily.   She has right leg pain and has a lipoma    Update 11/07/2017:    She is on Avonex.   She tolerates it well.   No definite exacerbations.    She denies any change in gait or numbness/weakness.   Bladder function is fine.    Vision is fine.  The MRI of the brain 04/24/2016 showed lesions consistent with MS. When compared to the 2014 MRI there were no changes. MRI of the cervical spine 04/24/2016 showed multiple plaques in the cervical spinal cord consistent with MS. When compared to the MRI dated 05/20/2014, there was no interval change.  Her worse pain is in the right leg.   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  She has a f/u visit in December.   She has a dislocation in her left toes and sees podiatry (Dr. Gean Quint).      For her back, she is on Flexeril tid, amitriptyline gabapentin 800 mg po qid and hydrocodone bid to tid.      From 07/04/2017:  MS:   She has been stable on Avonex as she tolerates it well. She has stopped and started the medication several times due to difficulties with insurance and ran out a couple months ago. We are working with her to try to get this reinstated again.  Gait/strength:   Feels her gait is poor able to walk without a walker now. Gait worsened after her back surgery and she continues to report some weakness. 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   She has diabetes and has a superimposed diabetic polyneuropathy with her radicular symptoms.   She notes some benefit with amitriptyline and gabapentin.     Bladder:   She notes  some urinary frequency and urgency that has not had incontinence.  Vision: She denies any MS related vision problems.    Mood/Cognition:      She has had depression and anxiety. Depression worsened around the time she dated her back operated on. She only feels she gets a little bit of benefit from medications.    She feels her short-term memory is poor at times but notes no major cognitive problems.  Fatigue/sleep:   She notes physical morbid cognitive fatigue. Sleep is poor. She is having difficulty both with sleep onset and sleep maintenance insomnia. She feels the amitriptyline and gabapentin have helped her a little bit..   Lower Back Pain: In July 2016 she had surgery for a lumbar radiculopathy (Dr. Hal Neer).   She had a large disc herniation at L4-L5.     Pain improved after surgery but she notes some continued LBP and feels ROM is worse.    Neck Pain/shoulder pain:   She continues to have a  lot of neck pain and left greater than right shoulder pain. She has seen orthopedics about the shoulder and has had injections.  ---------------------------------------  MS History:   She presented in 2003 with gait ataxia, slurred speech and right sided paresthesias. Imaging studies were performed and she was diagnosed with multiple sclerosis. Initially, she was placed on Rebif but had skin reactions and switched to Copaxone. On Copaxone, she had an immediate hypersensitivity response and it was discontinued. She has been on Avonex most of the time but has been off for months at a time when she has had insurance difficulties.      Review of MRI's: 1. MRI of the cervical spine dated 05/20/2014. It showed enhancing spinal cord lesions consistent with MS. C2-C3 there was a left lateral focus. Also at C2-C3 there was a posterior focus. At C3-C4 there was a right posterior lateral focus. At C4 there was a dorsal focus and at C5 there was another dorsal focus. 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 There are multilevel degenerative changes most pronounced at C7-T1 where there could be dynamic impingement of the right C8 nerve root.  2. MRI of the brain dated 09/28/2013 showing diffuse white matter foci in the periventricular and deep white matter consistent with [place].  Although there were no enhancing foci, when compared to a previous MRI dated 10/11/2010, there was some progression.  3. An MRI of the lumbar spine dated 04/27/2015 showed a large disc extrusion with sequestered fragment causing bilateral L5 nerve root compression.   4. MRI of the brain with and without contrast dated 10/16/2008 showed enhancing lesions.  She was reportedly off medication at that time.   REVIEW OF SYSTEMS:  Constitutional: No fevers, chills, sweats, or change in appetite.   Fatigue, poor sleep Eyes: No visual changes, double vision, eye pain Ear, nose and throat: No hearing loss, ear pain, nasal congestion, sore  throat Cardiovascular: No chest pain, palpitations Respiratory:  No shortness of breath at rest or with exertion.   No wheezes GastrointestinaI: No nausea, vomiting, diarrhea, abdominal pain, fecal incontinence Genitourinary:  No dysuria, urinary retention or frequency.  No nocturia. Musculoskeletal:  as above. Integumentary: No rash, pruritus, skin lesions Neurological: as above Psychiatric: No depression at this time.  No anxiety Endocrine: No palpitations, diaphoresis, change in appetite, change in weigh or increased thirst Hematologic/Lymphatic:  No anemia, purpura, petechiae. Allergic/Immunologic: No itchy/runny eyes, nasal congestion, recent allergic reactions, rashes  ALLERGIES: Allergies  Allergen Reactions  .  Atorvastatin Other (See Comments)    Myalgia Myalgia  . Glatiramer Anaphylaxis    Generic Copaxone  . Lisinopril Anaphylaxis  . Niacin Diarrhea  . Valproic Acid Anaphylaxis  . Buprenorphine Hcl Hives  . Morphine And Related Hives  . Aspirin Nausea And Vomiting    Fevers and GI upset  . Hydrocodone-Acetaminophen Nausea And Vomiting    Pt states can take Hydrocodone but has a reaction to a additive in vicodin  . Zithromax [Azithromycin] Rash    HOME MEDICATIONS: Outpatient Medications Prior to Visit  Medication Sig Dispense Refill  . acetaminophen (TYLENOL) 500 MG tablet Take 1,000 mg by mouth every 6 (six) hours as needed for mild pain or moderate pain.    Marland Kitchen albuterol (PROVENTIL HFA;VENTOLIN HFA) 108 (90 BASE) MCG/ACT inhaler Inhale 1 puff into the lungs as needed for wheezing or shortness of breath. Reported on 04/06/2016    . amitriptyline (ELAVIL) 75 MG tablet TAKE ONE TABLET BY MOUTH EVERY NIGHT AT BEDTIME 30 tablet 11  . B-D INS SYR ULTRAFINE 1CC/31G 31G X 5/16" 1 ML MISC See admin instructions.  0  . carvedilol (COREG) 25 MG tablet Take 25 mg by mouth 2 (two) times daily.     . cyclobenzaprine (FLEXERIL) 10 MG tablet Take 1 tablet (10 mg total) 3 (three)  times daily as needed by mouth for muscle spasms. 90 tablet 11  . ferrous sulfate 325 (65 FE) MG tablet Take 325 mg by mouth daily.     . fluticasone (FLONASE) 50 MCG/ACT nasal spray Place 2 sprays into both nostrils as needed for allergies.     Marland Kitchen gabapentin (NEURONTIN) 400 MG capsule Take 2 capsules (800 mg total) by mouth 4 (four) times daily. 240 capsule 11  . insulin glargine (LANTUS) 100 UNIT/ML injection Inject into the skin at bedtime.    . interferon beta-1a (AVONEX) 30 MCG injection Inject 30 mcg into the muscle every 7 (seven) days. 12 each 3  . losartan-hydrochlorothiazide (HYZAAR) 100-25 MG per tablet Take 1 tablet by mouth daily.     . pantoprazole (PROTONIX) 40 MG tablet Take 40 mg by mouth at bedtime.     . promethazine (PHENERGAN) 25 MG tablet Take 1 tablet (25 mg total) by mouth every 6 (six) hours as needed for nausea or vomiting. 30 tablet 1  . temazepam (RESTORIL) 15 MG capsule TAKE 1 CAPSULE BY MOUTH EVERY DAY AT BEDTIME AS NEEDED FOR SLEEP 30 capsule 0  . traZODone (DESYREL) 100 MG tablet Take 1 tablet (100 mg total) by mouth at bedtime. 30 tablet 3  . HYDROcodone-acetaminophen (NORCO) 10-325 MG tablet One po bid prn 60 tablet 0  . amLODipine (NORVASC) 5 MG tablet Take 5 mg by mouth.    . DULoxetine (CYMBALTA) 60 MG capsule Take 1 capsule (60 mg total) by mouth daily. 30 capsule 11  . fluconazole (DIFLUCAN) 150 MG tablet TK 1 T PO  NOW AND IF NOT BETTER REPEAT IN 3 DAYS  0   No facility-administered medications prior to visit.     PAST MEDICAL HISTORY: Past Medical History:  Diagnosis Date  . Anemia   . Depression   . Diabetes mellitus without complication (HCC)    dx at age 58  type 73  . GERD (gastroesophageal reflux disease)   . Headache   . Hypertension   . Multiple sclerosis ([place])   . Neuromuscular disorder ([place])    dx 2003 with MS  . Neuropathy   . PONV (postoperative nausea and  vomiting)    sometimes...........  Marland Kitchen Vision abnormalities     PAST  SURGICAL HISTORY: Past Surgical History:  Procedure Laterality Date  . CHOLECYSTECTOMY    . DENTAL SURGERY    . LUMBAR LAMINECTOMY WITH COFLEX 1 LEVEL Bilateral 06/29/2015   Procedure: LUMBAR LAMINECTOMY WITH COFLEX 1 LEVEL;  Surgeon: Karie Chimera, MD;  Location: [facility];  Service: Neurosurgery;  Laterality: Bilateral;  LUMBAR LAMINECTOMY WITH COFLEX 1 LEVEL  . right foot surgery     remove a piece of glass  (had c/o for about 1 yr)  . TONSILLECTOMY    . TUBAL LIGATION    . WOUND EXPLORATION N/A 07/22/2015   Procedure: WOUND EXPLORATION;  Surgeon: Karie Chimera, MD;  Location: [facility];  Service: Neurosurgery;  Laterality: N/A;    FAMILY HISTORY: Family History  Problem Relation Age of Onset  . Hypertension Mother   . Diabetes Mother   . Stroke Mother   . Heart attack Mother   . Peripheral vascular disease Mother     SOCIAL HISTORY:  Social History   Socioeconomic History  . Marital status: Married    Spouse name: Not on file  . Number of children: Not on file  . Years of education: Not on file  . Highest education level: Not on file  Occupational History  . Not on file  Social Needs  . Financial resource strain: Not on file  . Food insecurity:    Worry: Not on file    Inability: Not on file  . Transportation needs:    Medical: Not on file    Non-medical: Not on file  Tobacco Use  . Smoking status: Current Every Day Smoker    Packs/day: 1.00    Years: 24.00    Pack years: 24.00    Types: Cigarettes  . Smokeless tobacco: Never Used  Substance and Sexual Activity  . Alcohol use: Yes    Alcohol/week: 0.0 oz    Comment: occasional  . Drug use: No  . Sexual activity: Not on file  Lifestyle  . Physical activity:    Days per week: Not on file    Minutes per session: Not on file  . Stress: Not on file  Relationships  . Social connections:    Talks on phone: Not on file    Gets together: Not on file    Attends religious service: Not on file    Active  member of club or organization: Not on file    Attends meetings of clubs or organizations: Not on file    Relationship status: Not on file  . Intimate partner violence:    Fear of current or ex partner: Not on file    Emotionally abused: Not on file    Physically abused: Not on file    Forced sexual activity: Not on file  Other Topics Concern  . Not on file  Social History Narrative  . Not on file     PHYSICAL EXAM  Vitals:   05/07/18 1305  BP: (!) 179/109  Pulse: (!) 111  Resp: 20  Weight: 277 lb (125.6 kg)  Height: 6\' 1"  (1.854 m)    Body mass index is 36.55 kg/m.   General: The patient is well-developed and well-nourished and in no acute distress   Lower Back: She has some tenderness in the lower lumbar paraspinal muscles.  Neurologic Exam  Mental status: She is alert and oriented. The affect is normal.. She has no difficulty with speech.  Focus is mildly reduced short-term memory seems appropriate.  Cranial nerves: Extraocular movements are full.  Facial strength and sensation is normal.  Trapezius strength is normal.  . Palatal elevation of protrusion is midline. Hearing is normal and symmetric.    Motor:  Muscle bulk and tone are normal. Strength was normal in the right arm and she had slight weakness 4+/5 in the ulnar innervated muscles of the left. Strength is 4+/5 in the toes. The left great toe has been surgically removed   Sensory: There is reduced sensation in both feet, worse in the right L5  distribution  Coordination: Cerebellar testing reveals good finger-nose-finger but poor heel-to-shin bilaterally.  Gait and station: Station is wide.  Her gait is arthritic and mildly wide.  The tandem gait is poor.  Romberg is borderline.   Reflexes: Deep tendon reflexes are symmetric in her arms and legs (trace ankle).Marland Kitchen     DIAGNOSTIC DATA (LABS, IMAGING, TESTING) - I reviewed patient records, labs, notes, testing and imaging myself where  available.     ASSESSMENT AND PLAN   Multiple sclerosis ([place])  Diabetic polyneuropathy associated with type 2 diabetes mellitus (HCC)  Lumbar radicular pain  Gait disturbance  Dysesthesia    1.  Continue Avonex 30 g weekly. 2.   For pain, continue hydrocodone up to twice a day and gabapentin and flexeril.  Renew hydrocodone.  3.   Use a cane or walker for safety.  4.   Rtc 5-6  months or sooner if new or worsening neurologic symptoms.Nanine Means. Felecia Shelling, MD, PhD 0/06/1218, 7:58 PM Certified in Neurology, Clinical Neurophysiology, Sleep Medicine, Pain Medicine and Neuroimaging  [facility] [address] [place], [place] [address] [phone]

## 2018-05-27 ENCOUNTER — Telehealth: Payer: Self-pay | Admitting: Neurology

## 2018-05-27 MED ORDER — TEMAZEPAM 15 MG PO CAPS: ORAL_CAPSULE | ORAL | 0 refills | Status: DC

## 2018-05-27 NOTE — Telephone Encounter (Signed)
Temazepam faxed to [facility] as requested/fim

## 2018-05-27 NOTE — Telephone Encounter (Signed)
Pt requesting a refill for temazepam (RESTORIL) 15 MG capsule, sent to [facility] on [address].

## 2018-06-05 ENCOUNTER — Other Ambulatory Visit: Payer: Self-pay | Admitting: *Deleted

## 2018-06-05 ENCOUNTER — Telehealth: Payer: Self-pay | Admitting: Neurology

## 2018-06-05 DIAGNOSIS — Z79899 Other long term (current) drug therapy: Secondary | ICD-10-CM

## 2018-06-05 DIAGNOSIS — G894 Chronic pain syndrome: Secondary | ICD-10-CM

## 2018-06-05 MED ORDER — HYDROCODONE-ACETAMINOPHEN 10-325 MG PO TABS: ORAL_TABLET | ORAL | 0 refills | Status: DC

## 2018-06-05 NOTE — Telephone Encounter (Signed)
Rx. awaiting [name] sig/fim 

## 2018-06-05 NOTE — Telephone Encounter (Signed)
Rx. up front [facility]/fim 

## 2018-06-05 NOTE — Telephone Encounter (Signed)
Pt requesting a refill for HYDROcodone-acetaminophen (NORCO) 10-325 MG tablet °

## 2018-07-03 ENCOUNTER — Telehealth: Payer: Self-pay | Admitting: Neurology

## 2018-07-03 MED ORDER — HYDROCODONE-ACETAMINOPHEN 10-325 MG PO TABS: ORAL_TABLET | ORAL | 0 refills | Status: DC

## 2018-07-03 MED ORDER — TEMAZEPAM 15 MG PO CAPS: ORAL_CAPSULE | ORAL | 0 refills | Status: DC

## 2018-07-03 NOTE — Telephone Encounter (Signed)
Rx's up front [facility]/fim 

## 2018-07-03 NOTE — Telephone Encounter (Signed)
Pt requesting refills for HYDROcodone-acetaminophen (NORCO) 10-325 MG tablet and  temazepam (RESTORIL) 15 MG capsule

## 2018-07-03 NOTE — Telephone Encounter (Signed)
Rx's awaiting [name] sig/fim 

## 2018-07-04 ENCOUNTER — Other Ambulatory Visit: Payer: Self-pay | Admitting: Neurology

## 2018-08-06 ENCOUNTER — Telehealth: Payer: Self-pay | Admitting: Neurology

## 2018-08-06 ENCOUNTER — Other Ambulatory Visit: Payer: Self-pay | Admitting: *Deleted

## 2018-08-06 DIAGNOSIS — M5416 Radiculopathy, lumbar region: Secondary | ICD-10-CM

## 2018-08-06 DIAGNOSIS — Z79899 Other long term (current) drug therapy: Secondary | ICD-10-CM

## 2018-08-06 DIAGNOSIS — E1142 Type 2 diabetes mellitus with diabetic polyneuropathy: Secondary | ICD-10-CM

## 2018-08-06 MED ORDER — HYDROCODONE-ACETAMINOPHEN 10-325 MG PO TABS: ORAL_TABLET | ORAL | 0 refills | Status: DC

## 2018-08-06 NOTE — Telephone Encounter (Signed)
Rx. awaiting [name] sig/fim 

## 2018-08-06 NOTE — Progress Notes (Signed)
UDS/fim

## 2018-08-06 NOTE — Telephone Encounter (Signed)
Patient called and requested a refill for the rx Hydrocodone.

## 2018-08-06 NOTE — Telephone Encounter (Signed)
Hydrocodone rx. up front [facility]/fim 

## 2018-08-07 ENCOUNTER — Other Ambulatory Visit: Payer: Medicare Other

## 2018-08-07 ENCOUNTER — Telehealth: Payer: Self-pay | Admitting: *Deleted

## 2018-08-07 NOTE — Telephone Encounter (Signed)
Pt. was to have UDS (random screening in compliance with opiate rx'ing guidelines).  She presented to the office today, declined UDS and left without her rx.  Per v/o Dr. Felecia Shelling, he will no longer be able to provide this rx. for her.  Rx. printed 08/06/18 has been destroyed/fim

## 2018-08-08 ENCOUNTER — Telehealth: Payer: Self-pay | Admitting: Neurology

## 2018-08-08 ENCOUNTER — Other Ambulatory Visit: Payer: Medicare Other

## 2018-08-08 NOTE — Telephone Encounter (Signed)
error 

## 2018-08-08 NOTE — Telephone Encounter (Addendum)
I called the Angela Solomon to make an appt, f/u is 9/17.  Angela Solomon said she wanted Dr Felecia Shelling to know she is wanting an MRI of the neck and across the shoulders. She is having increased pain. States she is having trouble turning her head.

## 2018-08-08 NOTE — Telephone Encounter (Signed)
Pt called back to advise she has a lump on the right side of her neck. Pt states she does not have thyroid problems. Pt states she could not remember if she gave that information in the previous conversation. I advised her I would send the message for her.

## 2018-08-08 NOTE — Telephone Encounter (Signed)
Pt is wanting to make another appt. Please call to advise the pt, she can be reached [phone].

## 2018-08-09 NOTE — Telephone Encounter (Signed)
Spoke with Jake and explained new MRI would be ordered if needed when Dr. Felecia Shelling sees her at her pending appt.  It may not be needed, but if it is, need reasons for MRI documented in an ov note.  She verbalized understanding of same, sts. is at [facility] now to get knot on the right side of her neck checked out./fim

## 2018-09-10 ENCOUNTER — Encounter

## 2018-09-10 ENCOUNTER — Other Ambulatory Visit: Payer: Self-pay | Admitting: Neurology

## 2018-09-10 ENCOUNTER — Ambulatory Visit (INDEPENDENT_AMBULATORY_CARE_PROVIDER_SITE_OTHER): Payer: Medicare Other | Admitting: Neurology

## 2018-09-10 ENCOUNTER — Encounter: Payer: Self-pay | Admitting: Neurology

## 2018-09-10 VITALS — BP 159/90 | HR 105 | Resp 20 | Ht 73.0 in | Wt 273.0 lb

## 2018-09-10 DIAGNOSIS — R269 Unspecified abnormalities of gait and mobility: Secondary | ICD-10-CM | POA: Diagnosis not present

## 2018-09-10 DIAGNOSIS — E1142 Type 2 diabetes mellitus with diabetic polyneuropathy: Secondary | ICD-10-CM

## 2018-09-10 DIAGNOSIS — R208 Other disturbances of skin sensation: Secondary | ICD-10-CM | POA: Diagnosis not present

## 2018-09-10 DIAGNOSIS — G35 Multiple sclerosis: Secondary | ICD-10-CM | POA: Diagnosis not present

## 2018-09-10 DIAGNOSIS — F329 Major depressive disorder, single episode, unspecified: Secondary | ICD-10-CM

## 2018-09-10 DIAGNOSIS — M542 Cervicalgia: Secondary | ICD-10-CM

## 2018-09-10 DIAGNOSIS — F32A Depression, unspecified: Secondary | ICD-10-CM

## 2018-09-10 MED ORDER — BACLOFEN 20 MG PO TABS: 20.0000 mg | ORAL_TABLET | Freq: Three times a day (TID) | ORAL | 11 refills | Status: DC

## 2018-09-10 MED ORDER — HYDROCODONE-ACETAMINOPHEN 10-325 MG PO TABS: ORAL_TABLET | ORAL | 0 refills | Status: DC

## 2018-09-10 MED ORDER — GABAPENTIN 800 MG PO TABS: 800.0000 mg | ORAL_TABLET | Freq: Three times a day (TID) | ORAL | 11 refills | Status: DC

## 2018-09-10 NOTE — Progress Notes (Signed)
[facility]  PATIENT: Angela Solomon DOB: 09-16-1976  REFERRING CLINICIAN: Dr. Rita Ohara  HISTORY FROM: patient REASON FOR VISIT: MS   HISTORICAL  CHIEF COMPLAINT:  Chief Complaint  Patient presents with  . Multiple Sclerosis    Sts. she has been our ot Avonex for 2 mos. Sts. someone from [facility] called her and told her Medicaid/Medicare would not pay for it/fim    HISTORY OF PRESENT ILLNESS:   Angela Solomon is a 42 year old woman with relapsing remitting multiple sclerosis.   Update 09/10/2018: She is on Avonex and has tolerated it well.   Last Avonex was 2 months ago due to difficulty getting the medication.    She did better with Avonex than Reif due to side effects and copaxone caused an allergic reaction one shot (within minutes).     She has had a lot of neck pain the past couple months.  Pain was worse in the right neck and radiated to bilateral occiput and shoulders.   Her right SCM muscle was protruding and she had stiffness in her neck.   She went to urgent care. She was diagnosed with spasmodic torticollis.     She was prescribed baclofen 20 mg prn and given a shot of toradol with some benefit.      Flexeril had not helped.     She is upset about the long wait recently for her urine drug test and ultimately having to leave and not getting script.          Update 05/07/2018:   She feels her MS is mostly stable.   She is on Avonex and she tolerates it well.       She fell on the front porch steps and landed on her left knee and elbow.    She fell a few other times the last month also.     She feels the right leg is weaker than her left.   She feels her bladder is ok with some frequency.   She wears pads due to some leakage at times.   She feels her vision is about the same.   Her IOP was mildly elevated but was reportedly better at the last visit.   She reports no diabetic changes in eyes at last exam.    She is sleeping better now than she did in the  past.   Trazodone and amitriptyline at bedtime has helped her sleep.  Amitriptyline also helps her dysesthetic pain.    She is 2 x hydrocodones daily.   She has right leg pain and has a lipoma    Update 11/07/2017:    She is on Avonex.   She tolerates it well.   No definite exacerbations.    She denies any change in gait or numbness/weakness.   Bladder function is fine.    Vision is fine.  The MRI of the brain 04/24/2016 showed lesions consistent with MS. When compared to the 2014 MRI there were no changes. MRI of the cervical spine 04/24/2016 showed multiple plaques in the cervical spinal cord consistent with MS. When compared to the MRI dated 05/20/2014, there was no interval change.  Her worse pain is in the right leg.   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  She has a f/u visit in December.   She has a dislocation in her left toes and sees podiatry (Dr. Gean Quint).      For her back, she is on Flexeril tid, amitriptyline gabapentin 800  mg po qid and hydrocodone bid to tid.      From 07/04/2017:  MS:   She has been stable on Avonex as she tolerates it well. She has stopped and started the medication several times due to difficulties with insurance and ran out a couple months ago. We are working with her to try to get this reinstated again.  Gait/strength:   Feels her gait is poor able to walk without a walker now. Gait worsened after her back surgery and she continues to report some weakness. 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   She has diabetes and has a superimposed diabetic polyneuropathy with her radicular symptoms.   She notes some benefit with amitriptyline and gabapentin.     Bladder:   She notes some urinary frequency and urgency that has not had incontinence.  Vision: She denies any MS related vision problems.    Mood/Cognition:      She has had depression and  anxiety. Depression worsened around the time she dated her back operated on. She only feels she gets a little bit of benefit from medications.    She feels her short-term memory is poor at times but notes no major cognitive problems.  Fatigue/sleep:   She notes physical morbid cognitive fatigue. Sleep is poor. She is having difficulty both with sleep onset and sleep maintenance insomnia. She feels the amitriptyline and gabapentin have helped her a little bit..   Lower Back Pain: In July 2016 she had surgery for a lumbar radiculopathy (Dr. Hal Neer).   She had a large disc herniation at L4-L5.     Pain improved after surgery but she notes some continued LBP and feels ROM is worse.    Neck Pain/shoulder pain:   She continues to have a lot of neck pain and left greater than right shoulder pain. She has seen orthopedics about the shoulder and has had injections.  ---------------------------------------  MS History:   She presented in 2003 with gait ataxia, slurred speech and right sided paresthesias. Imaging studies were performed and she was diagnosed with multiple sclerosis. Initially, she was placed on Rebif but had skin reactions and switched to Copaxone. On Copaxone, she had an immediate hypersensitivity response and it was discontinued. She has been on Avonex most of the time but has been off for months at a time when she has had insurance difficulties.      Review of MRI's: 1. MRI of the cervical spine dated 05/20/2014. It showed enhancing spinal cord lesions consistent with MS. C2-C3 there was a left lateral focus. Also at C2-C3 there was a posterior focus. At C3-C4 there was a right posterior lateral focus. At C4 there was a dorsal focus and at C5 there was another dorsal focus. 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 There are multilevel degenerative changes most pronounced at C7-T1 where there could be dynamic impingement of the right C8 nerve root.  2. MRI of the brain dated 09/28/2013  showing diffuse white matter foci in the periventricular and deep white matter consistent with [place].  Although there were no enhancing foci, when compared to a previous MRI dated 10/11/2010, there was some progression.  3. An MRI of the lumbar spine dated 04/27/2015 showed a large disc extrusion with sequestered fragment causing bilateral L5 nerve root compression.   4. MRI  of the brain with and without contrast dated 10/16/2008 showed enhancing lesions.  She was reportedly off medication at that time.   REVIEW OF SYSTEMS:  Constitutional: No fevers, chills, sweats, or change in appetite.   Fatigue, poor sleep Eyes: No visual changes, double vision, eye pain Ear, nose and throat: No hearing loss, ear pain, nasal congestion, sore throat Cardiovascular: No chest pain, palpitations Respiratory:  No shortness of breath at rest or with exertion.   No wheezes GastrointestinaI: No nausea, vomiting, diarrhea, abdominal pain, fecal incontinence Genitourinary:  No dysuria, urinary retention or frequency.  No nocturia. Musculoskeletal:  as above. Integumentary: No rash, pruritus, skin lesions Neurological: as above Psychiatric: No depression at this time.  No anxiety Endocrine: No palpitations, diaphoresis, change in appetite, change in weigh or increased thirst Hematologic/Lymphatic:  No anemia, purpura, petechiae. Allergic/Immunologic: No itchy/runny eyes, nasal congestion, recent allergic reactions, rashes  ALLERGIES: Allergies  Allergen Reactions  . Atorvastatin Other (See Comments)    Myalgia Myalgia  . Glatiramer Anaphylaxis    Generic Copaxone  . Lisinopril Anaphylaxis  . Niacin Diarrhea  . Valproic Acid Anaphylaxis  . Buprenorphine Hcl Hives  . Morphine And Related Hives  . Aspirin Nausea And Vomiting    Fevers and GI upset  . Hydrocodone-Acetaminophen Nausea And Vomiting    Pt states can take Hydrocodone but has a reaction to a additive in vicodin  . Zithromax [Azithromycin] Rash     HOME MEDICATIONS: Outpatient Medications Prior to Visit  Medication Sig Dispense Refill  . acetaminophen (TYLENOL) 500 MG tablet Take 1,000 mg by mouth every 6 (six) hours as needed for mild pain or moderate pain.    Marland Kitchen albuterol (PROVENTIL HFA;VENTOLIN HFA) 108 (90 BASE) MCG/ACT inhaler Inhale 1 puff into the lungs as needed for wheezing or shortness of breath. Reported on 04/06/2016    . amitriptyline (ELAVIL) 75 MG tablet TAKE ONE TABLET BY MOUTH EVERY NIGHT AT BEDTIME 30 tablet 11  . B-D INS SYR ULTRAFINE 1CC/31G 31G X 5/16" 1 ML MISC See admin instructions.  0  . carvedilol (COREG) 25 MG tablet Take 25 mg by mouth 2 (two) times daily.     . cyclobenzaprine (FLEXERIL) 10 MG tablet Take 1 tablet (10 mg total) 3 (three) times daily as needed by mouth for muscle spasms. 90 tablet 11  . empagliflozin (JARDIANCE) 10 MG TABS tablet Take by mouth.    . ferrous sulfate 325 (65 FE) MG tablet Take 325 mg by mouth daily.     . fluconazole (DIFLUCAN) 150 MG tablet TK 1 T PO  NOW AND IF NOT BETTER REPEAT IN 3 DAYS  0  . fluticasone (FLONASE) 50 MCG/ACT nasal spray Place 2 sprays into both nostrils as needed for allergies.     Marland Kitchen insulin glargine (LANTUS) 100 UNIT/ML injection Inject into the skin at bedtime.    Marland Kitchen losartan-hydrochlorothiazide (HYZAAR) 100-25 MG per tablet Take 1 tablet by mouth daily.     . pantoprazole (PROTONIX) 40 MG tablet Take 40 mg by mouth at bedtime.     . promethazine (PHENERGAN) 25 MG tablet Take 1 tablet (25 mg total) by mouth every 6 (six) hours as needed for nausea or vomiting. 30 tablet 1  . temazepam (RESTORIL) 15 MG capsule TAKE 1 CAPSULE BY MOUTH EVERY NIGHT AT BEDTIME AS NEEDED FOR SLEEP 30 capsule 0  . traZODone (DESYREL) 100 MG tablet Take 1 tablet (100 mg total) by mouth at bedtime. 30 tablet 3  . baclofen (LIORESAL) 20  MG tablet   0  . gabapentin (NEURONTIN) 400 MG capsule Take 2 capsules (800 mg total) by mouth 4 (four) times daily. 240 capsule 11  . amLODipine  (NORVASC) 5 MG tablet Take 5 mg by mouth.    . interferon beta-1a (AVONEX) 30 MCG injection Inject 30 mcg into the muscle every 7 (seven) days. (Patient not taking: Reported on 09/10/2018) 12 each 3  . DULoxetine (CYMBALTA) 60 MG capsule Take 1 capsule (60 mg total) by mouth daily. 30 capsule 11  . HYDROcodone-acetaminophen (NORCO) 10-325 MG tablet One po bid prn (Patient not taking: Reported on 09/10/2018) 60 tablet 0   No facility-administered medications prior to visit.     PAST MEDICAL HISTORY: Past Medical History:  Diagnosis Date  . Anemia   . Depression   . Diabetes mellitus without complication (HCC)    dx at age 80  type 25  . GERD (gastroesophageal reflux disease)   . Headache   . Hypertension   . Multiple sclerosis ([place])   . Neuromuscular disorder ([place])    dx 2003 with MS  . Neuropathy   . PONV (postoperative nausea and vomiting)    sometimes...........  Marland Kitchen Vision abnormalities     PAST SURGICAL HISTORY: Past Surgical History:  Procedure Laterality Date  . CHOLECYSTECTOMY    . DENTAL SURGERY    . LUMBAR LAMINECTOMY WITH COFLEX 1 LEVEL Bilateral 06/29/2015   Procedure: LUMBAR LAMINECTOMY WITH COFLEX 1 LEVEL;  Surgeon: Karie Chimera, MD;  Location: [facility];  Service: Neurosurgery;  Laterality: Bilateral;  LUMBAR LAMINECTOMY WITH COFLEX 1 LEVEL  . right foot surgery     remove a piece of glass  (had c/o for about 1 yr)  . TONSILLECTOMY    . TUBAL LIGATION    . WOUND EXPLORATION N/A 07/22/2015   Procedure: WOUND EXPLORATION;  Surgeon: Karie Chimera, MD;  Location: [facility];  Service: Neurosurgery;  Laterality: N/A;    FAMILY HISTORY: Family History  Problem Relation Age of Onset  . Hypertension Mother   . Diabetes Mother   . Stroke Mother   . Heart attack Mother   . Peripheral vascular disease Mother     SOCIAL HISTORY:  Social History   Socioeconomic History  . Marital status: Married    Spouse name: Not on file  . Number of children: Not on file    . Years of education: Not on file  . Highest education level: Not on file  Occupational History  . Not on file  Social Needs  . Financial resource strain: Not on file  . Food insecurity:    Worry: Not on file    Inability: Not on file  . Transportation needs:    Medical: Not on file    Non-medical: Not on file  Tobacco Use  . Smoking status: Current Every Day Smoker    Packs/day: 1.00    Years: 24.00    Pack years: 24.00    Types: Cigarettes  . Smokeless tobacco: Never Used  Substance and Sexual Activity  . Alcohol use: Yes    Alcohol/week: 0.0 standard drinks    Comment: occasional  . Drug use: No  . Sexual activity: Not on file  Lifestyle  . Physical activity:    Days per week: Not on file    Minutes per session: Not on file  . Stress: Not on file  Relationships  . Social connections:    Talks on phone: Not on file    Gets  together: Not on file    Attends religious service: Not on file    Active member of club or organization: Not on file    Attends meetings of clubs or organizations: Not on file    Relationship status: Not on file  . Intimate partner violence:    Fear of current or ex partner: Not on file    Emotionally abused: Not on file    Physically abused: Not on file    Forced sexual activity: Not on file  Other Topics Concern  . Not on file  Social History Narrative  . Not on file     PHYSICAL EXAM  Vitals:   09/10/18 1302  BP: (!) 159/90  Pulse: (!) 105  Resp: 20  Weight: 273 lb (123.8 kg)  Height: 6\' 1"  (1.854 m)    Body mass index is 36.02 kg/m.   General: The patient is well-developed and well-nourished and in no acute distress   Lower Back: She has some tenderness in the lower lumbar paraspinal muscles.  Neurologic Exam  Mental status: She is alert and oriented. The affect is normal.. She has no difficulty with speech. Focus is mildly reduced short-term memory seems appropriate.  Cranial nerves: Extraocular movements are full.   Facial strength and sensation is normal.  Trapezius strength is normal.  . Palatal elevation of protrusion is midline. Hearing is normal and symmetric.    Motor:  Muscle bulk and tone are normal. Strength was normal in the right arm and she had slight weakness 4+/5 in the ulnar innervated muscles of the left. Strength is 4+/5 in the toes. The left great toe has been surgically removed   Sensory: There is reduced sensation in both feet, worse in the right L5  distribution  Coordination: Cerebellar testing reveals good finger-nose-finger but poor heel-to-shin bilaterally.  Gait and station: Station is wide.  Her gait is arthritic and mildly wide.  The tandem gait is poor.  Romberg is borderline.   Reflexes: Deep tendon reflexes are symmetric in her arms and legs (trace ankle).Marland Kitchen     DIAGNOSTIC DATA (LABS, IMAGING, TESTING) - I reviewed patient records, labs, notes, testing and imaging myself where available.     ASSESSMENT AND PLAN   Multiple sclerosis ([place])  Diabetic polyneuropathy associated with type 2 diabetes mellitus (HCC)  Dysesthesia  Gait disturbance  Neck pain  Depression, unspecified depression type    1.  Continue Avonex 30 g weekly.  We will see if we can get her back on the medication as there has been some trouble getting Medicaid to cover it. 2.   For pain, continue hydrocodone up to twice a day and gabapentin and flexeril.  Renew hydrocodone.  She understands she will need to do intermittent drug testing. 3.   Use a cane or walker for safety.  4.   Her neck pain is doing better than a couple weeks ago and she no longer has a protruding sternocleidomastoid muscle.  We discussed that if her pain significantly worsens or she gets rotation of the neck I would consider Botox.  Otherwise, she should continue baclofen up to 3 times a day  Rtc 4-5 months or sooner if new or worsening neurologic symptoms.Nanine Means. Felecia Shelling, MD, PhD 9/56/3875, 64:33 PM Certified in  Neurology, Clinical Neurophysiology, Sleep Medicine, Pain Medicine and Neuroimaging  [facility] [address] [place], [place] [address] [phone]

## 2018-10-03 ENCOUNTER — Other Ambulatory Visit: Payer: Self-pay | Admitting: Neurology

## 2018-10-04 ENCOUNTER — Other Ambulatory Visit: Payer: Self-pay | Admitting: Neurology

## 2018-10-08 ENCOUNTER — Telehealth: Payer: Self-pay | Admitting: Neurology

## 2018-10-08 MED ORDER — HYDROCODONE-ACETAMINOPHEN 10-325 MG PO TABS: ORAL_TABLET | ORAL | 0 refills | Status: DC

## 2018-10-08 NOTE — Telephone Encounter (Signed)
Pt requesting a refill for HYDROcodone-acetaminophen (NORCO) 10-325 MG tablet °

## 2018-10-08 NOTE — Telephone Encounter (Signed)
Rx. up front [facility]/fim 

## 2018-10-08 NOTE — Telephone Encounter (Signed)
Rx. awaiting [name] sig/fim 

## 2018-10-09 ENCOUNTER — Telehealth: Payer: Self-pay | Admitting: Neurology

## 2018-10-09 MED ORDER — TEMAZEPAM 15 MG PO CAPS: 15.0000 mg | ORAL_CAPSULE | Freq: Every evening | ORAL | 0 refills | Status: DC | PRN

## 2018-10-09 NOTE — Telephone Encounter (Signed)
Pt is asking for a refill prescription on her temazepam (RESTORIL) 15 MG capsule

## 2018-10-09 NOTE — Telephone Encounter (Signed)
Temazepam escribed to [facility]/fim

## 2018-10-09 NOTE — Addendum Note (Signed)
Addended by: France Ravens I on: 10/09/2018 05:09 PM   Modules accepted: Orders

## 2018-10-10 ENCOUNTER — Telehealth: Payer: Self-pay | Admitting: Neurology

## 2018-10-10 MED ORDER — TRAZODONE HCL 100 MG PO TABS: 100.0000 mg | ORAL_TABLET | Freq: Every day | ORAL | 0 refills | Status: DC

## 2018-10-10 NOTE — Telephone Encounter (Signed)
Patient requesting refill of trazodone. Best call back [phone]

## 2018-10-10 NOTE — Telephone Encounter (Signed)
[facility]odone escribed to [facility]

## 2018-11-06 ENCOUNTER — Telehealth: Payer: Self-pay | Admitting: Neurology

## 2018-11-06 MED ORDER — HYDROCODONE-ACETAMINOPHEN 10-325 MG PO TABS: ORAL_TABLET | ORAL | 0 refills | Status: DC

## 2018-11-06 MED ORDER — TEMAZEPAM 15 MG PO CAPS: 15.0000 mg | ORAL_CAPSULE | Freq: Every evening | ORAL | 0 refills | Status: DC | PRN

## 2018-11-06 NOTE — Telephone Encounter (Signed)
Pt calling for a refill on temazepam (RESTORIL) 15 MG capsule [id number] [place], [place] [address]

## 2018-11-06 NOTE — Addendum Note (Signed)
Addended by: [name] on: 11/06/2018 09:04 AM   Modules accepted: Orders

## 2018-11-06 NOTE — Addendum Note (Signed)
Addended by: [name] on: 11/06/2018 09:03 AM   Modules accepted: Orders

## 2018-11-06 NOTE — Telephone Encounter (Signed)
Drug registry checked. She last refilled 10/10/18 #60, not receiving from other MD's. Last seen 09/10/18 and next f/u 03/11/19. Printed rx, waiting on [place] since Dr. Felecia Shelling out of the office.

## 2018-11-06 NOTE — Telephone Encounter (Signed)
Placed printed/signed rx up front for pick up.

## 2018-11-06 NOTE — Telephone Encounter (Signed)
Pt is asking for a refill prescription for her HYDROcodone-acetaminophen (NORCO) 10-325 MG tablet

## 2018-11-09 ENCOUNTER — Other Ambulatory Visit: Payer: Self-pay | Admitting: Neurology

## 2018-11-14 MED ORDER — TEMAZEPAM 15 MG PO CAPS: 15.0000 mg | ORAL_CAPSULE | Freq: Every evening | ORAL | 0 refills | Status: DC | PRN

## 2018-11-14 MED ORDER — TRAZODONE HCL 100 MG PO TABS: 100.0000 mg | ORAL_TABLET | Freq: Every day | ORAL | 0 refills | Status: DC

## 2018-11-14 NOTE — Telephone Encounter (Signed)
Faxed printed/signed rx to pharmacy. Received fax confirmation.

## 2018-11-14 NOTE — Telephone Encounter (Addendum)
Pt called stating medication is needing to go to [facility] on [address]. Also stating she requested refills for traZODone (DESYREL) 100 MG tablet

## 2018-11-14 NOTE — Addendum Note (Signed)
Addended by: [name] on: 11/14/2018 08:43 AM   Modules accepted: Orders

## 2018-11-14 NOTE — Telephone Encounter (Signed)
[facility] and spoke with Autumn. Cx rx temazepam on file. Advised pt requesting rx to go to different pharmacy.   Rx printed, waiting on MD signature.

## 2018-12-05 ENCOUNTER — Telehealth: Payer: Self-pay | Admitting: Neurology

## 2018-12-05 ENCOUNTER — Other Ambulatory Visit: Payer: Self-pay | Admitting: Neurology

## 2018-12-05 MED ORDER — HYDROCODONE-ACETAMINOPHEN 10-325 MG PO TABS: ORAL_TABLET | ORAL | 0 refills | Status: DC

## 2018-12-05 MED ORDER — TRAZODONE HCL 100 MG PO TABS: 100.0000 mg | ORAL_TABLET | Freq: Every day | ORAL | 0 refills | Status: DC

## 2018-12-05 MED ORDER — TEMAZEPAM 15 MG PO CAPS: 15.0000 mg | ORAL_CAPSULE | Freq: Every evening | ORAL | 0 refills | Status: DC | PRN

## 2018-12-05 NOTE — Telephone Encounter (Signed)
Hydrocdone rx. awaiting [name] sig/fim

## 2018-12-05 NOTE — Addendum Note (Signed)
Addended by: France Ravens I on: 12/05/2018 04:48 PM   Modules accepted: Orders

## 2018-12-05 NOTE — Telephone Encounter (Signed)
Hydrocodone rx. up front [facility]. Temazepam rx. faxed to [facility]/fim

## 2018-12-05 NOTE — Telephone Encounter (Signed)
Temazepam awaiting [name] sig. Trazodone escribed to [facility]

## 2018-12-05 NOTE — Telephone Encounter (Signed)
Pt requesting refills for HYDROcodone-acetaminophen (NORCO) 10-325 MG tablet aware the office will be closing at noon tomorrow  And for temazepam (RESTORIL) 15 MG capsule and traZODone (DESYREL) 100 MG tablet sent to [facility] on [address]

## 2018-12-19 ENCOUNTER — Other Ambulatory Visit: Payer: Self-pay | Admitting: Neurology

## 2019-01-07 ENCOUNTER — Other Ambulatory Visit: Payer: Self-pay | Admitting: Neurology

## 2019-01-07 NOTE — Telephone Encounter (Signed)
Pt has called for refill of her HYDROcodone-acetaminophen ([place]) 10-325 MG tablet  [facility] [phone]

## 2019-01-08 MED ORDER — HYDROCODONE-ACETAMINOPHEN 10-325 MG PO TABS: ORAL_TABLET | ORAL | 0 refills | Status: DC

## 2019-01-08 NOTE — Addendum Note (Signed)
Addended by: [name] on: 01/08/2019 08:09 AM   Modules accepted: Orders

## 2019-01-28 ENCOUNTER — Other Ambulatory Visit: Payer: Self-pay | Admitting: Neurology

## 2019-01-28 MED ORDER — TRAZODONE HCL 100 MG PO TABS: 100.0000 mg | ORAL_TABLET | Freq: Every day | ORAL | 0 refills | Status: DC

## 2019-01-28 MED ORDER — TEMAZEPAM 15 MG PO CAPS: 15.0000 mg | ORAL_CAPSULE | Freq: Every evening | ORAL | 0 refills | Status: DC | PRN

## 2019-01-28 NOTE — Telephone Encounter (Signed)
Pt has called back to inform RN Terrence Dupont that she has her last prescription bottle dated 12-19-2018 for her    temazepam (RESTORIL) 15 MG capsule  and it is in a [facility] bottle.  Pt is asking to be called once RN has received a response from Dr Felecia Shelling

## 2019-01-28 NOTE — Telephone Encounter (Signed)
I checked drug registry. She last refilled temazepam 12/19/18 #30, not receiving from other Md's. Last seen 09/10/18 and next f/u 03/11/19  Rx trazodone last sent 12/05/18. She is due for refill.

## 2019-01-28 NOTE — Telephone Encounter (Signed)
Pt request refill for traZODone (DESYREL) 100 MG tablet and temazepam (RESTORIL) 15 MG capsule sent to [facility]

## 2019-01-29 ENCOUNTER — Telehealth: Payer: Self-pay | Admitting: *Deleted

## 2019-01-29 NOTE — Telephone Encounter (Signed)
Submitted PA temazepam on covermymeds. Key: [id number]. Waiting on determination.  "[facility] has received your information, and the request will be reviewed.  If you have any questions please contact [facility] at [phone]."

## 2019-02-05 ENCOUNTER — Other Ambulatory Visit: Payer: Self-pay | Admitting: Neurology

## 2019-02-05 MED ORDER — HYDROCODONE-ACETAMINOPHEN 10-325 MG PO TABS: ORAL_TABLET | ORAL | 0 refills | Status: DC

## 2019-02-05 NOTE — Telephone Encounter (Signed)
Pt has called for a refill on her HYDROcodone-acetaminophen (NORCO) 10-325 MG tablet [id number]  

## 2019-02-05 NOTE — Addendum Note (Signed)
Addended by: [name] on: 02/05/2019 04:20 PM   Modules accepted: Orders

## 2019-02-11 DIAGNOSIS — D219 Benign neoplasm of connective and other soft tissue, unspecified: Secondary | ICD-10-CM | POA: Insufficient documentation

## 2019-02-11 DIAGNOSIS — N920 Excessive and frequent menstruation with regular cycle: Secondary | ICD-10-CM | POA: Insufficient documentation

## 2019-02-11 DIAGNOSIS — D500 Iron deficiency anemia secondary to blood loss (chronic): Secondary | ICD-10-CM | POA: Insufficient documentation

## 2019-02-26 ENCOUNTER — Other Ambulatory Visit: Payer: Self-pay | Admitting: Neurology

## 2019-03-02 ENCOUNTER — Other Ambulatory Visit: Payer: Self-pay | Admitting: Neurology

## 2019-03-03 ENCOUNTER — Other Ambulatory Visit: Payer: Self-pay | Admitting: Neurology

## 2019-03-03 MED ORDER — TEMAZEPAM 15 MG PO CAPS: 15.0000 mg | ORAL_CAPSULE | Freq: Every evening | ORAL | 0 refills | Status: DC | PRN

## 2019-03-03 NOTE — Telephone Encounter (Signed)
Pt is needing a refill on her temazepam (RESTORIL) 15 MG capsule sent to [facility] on [address]

## 2019-03-06 ENCOUNTER — Other Ambulatory Visit: Payer: Self-pay | Admitting: Neurology

## 2019-03-06 MED ORDER — HYDROCODONE-ACETAMINOPHEN 10-325 MG PO TABS: ORAL_TABLET | ORAL | 0 refills | Status: DC

## 2019-03-06 NOTE — Telephone Encounter (Signed)
Pt is calling for a refill on her HYDROcodone-acetaminophen (NORCO) 10-325 MG tablet [facility] [phone]

## 2019-03-06 NOTE — Addendum Note (Signed)
Addended by: [name] on: 03/06/2019 12:53 PM   Modules accepted: Orders

## 2019-03-11 ENCOUNTER — Other Ambulatory Visit: Payer: Self-pay

## 2019-03-11 ENCOUNTER — Encounter: Payer: Self-pay | Admitting: Neurology

## 2019-03-11 ENCOUNTER — Ambulatory Visit (INDEPENDENT_AMBULATORY_CARE_PROVIDER_SITE_OTHER): Payer: Medicare Other | Admitting: Neurology

## 2019-03-11 VITALS — BP 135/85 | HR 90 | Ht 72.0 in | Wt 273.0 lb

## 2019-03-11 DIAGNOSIS — Z79899 Other long term (current) drug therapy: Secondary | ICD-10-CM | POA: Diagnosis not present

## 2019-03-11 DIAGNOSIS — G35 Multiple sclerosis: Secondary | ICD-10-CM

## 2019-03-11 DIAGNOSIS — R269 Unspecified abnormalities of gait and mobility: Secondary | ICD-10-CM

## 2019-03-11 DIAGNOSIS — E1142 Type 2 diabetes mellitus with diabetic polyneuropathy: Secondary | ICD-10-CM

## 2019-03-11 MED ORDER — INTERFERON BETA-1A 30 MCG IM KIT: 30.0000 ug | PACK | INTRAMUSCULAR | 3 refills | Status: DC

## 2019-03-11 NOTE — Progress Notes (Signed)
[facility]  PATIENT: Angela Solomon DOB: 1976/09/01  REFERRING CLINICIAN: Dr. Rita Ohara  HISTORY FROM: patient REASON FOR VISIT: MS   HISTORICAL  CHIEF COMPLAINT:  Chief Complaint  Patient presents with  . Follow-up    RM 12, alone. Last seen 09/10/18  . Multiple Sclerosis    Has been off Avonex since before her last appt. States insurance would not cover. No problems/new sx since being off medication.    HISTORY OF PRESENT ILLNESS:   Angela Solomon is a 43 y.o. woman with relapsing remitting multiple sclerosis.   Update 03/11/2019: She is off of the Avonex again.    She has had trouble getting coverage.   She had trouble tolerating Rebif and copaxone in the past.   She has not been on any of the pills.    She feels she has been mostly stable.      Gait is doing about the same with a limp due to right > left weakness.   No falls.    No hand clumsiness.   She notes some numbness in her feet.      Bladder function is the sam.   Vision is doing ok.   Her DM is doing better and last HgBA1c is 7.7.    She has neck pain doing about the same.      Baclofen and hydrocodone has helped her some.       Update 09/10/2018: She is on Avonex and has tolerated it well.   Last Avonex was 2 months ago due to difficulty getting the medication.    She did better with Avonex than Reif due to side effects and copaxone caused an allergic reaction one shot (within minutes).     She has had a lot of neck pain the past couple months.  Pain was worse in the right neck and radiated to bilateral occiput and shoulders.   Her right SCM muscle was protruding and she had stiffness in her neck.   She went to urgent care. She was diagnosed with spasmodic torticollis.     She was prescribed baclofen 20 mg prn and given a shot of toradol with some benefit.      Flexeril had not helped.     She is upset about the long wait recently for her urine drug test and ultimately having to leave and not  getting script.          Update 05/07/2018:   She feels her MS is mostly stable.   She is on Avonex and she tolerates it well.       She fell on the front porch steps and landed on her left knee and elbow.    She fell a few other times the last month also.     She feels the right leg is weaker than her left.   She feels her bladder is ok with some frequency.   She wears pads due to some leakage at times.   She feels her vision is about the same.   Her IOP was mildly elevated but was reportedly better at the last visit.   She reports no diabetic changes in eyes at last exam.    She is sleeping better now than she did in the past.   Trazodone and amitriptyline at bedtime has helped her sleep.  Amitriptyline also helps her dysesthetic pain.    She is 2 x hydrocodones daily.   She has right leg pain and has a  lipoma    Update 11/07/2017:    She is on Avonex.   She tolerates it well.   No definite exacerbations.    She denies any change in gait or numbness/weakness.   Bladder function is fine.    Vision is fine.  The MRI of the brain 04/24/2016 showed lesions consistent with MS. When compared to the 2014 MRI there were no changes. MRI of the cervical spine 04/24/2016 showed multiple plaques in the cervical spinal cord consistent with MS. When compared to the MRI dated 05/20/2014, there was no interval change.  Her worse pain is in the right leg.   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  She has a f/u visit in December.   She has a dislocation in her left toes and sees podiatry (Dr. Gean Quint).      For her back, she is on Flexeril tid, amitriptyline gabapentin 800 mg po qid and hydrocodone bid to tid.      From 07/04/2017:  MS:   She has been stable on Avonex as she tolerates it well. She has stopped and started the medication several times due to difficulties with insurance and ran out a couple months ago. We are working with her to try to get this reinstated again.  Gait/strength:   Feels her gait is poor able  to walk without a walker now. Gait worsened after her back surgery and she continues to report some weakness. 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   She has diabetes and has a superimposed diabetic polyneuropathy with her radicular symptoms.   She notes some benefit with amitriptyline and gabapentin.     Bladder:   She notes some urinary frequency and urgency that has not had incontinence.  Vision: She denies any MS related vision problems.    Mood/Cognition:      She has had depression and anxiety. Depression worsened around the time she dated her back operated on. She only feels she gets a little bit of benefit from medications.    She feels her short-term memory is poor at times but notes no major cognitive problems.  Fatigue/sleep:   She notes physical morbid cognitive fatigue. Sleep is poor. She is having difficulty both with sleep onset and sleep maintenance insomnia. She feels the amitriptyline and gabapentin have helped her a little bit..   Lower Back Pain: In July 2016 she had surgery for a lumbar radiculopathy (Dr. Hal Neer).   She had a large disc herniation at L4-L5.     Pain improved after surgery but she notes some continued LBP and feels ROM is worse.    Neck Pain/shoulder pain:   She continues to have a lot of neck pain and left greater than right shoulder pain. She has seen orthopedics about the shoulder and has had injections.  ---------------------------------------  MS History:   She presented in 2003 with gait ataxia, slurred speech and right sided paresthesias. Imaging studies were performed and she was diagnosed with multiple sclerosis. Initially, she was placed on Rebif but had skin reactions and switched to Copaxone. On Copaxone, she had an immediate hypersensitivity response and it was discontinued. She has been on Avonex most of the time but has  been off for months at a time when she has had insurance difficulties.      Review of MRI's: 1.  MRI of the cervical spine dated 05/20/2014. It showed enhancing spinal cord lesions consistent with MS. C2-C3 there was a left lateral focus. Also at C2-C3 there was a posterior focus. At C3-C4 there was a right posterior lateral focus. At C4 there was a dorsal focus and at C5 there was another dorsal focus. 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 There are multilevel degenerative changes most pronounced at C7-T1 where there could be dynamic impingement of the right C8 nerve root.  2. MRI of the brain dated 09/28/2013 showing diffuse white matter foci in the periventricular and deep white matter consistent with [place].  Although there were no enhancing foci, when compared to a previous MRI dated 10/11/2010, there was some progression.  3. An MRI of the lumbar spine dated 04/27/2015 showed a large disc extrusion with sequestered fragment causing bilateral L5 nerve root compression.   4. MRI of the brain with and without contrast dated 10/16/2008 showed enhancing lesions.  She was reportedly off medication at that time.   REVIEW OF SYSTEMS:  Constitutional: No fevers, chills, sweats, or change in appetite.   Fatigue, poor sleep Eyes: No visual changes, double vision, eye pain Ear, nose and throat: No hearing loss, ear pain, nasal congestion, sore throat Cardiovascular: No chest pain, palpitations Respiratory:  No shortness of breath at rest or with exertion.   No wheezes GastrointestinaI: No nausea, vomiting, diarrhea, abdominal pain, fecal incontinence Genitourinary:  No dysuria, urinary retention or frequency.  No nocturia. Musculoskeletal:  as above. Integumentary: No rash, pruritus, skin lesions Neurological: as above Psychiatric: No depression at this time.  No anxiety Endocrine: No palpitations, diaphoresis, change in appetite, change in weigh or increased thirst Hematologic/Lymphatic:  No  anemia, purpura, petechiae. Allergic/Immunologic: No itchy/runny eyes, nasal congestion, recent allergic reactions, rashes  ALLERGIES: Allergies  Allergen Reactions  . Atorvastatin Other (See Comments)    Myalgia Myalgia  . Glatiramer Anaphylaxis    Generic Copaxone  . Lisinopril Anaphylaxis  . Niacin Diarrhea  . Valproic Acid Anaphylaxis  . Buprenorphine Hcl Hives  . Morphine And Related Hives  . Aspirin Nausea And Vomiting    Fevers and GI upset  . Hydrocodone-Acetaminophen Nausea And Vomiting    Pt states can take Hydrocodone but has a reaction to a additive in vicodin  . Zithromax [Azithromycin] Rash    HOME MEDICATIONS: Outpatient Medications Prior to Visit  Medication Sig Dispense Refill  . acetaminophen (TYLENOL) 500 MG tablet Take 1,000 mg by mouth every 6 (six) hours as needed for mild pain or moderate pain.    Marland Kitchen albuterol (PROVENTIL HFA;VENTOLIN HFA) 108 (90 BASE) MCG/ACT inhaler Inhale 1 puff into the lungs as needed for wheezing or shortness of breath. Reported on 04/06/2016    . amitriptyline (ELAVIL) 75 MG tablet TAKE 1 TABLET BY MOUTH EVERY NIGHT AT BEDTIME 30 tablet 5  . B-D INS SYR ULTRAFINE 1CC/31G 31G X 5/16" 1 ML MISC See admin instructions.  0  . baclofen (LIORESAL) 20 MG tablet Take 1 tablet (20 mg total) by mouth 3 (three) times daily. 90 each 11  . carvedilol (COREG) 25 MG tablet Take 25 mg by mouth 2 (two) times daily.     . cyclobenzaprine (FLEXERIL) 10 MG tablet Take 1 tablet (10 mg total) 3 (three) times daily as needed by mouth for muscle spasms. 90 tablet 11  . empagliflozin (JARDIANCE) 10 MG TABS tablet Take by mouth.    . ferrous sulfate 325 (65 FE) MG tablet Take  325 mg by mouth daily.     . fluconazole (DIFLUCAN) 150 MG tablet TK 1 T PO  NOW AND IF NOT BETTER REPEAT IN 3 DAYS  0  . fluticasone (FLONASE) 50 MCG/ACT nasal spray Place 2 sprays into both nostrils as needed for allergies.     Marland Kitchen gabapentin (NEURONTIN) 800 MG tablet Take 1 tablet (800 mg  total) by mouth 3 (three) times daily. 90 tablet 11  . HYDROcodone-acetaminophen (NORCO) 10-325 MG tablet One po bid prn 60 tablet 0  . insulin glargine (LANTUS) 100 UNIT/ML injection Inject into the skin at bedtime.    Marland Kitchen losartan-hydrochlorothiazide (HYZAAR) 100-25 MG per tablet Take 1 tablet by mouth daily.     . pantoprazole (PROTONIX) 40 MG tablet Take 40 mg by mouth at bedtime.     . promethazine (PHENERGAN) 25 MG tablet Take 1 tablet (25 mg total) by mouth every 6 (six) hours as needed for nausea or vomiting. 30 tablet 1  . temazepam (RESTORIL) 15 MG capsule Take 1 capsule (15 mg total) by mouth at bedtime as needed for sleep. 30 capsule 0  . traZODone (DESYREL) 100 MG tablet TAKE 1 TABLET(100 MG) BY MOUTH AT BEDTIME 30 tablet 0  . interferon beta-1a (AVONEX) 30 MCG injection Inject 30 mcg into the muscle every 7 (seven) days. 12 each 3  . amLODipine (NORVASC) 5 MG tablet Take 5 mg by mouth.     No facility-administered medications prior to visit.     PAST MEDICAL HISTORY: Past Medical History:  Diagnosis Date  . Anemia   . Depression   . Diabetes mellitus without complication (HCC)    dx at age 76  type 79  . GERD (gastroesophageal reflux disease)   . Headache   . Hypertension   . Multiple sclerosis ([place])   . Neuromuscular disorder ([place])    dx 2003 with MS  . Neuropathy   . PONV (postoperative nausea and vomiting)    sometimes...........  Marland Kitchen Vision abnormalities     PAST SURGICAL HISTORY: Past Surgical History:  Procedure Laterality Date  . CHOLECYSTECTOMY    . DENTAL SURGERY    . LUMBAR LAMINECTOMY WITH COFLEX 1 LEVEL Bilateral 06/29/2015   Procedure: LUMBAR LAMINECTOMY WITH COFLEX 1 LEVEL;  Surgeon: Karie Chimera, MD;  Location: [facility];  Service: Neurosurgery;  Laterality: Bilateral;  LUMBAR LAMINECTOMY WITH COFLEX 1 LEVEL  . right foot surgery     remove a piece of glass  (had c/o for about 1 yr)  . TONSILLECTOMY    . TUBAL LIGATION    . WOUND EXPLORATION N/A  07/22/2015   Procedure: WOUND EXPLORATION;  Surgeon: Karie Chimera, MD;  Location: [facility];  Service: Neurosurgery;  Laterality: N/A;    FAMILY HISTORY: Family History  Problem Relation Age of Onset  . Hypertension Mother   . Diabetes Mother   . Stroke Mother   . Heart attack Mother   . Peripheral vascular disease Mother     SOCIAL HISTORY:  Social History   Socioeconomic History  . Marital status: Married    Spouse name: Not on file  . Number of children: Not on file  . Years of education: Not on file  . Highest education level: Not on file  Occupational History  . Not on file  Social Needs  . Financial resource strain: Not on file  . Food insecurity:    Worry: Not on file    Inability: Not on file  . Transportation needs:  Medical: Not on file    Non-medical: Not on file  Tobacco Use  . Smoking status: Current Every Day Smoker    Packs/day: 1.00    Years: 24.00    Pack years: 24.00    Types: Cigarettes  . Smokeless tobacco: Never Used  Substance and Sexual Activity  . Alcohol use: Yes    Alcohol/week: 0.0 standard drinks    Comment: occasional  . Drug use: No  . Sexual activity: Not on file  Lifestyle  . Physical activity:    Days per week: Not on file    Minutes per session: Not on file  . Stress: Not on file  Relationships  . Social connections:    Talks on phone: Not on file    Gets together: Not on file    Attends religious service: Not on file    Active member of club or organization: Not on file    Attends meetings of clubs or organizations: Not on file    Relationship status: Not on file  . Intimate partner violence:    Fear of current or ex partner: Not on file    Emotionally abused: Not on file    Physically abused: Not on file    Forced sexual activity: Not on file  Other Topics Concern  . Not on file  Social History Narrative  . Not on file     PHYSICAL EXAM  Vitals:   03/11/19 1310  BP: 135/85  Pulse: 90  Weight: 273 lb  (123.8 kg)  Height: 6' (1.829 m)    Body mass index is 37.03 kg/m.   General: The patient is well-developed and well-nourished and in no acute distress   Lower Back: She has some tenderness in the lower lumbar paraspinal muscles.  Neurologic Exam  Mental status: She is alert and oriented. The affect is normal.. She has no difficulty with speech. Focus is mildly reduced short-term memory seems appropriate.  Cranial nerves: Extraocular movements are full.  Facial strength and sensation is normal.  Trapezius strength is normal.  . Palatal elevation of protrusion is midline. Hearing is normal and symmetric.    Motor:  Muscle bulk and tone are normal. Strength was normal in the right arm and she had slight weakness 4+/5 in the ulnar innervated muscles of the left. Strength is 4+/5 in the toes. The left great toe has been surgically removed   Sensory: There is reduced sensation in both feet, worse in the right L5  distribution  Coordination: Cerebellar testing reveals good finger-nose-finger but poor heel-to-shin bilaterally.  Gait and station: Station is wide.  Her gait is arthritic and mildly wide.  The tandem gait is poor.  Romberg is borderline.   Reflexes: Deep tendon reflexes are symmetric in her arms and legs (trace ankle).Marland Kitchen     DIAGNOSTIC DATA (LABS, IMAGING, TESTING) - I reviewed patient records, labs, notes, testing and imaging myself where available.     ASSESSMENT AND PLAN   Multiple sclerosis ([place]) - Plan: QuantiFERON-TB Gold Plus, CBC with Differential/Platelet, CANCELED: CBC with Differential/Platelet, CANCELED: Hepatic function panel, CANCELED: QuantiFERON-TB Gold Plus, CANCELED: Comprehensive metabolic panel, CANCELED: QuantiFERON-TB Gold Plus, CANCELED: CBC with Differential/Platelet  Diabetic polyneuropathy associated with type 2 diabetes mellitus ([place]) - Plan: CANCELED: CBC with Differential/Platelet, CANCELED: Hepatic function panel  High risk medication use  - Plan: QuantiFERON-TB Gold Plus, CBC with Differential/Platelet, CANCELED: CBC with Differential/Platelet, CANCELED: Hepatic function panel, CANCELED: QuantiFERON-TB Gold Plus, CANCELED: Comprehensive metabolic panel, CANCELED: QuantiFERON-TB Gold Plus, CANCELED:  CBC with Differential/Platelet  Gait disturbance    1.  We will see if we can get her back on Avonex 30 g weekly.   Alternatively, if we cannot get patient assistance we will see if she qualifies for [place]. 2.   Continue hydrocodone.   She reports being scheduled for a hysterectomy --- ok to be on post-op medication but these should not be long term .  She understands she will need to do intermittent drug testing. 3.   Use a cane or walker for safety.  4.   Continue baclofen  Rtc 4-5 months or sooner if new or worsening neurologic symptoms.Nanine Means. Felecia Shelling, MD, PhD 1/61/0960, 4:54 PM Certified in Neurology, Clinical Neurophysiology, Sleep Medicine, Pain Medicine and Neuroimaging  [facility] [address], [place], [place] [address] [phone]

## 2019-03-12 ENCOUNTER — Telehealth: Payer: Self-pay | Admitting: *Deleted

## 2019-03-12 NOTE — Telephone Encounter (Signed)
Received approval letter from [employer]. Effective 03/12/19-12/25/19.

## 2019-03-12 NOTE — Telephone Encounter (Signed)
Called [employer] PA dept, spoke with pharmacist, Netta Corrigan to initiate PA for Avonex.Dx: relapsing remitting MS- G35, failed: rebif, copaxone. On medication since 04/2015, MRI brain stable since 2014.  Avonex was approved, case #[id number]. Office will receive approval letter by fax.

## 2019-03-18 ENCOUNTER — Telehealth: Payer: Self-pay | Admitting: *Deleted

## 2019-03-18 NOTE — Telephone Encounter (Signed)
Spoke to pt and relayed that we received approval for her avonex thru [employer].  She did receive letter in mail.  She stated shea also had medicaid as well.  I told her that will fax enrollment form to avonex and then will keep her informed on progress.  She verbalized understanding.   I faxed enrollment form to [phone].

## 2019-03-18 NOTE — Telephone Encounter (Signed)
Fax confirmation received for Avonex enrollment form [phone].

## 2019-03-19 NOTE — Telephone Encounter (Signed)
Received call from Lamy at [facility] [phone] about pts avonex prefilled syringes 22mcg weekly.  90 day (12) with 3 refills.  Order given.

## 2019-03-31 ENCOUNTER — Other Ambulatory Visit: Payer: Self-pay | Admitting: Neurology

## 2019-04-04 ENCOUNTER — Other Ambulatory Visit: Payer: Self-pay | Admitting: Neurology

## 2019-04-04 NOTE — Telephone Encounter (Signed)
Pt is needing a refill on her HYDROcodone-acetaminophen (NORCO) 10-325 MG tablet and her temazepam (RESTORIL) 15 MG capsule sent to the [facility] on [address]

## 2019-04-07 ENCOUNTER — Other Ambulatory Visit: Payer: Self-pay | Admitting: Neurology

## 2019-04-07 ENCOUNTER — Telehealth: Payer: Self-pay | Admitting: Neurology

## 2019-04-07 MED ORDER — HYDROCODONE-ACETAMINOPHEN 10-325 MG PO TABS: ORAL_TABLET | ORAL | 0 refills | Status: DC

## 2019-04-07 MED ORDER — TEMAZEPAM 15 MG PO CAPS: 15.0000 mg | ORAL_CAPSULE | Freq: Every evening | ORAL | 0 refills | Status: DC | PRN

## 2019-04-07 NOTE — Telephone Encounter (Signed)
04/07/19 - Called patient and scheduled for 07/07/19 at 11:30am

## 2019-04-07 NOTE — Telephone Encounter (Signed)
-----   Message from [name], RN sent at 04/07/2019  8:37 AM EDT ----- Angela Solomon- pt saw Dr. Felecia Shelling on 03/11/19. She has no f/u scheduled. Can you call and schedule her a f/u around 07/11/19?    Thank you:)

## 2019-04-07 NOTE — Telephone Encounter (Signed)
Pt has called for a refill on her HYDROcodone-acetaminophen (NORCO) 10-325 MG tablet  [facility] [phone]

## 2019-04-07 NOTE — Telephone Encounter (Signed)
Already sent request to Dr. Felecia Shelling this am. Pt called this past Friday for refill as well. Refill approval pending by MD

## 2019-05-07 ENCOUNTER — Other Ambulatory Visit: Payer: Self-pay | Admitting: Neurology

## 2019-05-07 MED ORDER — HYDROCODONE-ACETAMINOPHEN 10-325 MG PO TABS: ORAL_TABLET | ORAL | 0 refills | Status: DC

## 2019-05-07 MED ORDER — TEMAZEPAM 15 MG PO CAPS: 15.0000 mg | ORAL_CAPSULE | Freq: Every evening | ORAL | 5 refills | Status: DC | PRN

## 2019-05-07 NOTE — Addendum Note (Signed)
Addended by: Rossie Muskrat L on: 05/07/2019 01:09 PM   Modules accepted: Orders

## 2019-05-07 NOTE — Addendum Note (Signed)
Addended by: Arlice Colt A on: 05/07/2019 03:03 PM   Modules accepted: Orders

## 2019-05-07 NOTE — Addendum Note (Signed)
Addended by: [name] on: 05/07/2019 09:52 AM   Modules accepted: Orders

## 2019-05-07 NOTE — Telephone Encounter (Signed)
Pt has requested a refill on her temazepam (RESTORIL) 15 MG capsule  [facility] [phone]

## 2019-05-07 NOTE — Telephone Encounter (Signed)
Pt has called for a refill on her HYDROcodone-acetaminophen (NORCO) 10-325 MG tablet [facility] [phone]

## 2019-06-05 DIAGNOSIS — R300 Dysuria: Secondary | ICD-10-CM | POA: Insufficient documentation

## 2019-06-09 ENCOUNTER — Other Ambulatory Visit: Payer: Self-pay | Admitting: Neurology

## 2019-06-09 MED ORDER — HYDROCODONE-ACETAMINOPHEN 10-325 MG PO TABS: ORAL_TABLET | ORAL | 0 refills | Status: DC

## 2019-06-09 NOTE — Telephone Encounter (Signed)
Checked drug registry. She last refilled hydrocodone 05/07/19 #60. Last seen 03/11/19 and next f/u 07/07/19 Last refilled temazepam 05/07/19 #30. However last prescription sent on 05/07/19 #30, with 5 refills. Should have refills at pharmacy.

## 2019-06-09 NOTE — Telephone Encounter (Signed)
Pt is needing a refill on her HYDROcodone-acetaminophen (NORCO) 10-325 MG tablet and her temazepam (RESTORIL) 15 MG capsule sent to [facility] on [address]

## 2019-06-18 ENCOUNTER — Other Ambulatory Visit: Payer: Self-pay | Admitting: *Deleted

## 2019-06-18 DIAGNOSIS — G8929 Other chronic pain: Secondary | ICD-10-CM

## 2019-07-07 ENCOUNTER — Encounter: Payer: Self-pay | Admitting: Neurology

## 2019-07-07 ENCOUNTER — Telehealth (INDEPENDENT_AMBULATORY_CARE_PROVIDER_SITE_OTHER): Payer: Medicare Other | Admitting: Neurology

## 2019-07-07 ENCOUNTER — Telehealth: Payer: Self-pay | Admitting: Neurology

## 2019-07-07 DIAGNOSIS — R208 Other disturbances of skin sensation: Secondary | ICD-10-CM

## 2019-07-07 DIAGNOSIS — Z9181 History of falling: Secondary | ICD-10-CM

## 2019-07-07 DIAGNOSIS — E1142 Type 2 diabetes mellitus with diabetic polyneuropathy: Secondary | ICD-10-CM

## 2019-07-07 DIAGNOSIS — G35 Multiple sclerosis: Secondary | ICD-10-CM

## 2019-07-07 DIAGNOSIS — G4700 Insomnia, unspecified: Secondary | ICD-10-CM | POA: Diagnosis not present

## 2019-07-07 DIAGNOSIS — R269 Unspecified abnormalities of gait and mobility: Secondary | ICD-10-CM

## 2019-07-07 MED ORDER — TEMAZEPAM 15 MG PO CAPS: 15.0000 mg | ORAL_CAPSULE | Freq: Every evening | ORAL | 5 refills | Status: DC | PRN

## 2019-07-07 MED ORDER — HYDROCODONE-ACETAMINOPHEN 10-325 MG PO TABS: ORAL_TABLET | ORAL | 0 refills | Status: DC

## 2019-07-07 MED ORDER — BACLOFEN 20 MG PO TABS: 20.0000 mg | ORAL_TABLET | Freq: Three times a day (TID) | ORAL | 11 refills | Status: DC

## 2019-07-07 NOTE — Progress Notes (Signed)
[facility]  PATIENT: Angela Solomon DOB: 10-11-1976  REFERRING CLINICIAN: Dr. Rita Ohara  HISTORY FROM: patient REASON FOR VISIT: MS   HISTORICAL  CHIEF COMPLAINT:  No chief complaint on file.   HISTORY OF PRESENT ILLNESS:   Angela Solomon is a 43 y.o. woman with relapsing remitting multiple sclerosis.   Update 07/07/2019: Virtual Visit via Video Note I connected with Angela Solomon on 07/07/19 at 11:30 AM EDT by a video enabled telemedicine application and verified that I am speaking with the correct person.  I discussed the limitations of evaluation and management by telemedicine and the availability of in person appointments. The patient expressed understanding and agreed to proceed.  History of Present Illness: She feels her MS is about the same.  She is back on Avonex. No definite exacerbations lately.   We had tried to switch her from Avonex to [facility] but she could not get patient assistance.    The pain is affecting her gait.  Balance is off and she has two recent falls.    She note the right leg feels heavier than the left but strength is similar. Baclofen helps the leg spasticity.  She notes some lower leg numbness, much worse in her feet than shins.   Bladder is fine.   Vision is stable.  She is noting more pain.     She was scheduled for a hysterectomy for fibroids but it was pushed back due to covid-19.   She also has LBP helped by hydrocodone (2 a day) and baclofen.   She feels fatigue is about the same.  She sleeps poorly many nights/  Temazepam helps but not every night.  She falls asleep around 4 am most nights and wakes up around noon.           Observations/Objective:    She is a well-developed well-nourished woman in no acute distress.  The head is normocephalic and atraumatic.  Sclera are anicteric.  Visible skin appears normal.  The neck has a good range of motion.  Pharynx and tongue have normal appearance.  She is alert and fully oriented with  fluent speech and good attention, knowledge and memory.  Extraocular muscles are intact.  Facial strength is normal.  Palatal elevation and tongue protrusion are midline.  She appears to have normal strength in the arms.  Rapid alternating movements and finger-nose-finger are performed well.  Assessment and Plan: 1. Multiple sclerosis ([place])   2. Diabetic polyneuropathy associated with type 2 diabetes mellitus ([place])   3. Dysesthesia   4. Insomnia, unspecified type   5. At risk for falling   6. Gait disturbance    1.   She will continue Avonex for her MS.  I will check an MRI of the brain to determine if there is any subclinical progression.  If present, consider a different disease modifying therapy. 2.   Continue hydrocodone twice daily and baclofen for pain and spasticity.  The [place] controlled substance database has been reviewed.  She has been compliant. 3.   Temazepam for insomnia and nighttime anxiety.  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 4.   Stay active and exercise as tolerated.  Follow CDC guidelines for COVID-19 safety. 5.   She will return to see me in 4 to 5 months or sooner if there are new or worsening neurologic symptoms.  Follow Up Instructions: I discussed the assessment and treatment plan with the patient.  The patient was provided an opportunity to ask questions and all were answered. The patient agreed with the plan and demonstrated an understanding of the instructions.    The patient was advised to call back or seek an in-person evaluation if the symptoms worsen or if the condition fails to improve as anticipated.  I provided 25 minutes of non-face-to-face time during this encounter.   Update 03/11/2019: She is off of the Avonex again.    She has had trouble getting coverage.   She had trouble tolerating Rebif and copaxone in the past.   She has not been on any of the pills.    She feels she has been mostly  stable.      Gait is doing about the same with a limp due to right > left weakness.   No falls.    No hand clumsiness.   She notes some numbness in her feet.      Bladder function is the sam.   Vision is doing ok.   Her DM is doing better and last HgBA1c is 7.7.    She has neck pain doing about the same.      Baclofen and hydrocodone has helped her some.       Update 09/10/2018: She is on Avonex and has tolerated it well.   Last Avonex was 2 months ago due to difficulty getting the medication.    She did better with Avonex than Reif due to side effects and copaxone caused an allergic reaction one shot (within minutes).     She has had a lot of neck pain the past couple months.  Pain was worse in the right neck and radiated to bilateral occiput and shoulders.   Her right SCM muscle was protruding and she had stiffness in her neck.   She went to urgent care. She was diagnosed with spasmodic torticollis.     She was prescribed baclofen 20 mg prn and given a shot of toradol with some benefit.      Flexeril had not helped.     She is upset about the long wait recently for her urine drug test and ultimately having to leave and not getting script.          Update 05/07/2018:   She feels her MS is mostly stable.   She is on Avonex and she tolerates it well.       She fell on the front porch steps and landed on her left knee and elbow.    She fell a few other times the last month also.     She feels the right leg is weaker than her left.   She feels her bladder is ok with some frequency.   She wears pads due to some leakage at times.   She feels her vision is about the same.   Her IOP was mildly elevated but was reportedly better at the last visit.   She reports no diabetic changes in eyes at last exam.    She is sleeping better now than she did in the past.   Trazodone and amitriptyline at bedtime has helped her sleep.  Amitriptyline also helps her dysesthetic pain.    She is 2 x hydrocodones daily.   She has  right leg pain and has a lipoma    Update 11/07/2017:    She is on Avonex.   She tolerates it well.   No definite exacerbations.    She denies any change in  gait or numbness/weakness.   Bladder function is fine.    Vision is fine.  The MRI of the brain 04/24/2016 showed lesions consistent with MS. When compared to the 2014 MRI there were no changes. MRI of the cervical spine 04/24/2016 showed multiple plaques in the cervical spinal cord consistent with MS. When compared to the MRI dated 05/20/2014, there was no interval change.  Her worse pain is in the right leg.   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  She has a f/u visit in December.   She has a dislocation in her left toes and sees podiatry (Dr. Gean Quint).      For her back, she is on Flexeril tid, amitriptyline gabapentin 800 mg po qid and hydrocodone bid to tid.      From 07/04/2017:  MS:   She has been stable on Avonex as she tolerates it well. She has stopped and started the medication several times due to difficulties with insurance and ran out a couple months ago. We are working with her to try to get this reinstated again.  Gait/strength:   Feels her gait is poor able to walk without a walker now. Gait worsened after her back surgery and she continues to report some weakness. 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   She has diabetes and has a superimposed diabetic polyneuropathy with her radicular symptoms.   She notes some benefit with amitriptyline and gabapentin.     Bladder:   She notes some urinary frequency and urgency that has not had incontinence.  Vision: She denies any MS related vision problems.    Mood/Cognition:      She has had depression and anxiety. Depression worsened around the time she dated her back operated on. She only feels she gets a little bit of benefit from medications.    She feels her  short-term memory is poor at times but notes no major cognitive problems.  Fatigue/sleep:   She notes physical morbid cognitive fatigue. Sleep is poor. She is having difficulty both with sleep onset and sleep maintenance insomnia. She feels the amitriptyline and gabapentin have helped her a little bit..   Lower Back Pain: In July 2016 she had surgery for a lumbar radiculopathy (Dr. Hal Neer).   She had a large disc herniation at L4-L5.     Pain improved after surgery but she notes some continued LBP and feels ROM is worse.    Neck Pain/shoulder pain:   She continues to have a lot of neck pain and left greater than right shoulder pain. She has seen orthopedics about the shoulder and has had injections.  ---------------------------------------  MS History:   She presented in 2003 with gait ataxia, slurred speech and right sided paresthesias. Imaging studies were performed and she was diagnosed with multiple sclerosis. Initially, she was placed on Rebif but had skin reactions and switched to Copaxone. On Copaxone, she had an immediate hypersensitivity response and it was discontinued. She has been on Avonex most of the time but has been off for months at a time when she has had insurance difficulties.      Review of MRI's: 1. MRI of the cervical spine dated 05/20/2014. It showed enhancing spinal cord lesions consistent with MS. C2-C3 there was a left lateral focus. Also at C2-C3 there was a posterior focus. At C3-C4  there was a right posterior lateral focus. At C4 there was a dorsal focus and at C5 there was another dorsal focus. 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 There are multilevel degenerative changes most pronounced at C7-T1 where there could be dynamic impingement of the right C8 nerve root.  2. MRI of the brain dated 09/28/2013 showing diffuse white matter foci in the periventricular and deep white matter consistent with [place].  Although there were no enhancing foci, when compared to a  previous MRI dated 10/11/2010, there was some progression.  3. An MRI of the lumbar spine dated 04/27/2015 showed a large disc extrusion with sequestered fragment causing bilateral L5 nerve root compression.   4. MRI of the brain with and without contrast dated 10/16/2008 showed enhancing lesions.  She was reportedly off medication at that time.   REVIEW OF SYSTEMS:  Constitutional: No fevers, chills, sweats, or change in appetite.   Fatigue, poor sleep Eyes: No visual changes, double vision, eye pain Ear, nose and throat: No hearing loss, ear pain, nasal congestion, sore throat Cardiovascular: No chest pain, palpitations Respiratory:  No shortness of breath at rest or with exertion.   No wheezes GastrointestinaI: No nausea, vomiting, diarrhea, abdominal pain, fecal incontinence Genitourinary:  No dysuria, urinary retention or frequency.  No nocturia. Musculoskeletal:  as above. Integumentary: No rash, pruritus, skin lesions Neurological: as above Psychiatric: No depression at this time.  No anxiety Endocrine: No palpitations, diaphoresis, change in appetite, change in weigh or increased thirst Hematologic/Lymphatic:  No anemia, purpura, petechiae. Allergic/Immunologic: No itchy/runny eyes, nasal congestion, recent allergic reactions, rashes  ALLERGIES: Allergies  Allergen Reactions  . Atorvastatin Other (See Comments)    Myalgia Myalgia  . Glatiramer Anaphylaxis    Generic Copaxone  . Lisinopril Anaphylaxis  . Niacin Diarrhea  . Valproic Acid Anaphylaxis  . Buprenorphine Hcl Hives  . Morphine And Related Hives  . Aspirin Nausea And Vomiting    Fevers and GI upset  . Hydrocodone-Acetaminophen Nausea And Vomiting    Pt states can take Hydrocodone but has a reaction to a additive in vicodin  . Zithromax [Azithromycin] Rash    HOME MEDICATIONS: Outpatient Medications Prior to Visit  Medication Sig Dispense Refill  . acetaminophen (TYLENOL) 500 MG tablet Take 1,000 mg by mouth  every 6 (six) hours as needed for mild pain or moderate pain.    Marland Kitchen albuterol (PROVENTIL HFA;VENTOLIN HFA) 108 (90 BASE) MCG/ACT inhaler Inhale 1 puff into the lungs as needed for wheezing or shortness of breath. Reported on 04/06/2016    . amitriptyline (ELAVIL) 75 MG tablet TAKE 1 TABLET BY MOUTH EVERY NIGHT AT BEDTIME 30 tablet 5  . amLODipine (NORVASC) 5 MG tablet Take 5 mg by mouth.    . B-D INS SYR ULTRAFINE 1CC/31G 31G X 5/16" 1 ML MISC See admin instructions.  0  . carvedilol (COREG) 25 MG tablet Take 25 mg by mouth 2 (two) times daily.     . cyclobenzaprine (FLEXERIL) 10 MG tablet Take 1 tablet (10 mg total) 3 (three) times daily as needed by mouth for muscle spasms. 90 tablet 11  . empagliflozin (JARDIANCE) 10 MG TABS tablet Take by mouth.    . ferrous sulfate 325 (65 FE) MG tablet Take 325 mg by mouth daily.     . fluconazole (DIFLUCAN) 150 MG tablet TK 1 T PO  NOW AND IF NOT BETTER REPEAT IN 3 DAYS  0  . fluticasone (FLONASE) 50 MCG/ACT nasal spray Place  2 sprays into both nostrils as needed for allergies.     Marland Kitchen gabapentin (NEURONTIN) 800 MG tablet Take 1 tablet (800 mg total) by mouth 3 (three) times daily. 90 tablet 11  . insulin glargine (LANTUS) 100 UNIT/ML injection Inject into the skin at bedtime.    . interferon beta-1a (AVONEX) 30 MCG injection Inject 30 mcg into the muscle every 7 (seven) days. 12 each 3  . losartan-hydrochlorothiazide (HYZAAR) 100-25 MG per tablet Take 1 tablet by mouth daily.     . pantoprazole (PROTONIX) 40 MG tablet Take 40 mg by mouth at bedtime.     . promethazine (PHENERGAN) 25 MG tablet Take 1 tablet (25 mg total) by mouth every 6 (six) hours as needed for nausea or vomiting. 30 tablet 1  . traZODone (DESYREL) 100 MG tablet TAKE 1 TABLET(100 MG) BY MOUTH AT BEDTIME 30 tablet 3  . baclofen (LIORESAL) 20 MG tablet Take 1 tablet (20 mg total) by mouth 3 (three) times daily. 90 each 11  . HYDROcodone-acetaminophen (NORCO) 10-325 MG tablet One po bid prn 60  tablet 0  . temazepam (RESTORIL) 15 MG capsule Take 1 capsule (15 mg total) by mouth at bedtime as needed for sleep. 30 capsule 5   No facility-administered medications prior to visit.     PAST MEDICAL HISTORY: Past Medical History:  Diagnosis Date  . Anemia   . Depression   . Diabetes mellitus without complication (HCC)    dx at age 25  type 77  . GERD (gastroesophageal reflux disease)   . Headache   . Hypertension   . Multiple sclerosis ([place])   . Neuromuscular disorder ([place])    dx 2003 with MS  . Neuropathy   . PONV (postoperative nausea and vomiting)    sometimes...........  Marland Kitchen Vision abnormalities     PAST SURGICAL HISTORY: Past Surgical History:  Procedure Laterality Date  . CHOLECYSTECTOMY    . DENTAL SURGERY    . LUMBAR LAMINECTOMY WITH COFLEX 1 LEVEL Bilateral 06/29/2015   Procedure: LUMBAR LAMINECTOMY WITH COFLEX 1 LEVEL;  Surgeon: Karie Chimera, MD;  Location: [facility];  Service: Neurosurgery;  Laterality: Bilateral;  LUMBAR LAMINECTOMY WITH COFLEX 1 LEVEL  . right foot surgery     remove a piece of glass  (had c/o for about 1 yr)  . TONSILLECTOMY    . TUBAL LIGATION    . WOUND EXPLORATION N/A 07/22/2015   Procedure: WOUND EXPLORATION;  Surgeon: Karie Chimera, MD;  Location: [facility];  Service: Neurosurgery;  Laterality: N/A;    FAMILY HISTORY: Family History  Problem Relation Age of Onset  . Hypertension Mother   . Diabetes Mother   . Stroke Mother   . Heart attack Mother   . Peripheral vascular disease Mother     SOCIAL HISTORY:  Social History   Socioeconomic History  . Marital status: Married    Spouse name: Not on file  . Number of children: Not on file  . Years of education: Not on file  . Highest education level: Not on file  Occupational History  . Not on file  Social Needs  . Financial resource strain: Not on file  . Food insecurity    Worry: Not on file    Inability: Not on file  . Transportation needs    Medical: Not on file     Non-medical: Not on file  Tobacco Use  . Smoking status: Current Every Day Smoker    Packs/day: 1.00  Years: 24.00    Pack years: 24.00    Types: Cigarettes  . Smokeless tobacco: Never Used  Substance and Sexual Activity  . Alcohol use: Yes    Alcohol/week: 0.0 standard drinks    Comment: occasional  . Drug use: No  . Sexual activity: Not on file  Lifestyle  . Physical activity    Days per week: Not on file    Minutes per session: Not on file  . Stress: Not on file  Relationships  . Social Herbalist on phone: Not on file    Gets together: Not on file    Attends religious service: Not on file    Active member of club or organization: Not on file    Attends meetings of clubs or organizations: Not on file    Relationship status: Not on file  . Intimate partner violence    Fear of current or ex partner: Not on file    Emotionally abused: Not on file    Physically abused: Not on file    Forced sexual activity: Not on file  Other Topics Concern  . Not on file  Social History Narrative  . Not on file     PHYSICAL EXAM  There were no vitals filed for this visit.  There is no height or weight on file to calculate BMI.   General: The patient is well-developed and well-nourished and in no acute distress   Lower Back: She has some tenderness in the lower lumbar paraspinal muscles.  Neurologic Exam  Mental status: She is alert and oriented. The affect is normal.. She has no difficulty with speech. Focus is mildly reduced short-term memory seems appropriate.  Cranial nerves: Extraocular movements are full.  Facial strength and sensation is normal.  Trapezius strength is normal.  . Palatal elevation of protrusion is midline. Hearing is normal and symmetric.    Motor:  Muscle bulk and tone are normal. Strength was normal in the right arm and she had slight weakness 4+/5 in the ulnar innervated muscles of the left. Strength is 4+/5 in the toes. The left great  toe has been surgically removed   Sensory: There is reduced sensation in both feet, worse in the right L5  distribution  Coordination: Cerebellar testing reveals good finger-nose-finger but poor heel-to-shin bilaterally.  Gait and station: Station is wide.  Her gait is arthritic and mildly wide.  The tandem gait is poor.  Romberg is borderline.   Reflexes: Deep tendon reflexes are symmetric in her arms and legs (trace ankle).Nanine Means. Felecia Shelling, MD, PhD 7/56/4332, 95:18 AM Certified in Neurology, Clinical Neurophysiology, Sleep Medicine, Pain Medicine and Neuroimaging  [facility] [address] [place], [place] [address] [phone]

## 2019-07-07 NOTE — Telephone Encounter (Signed)
Medicare/medicaid order sent to [facility]. No auth they will reach out to the patient to schedule.  °

## 2019-08-05 ENCOUNTER — Other Ambulatory Visit: Payer: Self-pay | Admitting: Neurology

## 2019-08-06 ENCOUNTER — Other Ambulatory Visit: Payer: Self-pay | Admitting: Neurology

## 2019-08-06 ENCOUNTER — Telehealth: Payer: Self-pay | Admitting: Neurology

## 2019-08-06 MED ORDER — HYDROCODONE-ACETAMINOPHEN 10-325 MG PO TABS: ORAL_TABLET | ORAL | 0 refills | Status: DC

## 2019-08-06 NOTE — Telephone Encounter (Signed)
08/06/19 LVM to schedule 4 mo f/u w/ Dr. Felecia Shelling

## 2019-08-06 NOTE — Telephone Encounter (Signed)
Pt is needing a refill on her HYDROcodone-acetaminophen (NORCO) 10-325 MG tablet sent to [facility] on [address]

## 2019-08-22 ENCOUNTER — Telehealth: Payer: Self-pay | Admitting: Neurology

## 2019-08-22 NOTE — Telephone Encounter (Signed)
Pt is needing a refill on her temazepam (RESTORIL) 15 MG capsule sent to the [facility] in [place] on [address] and [address]

## 2019-08-25 NOTE — Telephone Encounter (Signed)
Called, LVM asking pt call office. Please relay message below if she calls back

## 2019-08-25 NOTE — Telephone Encounter (Signed)
Reviewed pt chart. Dr. Felecia Shelling last escribed rx 07/07/19 #30, 5 refills. She should have refills available at the pharmacy. I checked drug registry. She last refilled 07/24/19 #30. She should contact pharmacy for refill.

## 2019-09-05 ENCOUNTER — Other Ambulatory Visit: Payer: Self-pay | Admitting: Neurology

## 2019-09-05 NOTE — Telephone Encounter (Signed)
Pt is needing a refill on her HYDROcodone-acetaminophen (NORCO) 10-325 MG tablet sent to the [facility] on [address] and [address]

## 2019-09-08 MED ORDER — HYDROCODONE-ACETAMINOPHEN 10-325 MG PO TABS: ORAL_TABLET | ORAL | 0 refills | Status: DC

## 2019-09-16 ENCOUNTER — Other Ambulatory Visit: Payer: Self-pay | Admitting: Neurology

## 2019-09-21 DIAGNOSIS — B9689 Other specified bacterial agents as the cause of diseases classified elsewhere: Secondary | ICD-10-CM | POA: Insufficient documentation

## 2019-10-03 ENCOUNTER — Other Ambulatory Visit: Payer: Self-pay | Admitting: Neurology

## 2019-10-03 NOTE — Telephone Encounter (Signed)
Pt has called for a refill on her HYDROcodone-acetaminophen (Between) 10-325 MG tablet [place] [phone] (Pt is aware [facility] is not open on Fridays)

## 2019-10-06 MED ORDER — HYDROCODONE-ACETAMINOPHEN 10-325 MG PO TABS: ORAL_TABLET | ORAL | 0 refills | Status: DC

## 2019-10-06 NOTE — Addendum Note (Signed)
Addended by: Edison Pace, EMMA L on: 10/06/2019 08:00 AM   Modules accepted: Orders

## 2019-10-08 ENCOUNTER — Ambulatory Visit: Payer: Medicare Other | Admitting: Neurology

## 2019-10-08 ENCOUNTER — Telehealth: Payer: Self-pay | Admitting: Neurology

## 2019-10-08 NOTE — Telephone Encounter (Signed)
Ordered filled out for back brace, waiting on MD signature and then will fax

## 2019-10-08 NOTE — Telephone Encounter (Signed)
Called and spoke with pt. Relayed we faxed order to fax she gave [place] and received confirmation. She verbalized understanding.

## 2019-10-08 NOTE — Telephone Encounter (Signed)
Pt called in and wanted to know if a script for a back brace can be faxed to [facility]   Fax# [phone]

## 2019-11-06 ENCOUNTER — Telehealth: Payer: Self-pay | Admitting: *Deleted

## 2019-11-06 ENCOUNTER — Ambulatory Visit (INDEPENDENT_AMBULATORY_CARE_PROVIDER_SITE_OTHER): Payer: Medicare Other | Admitting: Neurology

## 2019-11-06 ENCOUNTER — Other Ambulatory Visit: Payer: Self-pay

## 2019-11-06 ENCOUNTER — Encounter: Payer: Self-pay | Admitting: Neurology

## 2019-11-06 VITALS — BP 106/70 | HR 101 | Ht 72.0 in | Wt 262.5 lb

## 2019-11-06 DIAGNOSIS — G43809 Other migraine, not intractable, without status migrainosus: Secondary | ICD-10-CM | POA: Diagnosis not present

## 2019-11-06 DIAGNOSIS — F32A Depression, unspecified: Secondary | ICD-10-CM

## 2019-11-06 DIAGNOSIS — F329 Major depressive disorder, single episode, unspecified: Secondary | ICD-10-CM

## 2019-11-06 DIAGNOSIS — R269 Unspecified abnormalities of gait and mobility: Secondary | ICD-10-CM

## 2019-11-06 DIAGNOSIS — R208 Other disturbances of skin sensation: Secondary | ICD-10-CM | POA: Diagnosis not present

## 2019-11-06 DIAGNOSIS — G35 Multiple sclerosis: Secondary | ICD-10-CM

## 2019-11-06 DIAGNOSIS — F419 Anxiety disorder, unspecified: Secondary | ICD-10-CM

## 2019-11-06 DIAGNOSIS — E1142 Type 2 diabetes mellitus with diabetic polyneuropathy: Secondary | ICD-10-CM | POA: Diagnosis not present

## 2019-11-06 MED ORDER — HYDROCODONE-ACETAMINOPHEN 10-325 MG PO TABS: ORAL_TABLET | ORAL | 0 refills | Status: DC

## 2019-11-06 NOTE — Progress Notes (Signed)
[facility]  PATIENT: Angela Solomon DOB: December 26, 1975  REFERRING CLINICIAN: Dr. Rita Ohara  HISTORY FROM: patient REASON FOR VISIT: MS   HISTORICAL  CHIEF COMPLAINT:  Chief Complaint  Patient presents with  . Multiple Sclerosis    rm 13, "not on MS therapy, no new concerns"    HISTORY OF PRESENT ILLNESS:   Angela Solomon is a 43 y.o. woman with relapsing remitting multiple sclerosis.   Update 11/06/2019: She stopped her Avonex but feels her MS has been stable.  We discussed getting back on it she will hold off..   She denies exacerbations or new symptoms.   She notes no change in her gait with reduced balance.    She has no recent falls.    She is socially distanced for the most part.    She uses a shower chair for safety (had a couple falls in past).   Her right leg is a little weaker than her left.  It is also more painful and she has some swelling in her leg but U/S ruled out DVT.    She denies any leg numbness.  Bladder and bowel function is fine.     Vision is fine.  She sleeps poorly many nights despite temazepam and notes fatigue, some days worse than others (today is a good day).   She notes some mood issues with depression but has no suicidal thoughts.      Her DM is still poorly controlled despite some medication changes.   Her LBP and knee pain is still troublesome.    She has a popping sensation in her back when she pivots.    She is on hydrocodone.   Baclofen does not help her back though it helps the neck pain some.     She gets migraines and is not sure if amitriptyline has helped her much.       Update 07/07/2019 (virtual) She feels her MS is about the same.  She is back on Avonex. No definite exacerbations lately.   We had tried to switch her from Avonex to [facility] but she could not get patient assistance.    The pain is affecting her gait.  Balance is off and she has two recent falls.    She note the right leg feels heavier than the left but  strength is similar. Baclofen helps the leg spasticity.  She notes some lower leg numbness, much worse in her feet than shins.   Bladder is fine.   Vision is stable.  She is noting more pain.     She was scheduled for a hysterectomy for fibroids but it was pushed back due to covid-19.   She also has LBP helped by hydrocodone (2 a day) and baclofen.   She feels fatigue is about the same.  She sleeps poorly many nights/  Temazepam helps but not every night.  She falls asleep around 4 am most nights and wakes up around noon.           Update 03/11/2019: She is off of the Avonex again.    She has had trouble getting coverage.   She had trouble tolerating Rebif and copaxone in the past.   She has not been on any of the pills.    She feels she has been mostly stable.      Gait is doing about the same with a limp due to right > left weakness.   No falls.    No hand clumsiness.  She notes some numbness in her feet.      Bladder function is the sam.   Vision is doing ok.   Her DM is doing better and last HgBA1c is 7.7.    She has neck pain doing about the same.      Baclofen and hydrocodone has helped her some.       Update 09/10/2018: She is on Avonex and has tolerated it well.   Last Avonex was 2 months ago due to difficulty getting the medication.    She did better with Avonex than Reif due to side effects and copaxone caused an allergic reaction one shot (within minutes).     She has had a lot of neck pain the past couple months.  Pain was worse in the right neck and radiated to bilateral occiput and shoulders.   Her right SCM muscle was protruding and she had stiffness in her neck.   She went to urgent care. She was diagnosed with spasmodic torticollis.     She was prescribed baclofen 20 mg prn and given a shot of toradol with some benefit.      Flexeril had not helped.     She is upset about the long wait recently for her urine drug test and ultimately having to leave and not getting script.           Update 05/07/2018:   She feels her MS is mostly stable.   She is on Avonex and she tolerates it well.       She fell on the front porch steps and landed on her left knee and elbow.    She fell a few other times the last month also.     She feels the right leg is weaker than her left.   She feels her bladder is ok with some frequency.   She wears pads due to some leakage at times.   She feels her vision is about the same.   Her IOP was mildly elevated but was reportedly better at the last visit.   She reports no diabetic changes in eyes at last exam.    She is sleeping better now than she did in the past.   Trazodone and amitriptyline at bedtime has helped her sleep.  Amitriptyline also helps her dysesthetic pain.    She is 2 x hydrocodones daily.   She has right leg pain and has a lipoma    Update 11/07/2017:    She is on Avonex.   She tolerates it well.   No definite exacerbations.    She denies any change in gait or numbness/weakness.   Bladder function is fine.    Vision is fine.  The MRI of the brain 04/24/2016 showed lesions consistent with MS. When compared to the 2014 MRI there were no changes. MRI of the cervical spine 04/24/2016 showed multiple plaques in the cervical spinal cord consistent with MS. When compared to the MRI dated 05/20/2014, there was no interval change.  Her worse pain is in the right leg.   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  She has a f/u visit in December.   She has a dislocation in her left toes and sees podiatry (Dr. Gean Quint).      For her back, she is on Flexeril tid, amitriptyline gabapentin 800 mg po qid and hydrocodone bid to tid.      From 07/04/2017:  MS:   She has been stable on Avonex as she  tolerates it well. She has stopped and started the medication several times due to difficulties with insurance and ran out a couple months ago. We are working with her to try to get this reinstated again.  Gait/strength:   Feels her gait is poor able to walk without a walker  now. Gait worsened after her back surgery and she continues to report some weakness. 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   She has diabetes and has a superimposed diabetic polyneuropathy with her radicular symptoms.   She notes some benefit with amitriptyline and gabapentin.     Bladder:   She notes some urinary frequency and urgency that has not had incontinence.  Vision: She denies any MS related vision problems.    Mood/Cognition:      She has had depression and anxiety. Depression worsened around the time she dated her back operated on. She only feels she gets a little bit of benefit from medications.    She feels her short-term memory is poor at times but notes no major cognitive problems.  Fatigue/sleep:   She notes physical morbid cognitive fatigue. Sleep is poor. She is having difficulty both with sleep onset and sleep maintenance insomnia. She feels the amitriptyline and gabapentin have helped her a little bit..   Lower Back Pain: In July 2016 she had surgery for a lumbar radiculopathy (Dr. Hal Neer).   She had a large disc herniation at L4-L5.     Pain improved after surgery but she notes some continued LBP and feels ROM is worse.    Neck Pain/shoulder pain:   She continues to have a lot of neck pain and left greater than right shoulder pain. She has seen orthopedics about the shoulder and has had injections.  ---------------------------------------  MS History:   She presented in 2003 with gait ataxia, slurred speech and right sided paresthesias. Imaging studies were performed and she was diagnosed with multiple sclerosis. Initially, she was placed on Rebif but had skin reactions and switched to Copaxone. On Copaxone, she had an immediate hypersensitivity response and it was discontinued. She has been on Avonex most of the time but has been off for months at a  time when she has had insurance difficulties.      Review of MRI's: 1. MRI of the cervical spine dated 05/20/2014. It showed enhancing spinal cord lesions consistent with MS. C2-C3 there was a left lateral focus. Also at C2-C3 there was a posterior focus. At C3-C4 there was a right posterior lateral focus. At C4 there was a dorsal focus and at C5 there was another dorsal focus. 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 There are multilevel degenerative changes most pronounced at C7-T1 where there could be dynamic impingement of the right C8 nerve root.  2. MRI of the brain dated 09/28/2013 showing diffuse white matter foci in the periventricular and deep white matter consistent with [place].  Although there were no enhancing foci, when compared to a previous MRI dated 10/11/2010, there was some progression.  3. An MRI of the lumbar spine dated 04/27/2015 showed a large disc extrusion with sequestered fragment causing bilateral L5 nerve root compression.   4. MRI of the brain with and without contrast dated 10/16/2008 showed enhancing lesions.  She was reportedly off medication at that time.   REVIEW OF SYSTEMS:  Constitutional: No fevers, chills, sweats, or change in appetite.   Fatigue, poor sleep Eyes: No visual changes, double vision, eye pain Ear, nose and throat: No hearing loss, ear pain, nasal congestion, sore throat Cardiovascular: No chest pain, palpitations Respiratory:  No shortness of breath at rest or with exertion.   No wheezes GastrointestinaI: No nausea, vomiting, diarrhea, abdominal pain, fecal incontinence Genitourinary:  No dysuria, urinary retention or frequency.  No nocturia. Musculoskeletal:  as above. Integumentary: No rash, pruritus, skin lesions Neurological: as above Psychiatric: No depression at this time.  No anxiety Endocrine: No palpitations, diaphoresis, change in appetite, change in weigh or increased thirst Hematologic/Lymphatic:  No anemia, purpura, petechiae.  Allergic/Immunologic: No itchy/runny eyes, nasal congestion, recent allergic reactions, rashes  ALLERGIES: Allergies  Allergen Reactions  . Atorvastatin Other (See Comments)    Myalgia Myalgia  . Glatiramer Anaphylaxis    Generic Copaxone  . Lisinopril Anaphylaxis  . Niacin Diarrhea  . Valproic Acid Anaphylaxis  . Buprenorphine Hcl Hives  . Morphine And Related Hives  . Aspirin Nausea And Vomiting    Fevers and GI upset  . Hydrocodone-Acetaminophen Nausea And Vomiting    Pt states can take Hydrocodone but has a reaction to a additive in vicodin  . Zithromax [Azithromycin] Rash    HOME MEDICATIONS: Outpatient Medications Prior to Visit  Medication Sig Dispense Refill  . acetaminophen (TYLENOL) 500 MG tablet Take 1,000 mg by mouth every 6 (six) hours as needed for mild pain or moderate pain.    Marland Kitchen albuterol (PROVENTIL HFA;VENTOLIN HFA) 108 (90 BASE) MCG/ACT inhaler Inhale 1 puff into the lungs as needed for wheezing or shortness of breath. Reported on 04/06/2016    . amitriptyline (ELAVIL) 75 MG tablet TAKE 1 TABLET BY MOUTH EVERY NIGHT AT BEDTIME 30 tablet 5  . B-D INS SYR ULTRAFINE 1CC/31G 31G X 5/16" 1 ML MISC See admin instructions.  0  . baclofen (LIORESAL) 20 MG tablet Take 1 tablet (20 mg total) by mouth 3 (three) times daily. 90 each 11  . carvedilol (COREG) 25 MG tablet Take 25 mg by mouth 2 (two) times daily.     . cyclobenzaprine (FLEXERIL) 10 MG tablet Take 1 tablet (10 mg total) 3 (three) times daily as needed by mouth for muscle spasms. 90 tablet 11  . empagliflozin (JARDIANCE) 10 MG TABS tablet Take by mouth.    . ferrous sulfate 325 (65 FE) MG tablet Take 325 mg by mouth daily.     . fluconazole (DIFLUCAN) 150 MG tablet TK 1 T PO  NOW AND IF NOT BETTER REPEAT IN 3 DAYS  0  . fluticasone (FLONASE) 50 MCG/ACT nasal spray Place 2 sprays into both nostrils as needed for allergies.     Marland Kitchen gabapentin (NEURONTIN) 800 MG tablet TAKE 1 TABLET(800 MG) BY MOUTH THREE TIMES DAILY  90 tablet 11  . insulin glargine (LANTUS) 100 UNIT/ML injection Inject into the skin at bedtime.    . interferon beta-1a (AVONEX) 30 MCG injection Inject 30 mcg into the muscle every 7 (seven) days. 12 each 3  . losartan-hydrochlorothiazide (HYZAAR) 100-25 MG per tablet Take 1 tablet by mouth daily.     . pantoprazole (PROTONIX) 40 MG tablet Take 40 mg by mouth at bedtime.     . promethazine (PHENERGAN) 25 MG tablet Take 1 tablet (25 mg total) by mouth every 6 (six) hours as needed for nausea or vomiting. 30 tablet 1  . temazepam (RESTORIL) 15 MG capsule Take 1 capsule (15 mg  total) by mouth at bedtime as needed for sleep. 30 capsule 5  . traZODone (DESYREL) 100 MG tablet TAKE 1 TABLET(100 MG) BY MOUTH AT BEDTIME 30 tablet 3  . HYDROcodone-acetaminophen (NORCO) 10-325 MG tablet One po bid prn 60 tablet 0  . amLODipine (NORVASC) 5 MG tablet Take 5 mg by mouth.     No facility-administered medications prior to visit.     PAST MEDICAL HISTORY: Past Medical History:  Diagnosis Date  . Anemia   . Depression   . Diabetes mellitus without complication (HCC)    dx at age 9  type 79  . GERD (gastroesophageal reflux disease)   . Headache   . Hypertension   . Multiple sclerosis ([place])   . Neuromuscular disorder ([place])    dx 2003 with MS  . Neuropathy   . PONV (postoperative nausea and vomiting)    sometimes...........  Marland Kitchen Vision abnormalities     PAST SURGICAL HISTORY: Past Surgical History:  Procedure Laterality Date  . CHOLECYSTECTOMY    . DENTAL SURGERY    . LUMBAR LAMINECTOMY WITH COFLEX 1 LEVEL Bilateral 06/29/2015   Procedure: LUMBAR LAMINECTOMY WITH COFLEX 1 LEVEL;  Surgeon: Karie Chimera, MD;  Location: [facility];  Service: Neurosurgery;  Laterality: Bilateral;  LUMBAR LAMINECTOMY WITH COFLEX 1 LEVEL  . right foot surgery     remove a piece of glass  (had c/o for about 1 yr)  . TONSILLECTOMY    . TUBAL LIGATION    . WOUND EXPLORATION N/A 07/22/2015   Procedure: WOUND  EXPLORATION;  Surgeon: Karie Chimera, MD;  Location: [place];  Service: Neurosurgery;  Laterality: N/A;    FAMILY HISTORY: Family History  Problem Relation Age of Onset  . Hypertension Mother   . Diabetes Mother   . Stroke Mother   . Heart attack Mother   . Peripheral vascular disease Mother     SOCIAL HISTORY:  Social History   Socioeconomic History  . Marital status: Married    Spouse name: Not on file  . Number of children: Not on file  . Years of education: Not on file  . Highest education level: Not on file  Occupational History  . Not on file  Social Needs  . Financial resource strain: Not on file  . Food insecurity    Worry: Not on file    Inability: Not on file  . Transportation needs    Medical: Not on file    Non-medical: Not on file  Tobacco Use  . Smoking status: Current Every Day Smoker    Packs/day: 1.00    Years: 24.00    Pack years: 24.00    Types: Cigarettes  . Smokeless tobacco: Never Used  Substance and Sexual Activity  . Alcohol use: Yes    Alcohol/week: 0.0 standard drinks    Comment: occasional  . Drug use: No  . Sexual activity: Not on file  Lifestyle  . Physical activity    Days per week: Not on file    Minutes per session: Not on file  . Stress: Not on file  Relationships  . Social Herbalist on phone: Not on file    Gets together: Not on file    Attends religious service: Not on file    Active member of club or organization: Not on file    Attends meetings of clubs or organizations: Not on file    Relationship status: Not on file  . Intimate partner violence  Fear of current or ex partner: Not on file    Emotionally abused: Not on file    Physically abused: Not on file    Forced sexual activity: Not on file  Other Topics Concern  . Not on file  Social History Narrative  . Not on file     PHYSICAL EXAM  Vitals:   11/06/19 1538  BP: 106/70  Pulse: (!) 101  Weight: 262 lb 8 oz (119.1 kg)  Height: 6'  (1.829 m)    Body mass index is 35.6 kg/m.   General: The patient is well-developed and well-nourished and in no acute distress   Lower Back: She has some tenderness in the lower lumbar paraspinal muscles.  Neurologic Exam  Mental status: She is alert and oriented. The affect is normal.. She has no difficulty with speech. Focus is mildly reduced short-term memory seems appropriate.  Cranial nerves: Extraocular movements are full.  Facial strength and sensation is normal.  Trapezius strength is normal.  . Palatal elevation of protrusion is midline. Hearing is normal and symmetric.    Motor:  Muscle bulk and tone are normal. Strength was normal in the right arm and she had slight weakness 4+/5 in the ulnar innervated muscles of the left. Strength is 4+/5 in the toes. The left great toe has been surgically removed   Sensory: There is normal sensation in the arms and proximal legs  Coordination: Cerebellar testing reveals good finger-nose-finger but poor heel-to-shin bilaterally.  Gait and station: Station is wide.  Her gait is arthritic and mildly wide.  The tandem gait is poor.  Romberg is borderline.   Reflexes: Deep tendon reflexes are symmetric in her arms and legs (trace ankle)..   _________________________________  Multiple sclerosis ([place])  Diabetic polyneuropathy associated with type 2 diabetes mellitus ([place])  Other migraine without status migrainosus, not intractable  Dysesthesia  Gait disturbance  Anxiety  Depression, unspecified depression type  1.   She will need to restart any disease modifying therapy when relapses. 2.   Continue temazepam for insomnia and hydrocodone for pain (renew) 3.   Stay active and exercise as tolerated. 4.   Continue to use the shower chair  5.   rtc 6 months, sooner if problems  Richard A. Felecia Shelling, MD, PhD [id number], 0000000 PM Certified in Neurology, Clinical Neurophysiology, Sleep Medicine, Pain Medicine and Neuroimaging  [facility] [address], [place], [place] [address] [phone]

## 2019-11-06 NOTE — Telephone Encounter (Signed)
Faxed signed order back to [facility] at [phone] re: lumbar orthosis. Received fax confirmation.

## 2019-11-14 ENCOUNTER — Other Ambulatory Visit: Payer: Self-pay | Admitting: Neurology

## 2019-12-05 ENCOUNTER — Other Ambulatory Visit: Payer: Self-pay | Admitting: Neurology

## 2019-12-05 MED ORDER — HYDROCODONE-ACETAMINOPHEN 10-325 MG PO TABS: ORAL_TABLET | ORAL | 0 refills | Status: DC

## 2019-12-05 NOTE — Telephone Encounter (Signed)
[place] drug registry has been verified. Last refill was 11/07/2019 # 60 for a 30 day supply.  Pt is up to date on her visits.

## 2019-12-05 NOTE — Telephone Encounter (Signed)
1) Medication(s) Requested (by name): HYDROcodone-acetaminophen (NORCO) 10-325 MG tablet   2) Pharmacy of Choice:  [facility] [id number] - [place], [address] AT [address] [place]  [place], [place] [id number]

## 2020-01-05 ENCOUNTER — Other Ambulatory Visit: Payer: Self-pay | Admitting: Neurology

## 2020-01-05 MED ORDER — HYDROCODONE-ACETAMINOPHEN 10-325 MG PO TABS: ORAL_TABLET | ORAL | 0 refills | Status: DC

## 2020-01-05 NOTE — Telephone Encounter (Signed)
Pt is needing a refill on her HYDROcodone-acetaminophen (NORCO) 10-325 MG tablet sent in to the [facility] on [address] In [facility]

## 2020-01-07 ENCOUNTER — Other Ambulatory Visit: Payer: Self-pay | Admitting: Neurology

## 2020-02-05 ENCOUNTER — Other Ambulatory Visit: Payer: Self-pay | Admitting: Neurology

## 2020-02-05 MED ORDER — HYDROCODONE-ACETAMINOPHEN 10-325 MG PO TABS: ORAL_TABLET | ORAL | 0 refills | Status: DC

## 2020-02-05 NOTE — Telephone Encounter (Signed)
Pt is needing a refill on her HYDROcodone-acetaminophen (NORCO) 10-325 MG tablet sent in to the [facility] on [address] in [facility]

## 2020-03-04 ENCOUNTER — Other Ambulatory Visit: Payer: Self-pay | Admitting: Neurology

## 2020-03-04 MED ORDER — HYDROCODONE-ACETAMINOPHEN 10-325 MG PO TABS: ORAL_TABLET | ORAL | 0 refills | Status: DC

## 2020-03-04 NOTE — Telephone Encounter (Signed)
1) Medication(s) Requested (by name): HYDROcodone-acetaminophen (NORCO) 10-325 MG tablet  2) Pharmacy of Choice: [facility] [id number] - [place] - [address]  [address], [place]

## 2020-04-02 ENCOUNTER — Other Ambulatory Visit: Payer: Self-pay | Admitting: Neurology

## 2020-04-02 NOTE — Telephone Encounter (Signed)
Pt has called for a refill on her HYDROcodone-acetaminophen (NORCO) 10-325 MG tablet [facility] [phone]

## 2020-04-04 MED ORDER — HYDROCODONE-ACETAMINOPHEN 10-325 MG PO TABS: ORAL_TABLET | ORAL | 0 refills | Status: DC

## 2020-04-04 NOTE — Addendum Note (Signed)
Addended by: Wyvonnia Lora on: 04/04/2020 07:56 PM   Modules accepted: Orders

## 2020-04-12 ENCOUNTER — Other Ambulatory Visit: Payer: Self-pay | Admitting: Neurology

## 2020-05-05 ENCOUNTER — Telehealth: Payer: Self-pay | Admitting: Neurology

## 2020-05-05 ENCOUNTER — Encounter: Payer: Self-pay | Admitting: Neurology

## 2020-05-05 ENCOUNTER — Telehealth (INDEPENDENT_AMBULATORY_CARE_PROVIDER_SITE_OTHER): Payer: Medicare Other | Admitting: Neurology

## 2020-05-05 ENCOUNTER — Telehealth: Payer: Self-pay | Admitting: *Deleted

## 2020-05-05 DIAGNOSIS — G35 Multiple sclerosis: Secondary | ICD-10-CM | POA: Diagnosis not present

## 2020-05-05 DIAGNOSIS — R208 Other disturbances of skin sensation: Secondary | ICD-10-CM

## 2020-05-05 DIAGNOSIS — M545 Low back pain: Secondary | ICD-10-CM | POA: Diagnosis not present

## 2020-05-05 DIAGNOSIS — G8929 Other chronic pain: Secondary | ICD-10-CM

## 2020-05-05 DIAGNOSIS — G35D Multiple sclerosis, unspecified: Secondary | ICD-10-CM

## 2020-05-05 DIAGNOSIS — E1142 Type 2 diabetes mellitus with diabetic polyneuropathy: Secondary | ICD-10-CM

## 2020-05-05 DIAGNOSIS — R269 Unspecified abnormalities of gait and mobility: Secondary | ICD-10-CM

## 2020-05-05 MED ORDER — HYDROCODONE-ACETAMINOPHEN 10-325 MG PO TABS: ORAL_TABLET | ORAL | 0 refills | Status: DC

## 2020-05-05 MED ORDER — GABAPENTIN 800 MG PO TABS: 800.0000 mg | ORAL_TABLET | Freq: Four times a day (QID) | ORAL | 11 refills | Status: DC

## 2020-05-05 NOTE — Telephone Encounter (Signed)
Pt gave consent for insurance to be filed for vv  Pt understands that although there may be some limitations with this type of visit, we will take all precautions to reduce any security or privacy concerns.  Pt understands that this will be treated like an in office visit and we will file with pt's insurance, and there may be a patient responsible charge related to this service.   Pt will call back later if she is able to come into the office but the plan for now is to log on @ 3:45 for the 4:00 my chart visit with Dr Felecia Shelling

## 2020-05-05 NOTE — Telephone Encounter (Signed)
Called pt. Scheduled 4 month f/u for 09/06/20 at 1130am with Dr. Felecia Shelling

## 2020-05-05 NOTE — Telephone Encounter (Addendum)
Called pt back to get her chart info updated for mychart visit. Updated medication list, pharmacy, allergy list.  She has not had Avonex for the past month. She is going to call pharmacy to provide new insurance info. She now has [employer]. ID: [id number]. Group: [id number]. RxBIN: [id number]. RxPCN: [id number]. Rxgrp: [id number]. Phone #: [phone].   She was advised to request refills at visit today.

## 2020-05-05 NOTE — Progress Notes (Signed)
[facility]  PATIENT: Angela Solomon DOB: [birth year]  REFERRING CLINICIAN: Dr. Rita Ohara  HISTORY FROM: patient REASON FOR VISIT: MS   HISTORICAL  CHIEF COMPLAINT:  Chief Complaint  Patient presents with  . Multiple Sclerosis  . Back Pain    HISTORY OF PRESENT ILLNESS:   Angela Solomon is a 44 y.o. woman with relapsing remitting multiple sclerosis and back pain.   Update 05/05/2020: Virtual Visit via Video Note I connected with Angela Solomon  on 05/05/20 at  4:00 PM EDT by a video enabled telemedicine application and verified that I am speaking with the correct person.  I discussed the limitations of evaluation and management by telemedicine and the availability of in person appointments. The patient expressed understanding and agreed to proceed.  Due to poor sound quality on the video, most of the visit was done over the phone.  History of Present Illness: She is back on Avonex though has been out the last month.     She has been back in touch with [employer] and should be getting a delivery.     She has IDDM.  She has a decubitus ulcer and is seeing wound care.  It is at the ankle.  She had infection following lumbar surgery at L4L5 in 2016.     She is reporting more lower back pain worse when she has a more active day.   Pian is on the left side more than right.    She takes hydrocodone 10 mg bid.   She takes 800 mg gabapentin po tid .  She is also on baclofen.  She was on Flexeril in the past.   She is trying to be more active and does some walks --- but feels worse if she exercises more.     Observations/Objective: She is alert and fully oriented with fluent speech and good attention, knowledge and memory.  Extraocular muscles are intact.  Facial strength appears normal.  Assessment and Plan: Multiple sclerosis (HCC)  Dysesthesia  Diabetic polyneuropathy associated with type 2 diabetes mellitus (HCC)  Chronic low back pain, unspecified back pain  laterality, unspecified whether sciatica present  Gait disturbance  1.   Continue Avonex.  She is advised to give [place] a call back if she has trouble getting her next refill. 2.   Continue hydrocodone for low back pain.  I will increase the gabapentin to 4 times a day to see if that helps her pain better.  She should continue to try to be active and exercise as tolerated. 3.   Continue to follow-up with wound care. 4.   She will return to see [place] in 4 months or sooner if there are new or worsening neurologic symptoms.  Follow Up Instructions: I discussed the assessment and treatment plan with the patient. The patient was provided an opportunity to ask questions and all were answered. The patient agreed with the plan and demonstrated an understanding of the instructions.    The patient was advised to call back or seek an in-person evaluation if the symptoms worsen or if the condition fails to improve as anticipated.  I provided 25 minutes of non-face-to-face time during this encounter.    Update 11/06/2019: She stopped her Avonex but feels her MS has been stable.  We discussed getting back on it she will hold off..   She denies exacerbations or new symptoms.   She notes no change in her gait with reduced balance.    She has no  recent falls.    She is socially distanced for the most part.    She uses a shower chair for safety (had a couple falls in past).   Her right leg is a little weaker than her left.  It is also more painful and she has some swelling in her leg but U/S ruled out DVT.    She denies any leg numbness.  Bladder and bowel function is fine.     Vision is fine.  She sleeps poorly many nights despite temazepam and notes fatigue, some days worse than others (today is a good day).   She notes some mood issues with depression but has no suicidal thoughts.      Her DM is still poorly controlled despite some medication changes.   Her LBP and knee pain is still troublesome.    She has a  popping sensation in her back when she pivots.    She is on hydrocodone.   Baclofen does not help her back though it helps the neck pain some.     She gets migraines and is not sure if amitriptyline has helped her much.       Update 07/07/2019 (virtual) She feels her MS is about the same.  She is back on Avonex. No definite exacerbations lately.   We had tried to switch her from Avonex to [facility] but she could not get patient assistance.    The pain is affecting her gait.  Balance is off and she has two recent falls.    She note the right leg feels heavier than the left but strength is similar. Baclofen helps the leg spasticity.  She notes some lower leg numbness, much worse in her feet than shins.   Bladder is fine.   Vision is stable.  She is noting more pain.     She was scheduled for a hysterectomy for fibroids but it was pushed back due to covid-19.   She also has LBP helped by hydrocodone (2 a day) and baclofen.   She feels fatigue is about the same.  She sleeps poorly many nights/  Temazepam helps but not every night.  She falls asleep around 4 am most nights and wakes up around noon.          Update 03/11/2019: She is off of the Avonex again.    She has had trouble getting coverage.   She had trouble tolerating Rebif and copaxone in the past.   She has not been on any of the pills.    She feels she has been mostly stable.      Gait is doing about the same with a limp due to right > left weakness.   No falls.    No hand clumsiness.   She notes some numbness in her feet.      Bladder function is the sam.   Vision is doing ok.   Her DM is doing better and last HgBA1c is 7.7.    She has neck pain doing about the same.      Baclofen and hydrocodone has helped her some.       Update 09/10/2018: She is on Avonex and has tolerated it well.   Last Avonex was 2 months ago due to difficulty getting the medication.    She did better with Avonex than Reif due to side effects and copaxone caused an allergic  reaction one shot (within minutes).     She has had a lot of neck  pain the past couple months.  Pain was worse in the right neck and radiated to bilateral occiput and shoulders.   Her right SCM muscle was protruding and she had stiffness in her neck.   She went to urgent care. She was diagnosed with spasmodic torticollis.     She was prescribed baclofen 20 mg prn and given a shot of toradol with some benefit.      Flexeril had not helped.     She is upset about the long wait recently for her urine drug test and ultimately having to leave and not getting script.          Update 05/07/2018:   She feels her MS is mostly stable.   She is on Avonex and she tolerates it well.       She fell on the front porch steps and landed on her left knee and elbow.    She fell a few other times the last month also.     She feels the right leg is weaker than her left.   She feels her bladder is ok with some frequency.   She wears pads due to some leakage at times.   She feels her vision is about the same.   Her IOP was mildly elevated but was reportedly better at the last visit.   She reports no diabetic changes in eyes at last exam.    She is sleeping better now than she did in the past.   Trazodone and amitriptyline at bedtime has helped her sleep.  Amitriptyline also helps her dysesthetic pain.    She is 2 x hydrocodones daily.   She has right leg pain and has a lipoma    Update 11/07/2017:    She is on Avonex.   She tolerates it well.   No definite exacerbations.    She denies any change in gait or numbness/weakness.   Bladder function is fine.    Vision is fine.  The MRI of the brain 04/24/2016 showed lesions consistent with MS. When compared to the 2014 MRI there were no changes. MRI of the cervical spine 04/24/2016 showed multiple plaques in the cervical spinal cord consistent with MS. When compared to the MRI dated 05/20/2014, there was no interval change.  Her worse pain is in the right leg.   She has a lipoma  in the right leg.  She has a f/u visit in December.   She has a dislocation in her left toes and sees podiatry (Dr. Gean Quint).      For her back, she is on Flexeril tid, amitriptyline gabapentin 800 mg po qid and hydrocodone bid to tid.      From 07/04/2017:  MS:   She has been stable on Avonex as she tolerates it well. She has stopped and started the medication several times due to difficulties with insurance and ran out a couple months ago. We are working with her to try to get this reinstated again.  Gait/strength:   Feels her gait is poor able to walk without a walker now. Gait worsened after her back surgery and she continues to report some weakness. 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   She has diabetes and has a superimposed diabetic polyneuropathy with her radicular symptoms.   She notes some benefit with amitriptyline and gabapentin.  Bladder:   She notes some urinary frequency and urgency that has not had incontinence.  Vision: She denies any MS related vision problems.    Mood/Cognition:      She has had depression and anxiety. Depression worsened around the time she dated her back operated on. She only feels she gets a little bit of benefit from medications.    She feels her short-term memory is poor at times but notes no major cognitive problems.  Fatigue/sleep:   She notes physical morbid cognitive fatigue. Sleep is poor. She is having difficulty both with sleep onset and sleep maintenance insomnia. She feels the amitriptyline and gabapentin have helped her a little bit..   Lower Back Pain: In July 2016 she had surgery for a lumbar radiculopathy (Dr. Hal Neer).   She had a large disc herniation at L4-L5.     Pain improved after surgery but she notes some continued LBP and feels ROM is worse.    Neck Pain/shoulder pain:   She continues to have a lot of neck  pain and left greater than right shoulder pain. She has seen orthopedics about the shoulder and has had injections.  ---------------------------------------  MS History:   She presented in 2003 with gait ataxia, slurred speech and right sided paresthesias. Imaging studies were performed and she was diagnosed with multiple sclerosis. Initially, she was placed on Rebif but had skin reactions and switched to Copaxone. On Copaxone, she had an immediate hypersensitivity response and it was discontinued. She has been on Avonex most of the time but has been off for months at a time when she has had insurance difficulties.      Review of MRI's: 1. MRI of the cervical spine dated 05/20/2014. It showed enhancing spinal cord lesions consistent with MS. C2-C3 there was a left lateral focus. Also at C2-C3 there was a posterior focus. At C3-C4 there was a right posterior lateral focus. At C4 there was a dorsal focus and at C5 there was another dorsal focus. 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 There are multilevel degenerative changes most pronounced at C7-T1 where there could be dynamic impingement of the right C8 nerve root.  2. MRI of the brain dated 09/28/2013 showing diffuse white matter foci in the periventricular and deep white matter consistent with [place].  Although there were no enhancing foci, when compared to a previous MRI dated 10/11/2010, there was some progression.  3. An MRI of the lumbar spine dated 04/27/2015 showed a large disc extrusion with sequestered fragment causing bilateral L5 nerve root compression.   4. MRI of the brain with and without contrast dated 10/16/2008 showed enhancing lesions.  She was reportedly off medication at that time.   REVIEW OF SYSTEMS:  Constitutional: No fevers, chills, sweats, or change in appetite.   Fatigue, poor sleep Eyes: No visual changes, double vision, eye pain Ear, nose and throat: No hearing loss, ear pain, nasal congestion, sore  throat Cardiovascular: No chest pain, palpitations Respiratory:  No shortness of breath at rest or with exertion.   No wheezes GastrointestinaI: No nausea, vomiting, diarrhea, abdominal pain, fecal incontinence Genitourinary:  No dysuria, urinary retention or frequency.  No nocturia. Musculoskeletal:  as above. Integumentary: No rash, pruritus, skin lesions Neurological: as above Psychiatric: No depression at this time.  No anxiety Endocrine: No palpitations, diaphoresis, change in appetite, change in weigh or increased thirst Hematologic/Lymphatic:  No anemia, purpura, petechiae. Allergic/Immunologic: No itchy/runny eyes, nasal congestion, recent allergic reactions, rashes  ALLERGIES: Allergies  Allergen Reactions  . Atorvastatin Other (See Comments)    Myalgia Myalgia  . Glatiramer Anaphylaxis    Generic Copaxone  . Lisinopril Anaphylaxis  . Niacin Diarrhea  . Valproic Acid Anaphylaxis  . Buprenorphine Hcl Hives  . Morphine And Related Hives  . Aspirin Nausea And Vomiting    Fevers and GI upset  . Hydrocodone-Acetaminophen Nausea And Vomiting    Pt states can take Hydrocodone but has a reaction to a additive in vicodin  . Zithromax [Azithromycin] Rash    HOME MEDICATIONS: Outpatient Medications Prior to Visit  Medication Sig Dispense Refill  . acetaminophen (TYLENOL) 500 MG tablet Take 1,000 mg by mouth every 6 (six) hours as needed for mild pain or moderate pain.    Marland Kitchen albuterol (PROVENTIL HFA;VENTOLIN HFA) 108 (90 BASE) MCG/ACT inhaler Inhale 1 puff into the lungs as needed for wheezing or shortness of breath. Reported on 04/06/2016    . amitriptyline (ELAVIL) 75 MG tablet TAKE 1 TABLET BY MOUTH EVERY NIGHT AT BEDTIME 30 tablet 5  . amLODipine (NORVASC) 5 MG tablet Take 5 mg by mouth.    . B-D INS SYR ULTRAFINE 1CC/31G 31G X 5/16" 1 ML MISC See admin instructions.  0  . baclofen (LIORESAL) 20 MG tablet Take 1 tablet (20 mg total) by mouth 3 (three) times daily. 90 each 11   . carvedilol (COREG) 25 MG tablet Take 25 mg by mouth 2 (two) times daily.     . cyclobenzaprine (FLEXERIL) 10 MG tablet Take 1 tablet (10 mg total) 3 (three) times daily as needed by mouth for muscle spasms. (Patient not taking: Reported on 05/05/2020) 90 tablet 11  . empagliflozin (JARDIANCE) 10 MG TABS tablet Take 25 mg by mouth daily.     . ferrous sulfate 325 (65 FE) MG tablet Take 325 mg by mouth daily.     . fluconazole (DIFLUCAN) 150 MG tablet as needed.   0  . fluticasone (FLONASE) 50 MCG/ACT nasal spray Place 2 sprays into both nostrils as needed for allergies.     Marland Kitchen insulin glargine (LANTUS) 100 UNIT/ML injection Inject 50 Units into the skin 2 (two) times daily.     . interferon beta-1a (AVONEX) 30 MCG injection Inject 30 mcg into the muscle every 7 (seven) days. 12 each 3  . losartan-hydrochlorothiazide (HYZAAR) 100-25 MG per tablet Take 1 tablet by mouth daily.     . pantoprazole (PROTONIX) 40 MG tablet Take 40 mg by mouth at bedtime.     . promethazine (PHENERGAN) 25 MG tablet Take 1 tablet (25 mg total) by mouth every 6 (six) hours as needed for nausea or vomiting. 30 tablet 1  . temazepam (RESTORIL) 15 MG capsule TAKE 1 CAPSULE(15 MG) BY MOUTH AT BEDTIME AS NEEDED FOR SLEEP 30 capsule 5  . traZODone (DESYREL) 100 MG tablet TAKE 1 TABLET(100 MG) BY MOUTH AT BEDTIME 30 tablet 3  . gabapentin (NEURONTIN) 800 MG tablet TAKE 1 TABLET(800 MG) BY MOUTH THREE TIMES DAILY 90 tablet 11  . HYDROcodone-acetaminophen (NORCO) 10-325 MG tablet One po bid prn 60 tablet 0   No facility-administered medications prior to visit.    PAST MEDICAL HISTORY: Past Medical History:  Diagnosis Date  . Anemia   . Depression   . Diabetes mellitus without complication (HCC)    dx at age 60  type 65  . GERD (gastroesophageal reflux disease)   . Headache   . Hypertension   . Multiple sclerosis ([place])   .  Neuromuscular disorder ([place])    dx 2003 with MS  . Neuropathy   . PONV (postoperative nausea and  vomiting)    sometimes...........  Marland Kitchen Vision abnormalities     PAST SURGICAL HISTORY: Past Surgical History:  Procedure Laterality Date  . CHOLECYSTECTOMY    . DENTAL SURGERY    . LUMBAR LAMINECTOMY WITH COFLEX 1 LEVEL Bilateral 06/29/2015   Procedure: LUMBAR LAMINECTOMY WITH COFLEX 1 LEVEL;  Surgeon: Karie Chimera, MD;  Location: [facility];  Service: Neurosurgery;  Laterality: Bilateral;  LUMBAR LAMINECTOMY WITH COFLEX 1 LEVEL  . right foot surgery     remove a piece of glass  (had c/o for about 1 yr)  . TONSILLECTOMY    . TUBAL LIGATION    . WOUND EXPLORATION N/A 07/22/2015   Procedure: WOUND EXPLORATION;  Surgeon: Karie Chimera, MD;  Location: [facility];  Service: Neurosurgery;  Laterality: N/A;    FAMILY HISTORY: Family History  Problem Relation Age of Onset  . Hypertension Mother   . Diabetes Mother   . Stroke Mother   . Heart attack Mother   . Peripheral vascular disease Mother     SOCIAL HISTORY:  Social History   Socioeconomic History  . Marital status: Married    Spouse name: Not on file  . Number of children: Not on file  . Years of education: Not on file  . Highest education level: Not on file  Occupational History  . Not on file  Tobacco Use  . Smoking status: Current Every Day Smoker    Packs/day: 1.00    Years: 24.00    Pack years: 24.00    Types: Cigarettes  . Smokeless tobacco: Never Used  Substance and Sexual Activity  . Alcohol use: Yes    Alcohol/week: 0.0 standard drinks    Comment: occasional  . Drug use: No  . Sexual activity: Not on file  Other Topics Concern  . Not on file  Social History Narrative  . Not on file   Social Determinants of Health   Financial Resource Strain:   . Difficulty of Paying Living Expenses:   Food Insecurity:   . Worried About Charity fundraiser in the Last Year:   . Arboriculturist in the Last Year:   Transportation Needs:   . Film/video editor (Medical):   Marland Kitchen Lack of Transportation  (Non-Medical):   Physical Activity:   . Days of Exercise per Week:   . Minutes of Exercise per Session:   Stress:   . Feeling of Stress :   Social Connections:   . Frequency of Communication with Friends and Family:   . Frequency of Social Gatherings with Friends and Family:   . Attends Religious Services:   . Active Member of Clubs or Organizations:   . Attends Archivist Meetings:   Marland Kitchen Marital Status:   Intimate Partner Violence:   . Fear of Current or Ex-Partner:   . Emotionally Abused:   Marland Kitchen Physically Abused:   . Sexually Abused:      PHYSICAL EXAM  There were no vitals filed for this visit.  There is no height or weight on file to calculate BMI.   General: The patient is well-developed and well-nourished and in no acute distress   Lower Back: She has some tenderness in the lower lumbar paraspinal muscles.  Neurologic Exam  Mental status: She is alert and oriented. The affect is normal.. She has no difficulty with speech. Focus is  mildly reduced short-term memory seems appropriate.  Cranial nerves: Extraocular movements are full.  Facial strength and sensation is normal.  Trapezius strength is normal.  . Palatal elevation of protrusion is midline. Hearing is normal and symmetric.    Motor:  Muscle bulk and tone are normal. Strength was normal in the right arm and she had slight weakness 4+/5 in the ulnar innervated muscles of the left. Strength is 4+/5 in the toes. The left great toe has been surgically removed   Sensory: There is normal sensation in the arms and proximal legs  Coordination: Cerebellar testing reveals good finger-nose-finger but poor heel-to-shin bilaterally.  Gait and station: Station is wide.  Her gait is arthritic and mildly wide.  The tandem gait is poor.  Romberg is borderline.   Reflexes: Deep tendon reflexes are symmetric in her arms and legs (trace ankle)..   _________________________________  Multiple sclerosis  ([place])  Dysesthesia  Diabetic polyneuropathy associated with type 2 diabetes mellitus (HCC)  Chronic low back pain, unspecified back pain laterality, unspecified whether sciatica present  Gait disturbance  1.   She will need to restart any disease modifying therapy when relapses. 2.   Continue temazepam for insomnia and hydrocodone for pain (renew) 3.   Stay active and exercise as tolerated. 4.   Continue to use the shower chair  5.   rtc 6 months, sooner if problems  Shaquanta Harkless A. Felecia Shelling, MD, PhD [id number], [id number] PM Certified in Neurology, Clinical Neurophysiology, Sleep Medicine, Pain Medicine and Neuroimaging  [facility] [address] [place], [place] [address] [phone]

## 2020-05-17 ENCOUNTER — Telehealth: Payer: Self-pay | Admitting: Neurology

## 2020-05-17 MED ORDER — CYCLOBENZAPRINE HCL 10 MG PO TABS: 10.0000 mg | ORAL_TABLET | Freq: Three times a day (TID) | ORAL | 11 refills | Status: DC | PRN

## 2020-05-17 NOTE — Telephone Encounter (Signed)
Called pt back. She is still having severe back pain. She tried increased gabapentin 800mg  to QID from TID per MD recommendation but this was ineffective. She also takes hydrocodone 10-325mg  1 po BID prn. Confirmed she is taking baclofen 20mg  po TID. She is no longer taking cyclobenzaprine. She is wondering if Dr. Felecia Shelling will switch her back to cyclobenzaprine at this point. Advised I will send message to MD and call her back tomorrow at the latest with recommendation.

## 2020-05-17 NOTE — Telephone Encounter (Signed)
Called pt back. Relayed Dr. Garth Bigness message. She is agreeable to this plan. I e-scribed rx cyclobenzaprine and asked that baclofen be d/c'd.

## 2020-05-17 NOTE — Telephone Encounter (Signed)
We can send in cyclobenzaprine 10 mg po tid  #90.  If pain persists we can refer to a pain management center

## 2020-05-17 NOTE — Telephone Encounter (Signed)
Pt called wanting RN to call her to discuss changes she is needing in her medications. Please advise.

## 2020-06-03 ENCOUNTER — Other Ambulatory Visit: Payer: Self-pay | Admitting: Neurology

## 2020-06-03 MED ORDER — HYDROCODONE-ACETAMINOPHEN 10-325 MG PO TABS: ORAL_TABLET | ORAL | 0 refills | Status: DC

## 2020-06-03 NOTE — Telephone Encounter (Signed)
Pt has called for a refill on her HYDROcodone-acetaminophen (NORCO) 10-325 MG tablet to [id number]  

## 2020-06-03 NOTE — Addendum Note (Signed)
Addended by: Wyvonnia Lora on: 06/03/2020 01:50 PM   Modules accepted: Orders

## 2020-06-08 ENCOUNTER — Telehealth: Payer: Self-pay | Admitting: Neurology

## 2020-06-08 NOTE — Telephone Encounter (Signed)
I called the pt back. She reports a decrease in balance and increased fatigue through out the day and wanted to schedule f/u with Dr. Felecia Shelling to discuss further. Pt was scheduled for 06/21/2020 at 3 pm. Pt was offered slot for tomorrow but she advised she is going to have 7 teeth removed today and will need tomorrow to rest.

## 2020-06-08 NOTE — Telephone Encounter (Signed)
Patient paged the on call, no reason given. Please call back thanks

## 2020-06-21 ENCOUNTER — Ambulatory Visit: Payer: Self-pay | Admitting: Neurology

## 2020-07-01 ENCOUNTER — Other Ambulatory Visit: Payer: Self-pay

## 2020-07-01 MED ORDER — HYDROCODONE-ACETAMINOPHEN 10-325 MG PO TABS: ORAL_TABLET | ORAL | 0 refills | Status: DC

## 2020-07-01 NOTE — Telephone Encounter (Signed)
Angela Solomon is a 44 y.o. female would like her Hydrocodone- Acetaminophen refilled

## 2020-07-08 ENCOUNTER — Other Ambulatory Visit: Payer: Self-pay | Admitting: Neurology

## 2020-07-11 ENCOUNTER — Other Ambulatory Visit: Payer: Self-pay | Admitting: Neurology

## 2020-07-12 ENCOUNTER — Telehealth: Payer: Self-pay | Admitting: Neurology

## 2020-07-12 NOTE — Telephone Encounter (Signed)
Pt is needing a refill on her temazepam (RESTORIL) 15 MG capsule sent in to the [facility] on [address] In [place]

## 2020-07-12 NOTE — Telephone Encounter (Signed)
Refill already sent to MD for approval. It is pending currently.

## 2020-08-03 ENCOUNTER — Other Ambulatory Visit: Payer: Self-pay | Admitting: Neurology

## 2020-08-03 MED ORDER — HYDROCODONE-ACETAMINOPHEN 10-325 MG PO TABS: ORAL_TABLET | ORAL | 0 refills | Status: DC

## 2020-08-03 NOTE — Telephone Encounter (Signed)
Pt is requesting a refill for HYDROcodone-acetaminophen (NORCO) 10-325 MG tablet.  Pharmacy:  [id number]   

## 2020-08-03 NOTE — Addendum Note (Signed)
Addended by: Wyvonnia Lora on: 08/03/2020 09:34 AM   Modules accepted: Orders

## 2020-08-04 DIAGNOSIS — R809 Proteinuria, unspecified: Secondary | ICD-10-CM | POA: Insufficient documentation

## 2020-08-05 ENCOUNTER — Other Ambulatory Visit: Payer: Self-pay | Admitting: Neurology

## 2020-08-12 DIAGNOSIS — J449 Chronic obstructive pulmonary disease, unspecified: Secondary | ICD-10-CM | POA: Insufficient documentation

## 2020-08-12 DIAGNOSIS — E113299 Type 2 diabetes mellitus with mild nonproliferative diabetic retinopathy without macular edema, unspecified eye: Secondary | ICD-10-CM | POA: Insufficient documentation

## 2020-08-26 ENCOUNTER — Other Ambulatory Visit: Payer: Self-pay | Admitting: Physician Assistant

## 2020-08-26 DIAGNOSIS — R9389 Abnormal findings on diagnostic imaging of other specified body structures: Secondary | ICD-10-CM

## 2020-09-01 ENCOUNTER — Other Ambulatory Visit: Payer: Self-pay | Admitting: Neurology

## 2020-09-01 MED ORDER — HYDROCODONE-ACETAMINOPHEN 10-325 MG PO TABS: ORAL_TABLET | ORAL | 0 refills | Status: DC

## 2020-09-01 NOTE — Telephone Encounter (Signed)
Pt request refill HYDROcodone-acetaminophen (NORCO) 10-325 MG tablet at [id number] 

## 2020-09-06 ENCOUNTER — Ambulatory Visit: Payer: Self-pay | Admitting: Neurology

## 2020-09-08 ENCOUNTER — Other Ambulatory Visit: Payer: Medicare Other

## 2020-09-24 ENCOUNTER — Ambulatory Visit
Admission: RE | Admit: 2020-09-24 | Discharge: 2020-09-24 | Disposition: A | Discharge status: Home / Self Care | Payer: Medicare Other | Source: Ambulatory Visit | Attending: Physician Assistant | Admitting: Physician Assistant

## 2020-09-24 ENCOUNTER — Other Ambulatory Visit: Payer: Self-pay

## 2020-09-24 DIAGNOSIS — R9389 Abnormal findings on diagnostic imaging of other specified body structures: Secondary | ICD-10-CM

## 2020-09-24 MED ORDER — GADOBUTROL 1 MMOL/ML IV SOLN
7.0000 mL | Freq: Once | INTRAVENOUS | Status: AC | PRN
  Administered 2020-09-24: 7 mL via INTRAVENOUS

## 2020-09-30 ENCOUNTER — Other Ambulatory Visit: Payer: Self-pay | Admitting: *Deleted

## 2020-09-30 MED ORDER — HYDROCODONE-ACETAMINOPHEN 10-325 MG PO TABS
ORAL_TABLET | ORAL | 0 refills | Status: DC
Start: 2020-09-30 — End: 2020-11-01

## 2020-09-30 NOTE — Telephone Encounter (Signed)
Called pt to see if she could come in today at 330pm with Dr. Felecia Shelling since he had cx and she was on wait list. Pt declined, she did not have transportation. While on the phone, she requested refill on hydrocodone. Checked drug registry and she last refilled 09/01/20 #60. She last was seen 05/05/20 and next f/u 12/09/20.

## 2020-10-18 DIAGNOSIS — D241 Benign neoplasm of right breast: Secondary | ICD-10-CM | POA: Insufficient documentation

## 2020-10-26 ENCOUNTER — Telehealth: Payer: Self-pay | Admitting: Neurology

## 2020-10-26 NOTE — Telephone Encounter (Signed)
If no new symptoms no need to move appt

## 2020-10-26 NOTE — Telephone Encounter (Signed)
Called and LVM for pt relaying Dr. Garth Bigness message. Advised her to call back if she has any further questions/concerns.

## 2020-10-26 NOTE — Telephone Encounter (Signed)
Dr. Felecia Shelling- please advise. Did you want to fit her in for sooner appt for clearance?

## 2020-10-26 NOTE — Telephone Encounter (Signed)
Pt called, having hysterectomy surgery. Surgeon recommended check up with all my physician before surgery. I have an appt scheduled 12/16 with [facility], but need a sooner appt. Would like a call from the nurse.

## 2020-11-01 ENCOUNTER — Other Ambulatory Visit: Payer: Self-pay | Admitting: Neurology

## 2020-11-01 MED ORDER — HYDROCODONE-ACETAMINOPHEN 10-325 MG PO TABS
ORAL_TABLET | ORAL | 0 refills | Status: DC
Start: 2020-11-01 — End: 2020-12-06

## 2020-11-01 NOTE — Addendum Note (Signed)
Addended by: Wyvonnia Lora on: 11/01/2020 09:32 AM   Modules accepted: Orders

## 2020-11-01 NOTE — Telephone Encounter (Signed)
Pt is requesting a refill for HYDROcodone-acetaminophen (NORCO) 10-325 MG tablet.  Pharmacy:  [id number]   

## 2020-11-09 ENCOUNTER — Other Ambulatory Visit: Payer: Self-pay | Admitting: *Deleted

## 2020-11-11 ENCOUNTER — Other Ambulatory Visit: Payer: Self-pay | Admitting: *Deleted

## 2020-11-11 MED ORDER — AMITRIPTYLINE HCL 75 MG PO TABS
75.0000 mg | ORAL_TABLET | Freq: Every day | ORAL | 5 refills | Status: DC
Start: 2020-11-11 — End: 2021-06-07

## 2020-12-03 ENCOUNTER — Other Ambulatory Visit: Payer: Self-pay | Admitting: Neurology

## 2020-12-03 NOTE — Telephone Encounter (Signed)
Pt request refill HYDROcodone-acetaminophen (NORCO) 10-325 MG tablet at [facility] #[id number]

## 2020-12-06 MED ORDER — HYDROCODONE-ACETAMINOPHEN 10-325 MG PO TABS
ORAL_TABLET | ORAL | 0 refills | Status: DC
Start: 2020-12-06 — End: 2021-01-04

## 2020-12-07 ENCOUNTER — Other Ambulatory Visit: Payer: Self-pay | Admitting: Neurology

## 2020-12-09 ENCOUNTER — Ambulatory Visit: Payer: Self-pay | Admitting: Neurology

## 2020-12-11 ENCOUNTER — Telehealth: Payer: Self-pay | Admitting: Neurology

## 2020-12-11 NOTE — Telephone Encounter (Signed)
Patient called after hours call service with a message of trouble talking,  shortness of breath.   I called patient, she had no difficulty with comprehension but was at times hard to understand. She reported, that she recently had a hysterectomy at [facility] and then went back to the hospital. She was treated with potassium and for bowel obstruction.  I reviewed the chart.  She was recently hospitalized for small bowel obstruction.  She reports that she has had trouble speaking since Monday of this week.  She reports that she is able to walk.  She was requesting advised if she is having a stroke.  I advised patient to get checked out immediately by going to the emergency room, have someone take her or call 911 for an ambulance.  I advised patient that it is difficult to tell what could be going on, given recent hospitalizations, but it is my recommendation that she seek immediate medical attention.  She was advised that I would let her primary neurologist know about our conversation and my recommendation.  She voiced understanding and agreement.

## 2020-12-13 NOTE — Telephone Encounter (Signed)
Dr. Felecia Shelling- pt emailed and said she was in the hospital. I responded letting her know she should be evaluated and treated there. If they feel she should f/u with our office, I told her to let us know and we can work in her somewhere.

## 2021-01-04 ENCOUNTER — Telehealth: Payer: Self-pay | Admitting: Neurology

## 2021-01-04 MED ORDER — HYDROCODONE-ACETAMINOPHEN 10-325 MG PO TABS
ORAL_TABLET | ORAL | 0 refills | Status: DC
Start: 2021-01-04 — End: 2021-02-03

## 2021-01-04 NOTE — Telephone Encounter (Signed)
Pt. requests a refill for HYDROcodone-acetaminophen (NORCO) 10-325 MG tablet.  Pharmacy: [facility] [facility] [phone]

## 2021-01-04 NOTE — Addendum Note (Signed)
Addended by: Britt Bottom on: 01/04/2021 01:33 PM   Modules accepted: Orders

## 2021-02-03 ENCOUNTER — Other Ambulatory Visit: Payer: Self-pay | Admitting: Neurology

## 2021-02-03 MED ORDER — HYDROCODONE-ACETAMINOPHEN 10-325 MG PO TABS: ORAL_TABLET | ORAL | 0 refills | Status: DC

## 2021-02-03 NOTE — Addendum Note (Signed)
Addended by: Wyvonnia Lora on: 02/03/2021 02:55 PM   Modules accepted: Orders

## 2021-02-03 NOTE — Telephone Encounter (Signed)
Pt has called for a refill on her HYDROcodone-acetaminophen (NORCO) 10-325 MG tablet to [facility] #[id number]

## 2021-02-07 ENCOUNTER — Other Ambulatory Visit: Payer: Self-pay | Admitting: Neurology

## 2021-03-07 ENCOUNTER — Other Ambulatory Visit: Payer: Self-pay | Admitting: Neurology

## 2021-03-07 MED ORDER — HYDROCODONE-ACETAMINOPHEN 10-325 MG PO TABS
ORAL_TABLET | ORAL | 0 refills | Status: DC
Start: 2021-03-07 — End: 2021-04-07

## 2021-03-07 NOTE — Telephone Encounter (Signed)
Pt called needing a refill on her HYDROcodone-acetaminophen (NORCO) 10-325 MG tablet sent in to the [facility] on [address]

## 2021-03-28 DIAGNOSIS — M86471 Chronic osteomyelitis with draining sinus, right ankle and foot: Secondary | ICD-10-CM | POA: Insufficient documentation

## 2021-04-06 ENCOUNTER — Other Ambulatory Visit: Payer: Self-pay | Admitting: Neurology

## 2021-04-07 ENCOUNTER — Other Ambulatory Visit: Payer: Self-pay | Admitting: Neurology

## 2021-04-07 MED ORDER — HYDROCODONE-ACETAMINOPHEN 10-325 MG PO TABS: ORAL_TABLET | ORAL | 0 refills | Status: DC

## 2021-04-07 NOTE — Telephone Encounter (Signed)
Pt request refill HYDROcodone-acetaminophen (NORCO) 10-325 MG tablet at [facility] #[id number]

## 2021-05-05 ENCOUNTER — Other Ambulatory Visit: Payer: Self-pay | Admitting: Neurology

## 2021-05-05 MED ORDER — HYDROCODONE-ACETAMINOPHEN 10-325 MG PO TABS: ORAL_TABLET | ORAL | 0 refills | Status: DC

## 2021-05-05 NOTE — Telephone Encounter (Signed)
Pt is asking for a refill on her HYDROcodone-acetaminophen (NORCO) 10-325 MG tablet to [facility] #[id number]

## 2021-05-05 NOTE — Addendum Note (Signed)
Addended by: Wyvonnia Lora on: 05/05/2021 01:31 PM   Modules accepted: Orders

## 2021-05-06 ENCOUNTER — Other Ambulatory Visit: Payer: Self-pay | Admitting: Neurology

## 2021-05-25 ENCOUNTER — Other Ambulatory Visit: Payer: Self-pay

## 2021-05-25 ENCOUNTER — Ambulatory Visit (INDEPENDENT_AMBULATORY_CARE_PROVIDER_SITE_OTHER): Payer: Medicare Other | Admitting: Neurology

## 2021-05-25 ENCOUNTER — Encounter: Payer: Self-pay | Admitting: Neurology

## 2021-05-25 VITALS — BP 150/92 | HR 83 | Ht 72.0 in | Wt 254.5 lb

## 2021-05-25 DIAGNOSIS — M545 Low back pain, unspecified: Secondary | ICD-10-CM | POA: Diagnosis not present

## 2021-05-25 DIAGNOSIS — E1142 Type 2 diabetes mellitus with diabetic polyneuropathy: Secondary | ICD-10-CM

## 2021-05-25 DIAGNOSIS — G35 Multiple sclerosis: Secondary | ICD-10-CM | POA: Diagnosis not present

## 2021-05-25 DIAGNOSIS — R208 Other disturbances of skin sensation: Secondary | ICD-10-CM | POA: Diagnosis not present

## 2021-05-25 DIAGNOSIS — G4700 Insomnia, unspecified: Secondary | ICD-10-CM

## 2021-05-25 DIAGNOSIS — G8929 Other chronic pain: Secondary | ICD-10-CM

## 2021-05-25 NOTE — Progress Notes (Signed)
[facility]  PATIENT: Angela Solomon DOB: 08/10/1976  REFERRING CLINICIAN: Dr. Rita Ohara  HISTORY FROM: patient REASON FOR VISIT: MS   HISTORICAL  CHIEF COMPLAINT:  Chief Complaint  Patient presents with  . Multiple Sclerosis    HISTORY OF PRESENT ILLNESS:   Angela Solomon is a 45 y.o. woman with relapsing remitting multiple sclerosis and back pain.   Update 05/25/2021: She was on Avonex but has not had lately due to other medical issues.  No recent exacerbation.   She is noting more headaches and continued back pain.    She has IDDM.  She has sores in the right foot, heel and ankles.  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   She sees wound care regularly.     She has been on antobiotics  She is reporting more lower back pain worse when she has a more active day.   Pian is on the left side more than right.    She takes hydrocodone 10 mg bid.   She takes 800 mg gabapentin po tid.   She was on Flexeril and feels it helps her better than the baclofen.     She will sometimes take a hydrocodone when she gets a headache as well.   She is also on Cymbalta.    She has problems with her gait but is likely more from her pain (foot and back) more than MS. She notes reduced short term memory.  She reports urinary urgency but no incontinence.      She has lost 11 pounds this year.      MS History:   She presented in 2003 with gait ataxia, slurred speech and right sided paresthesias. Imaging studies were performed and she was diagnosed with multiple sclerosis. Initially, she was placed on Rebif but had skin reactions and switched to Copaxone. On Copaxone, she had an immediate hypersensitivity response and it was discontinued. She has been on Avonex most of the time but has been off for months at a time when she has had insurance difficulties.      Review of MRI's: MRI Brain 04/24/2016 showed T2/FLAIR hyperintense foci, predominantly in the periventricular white matter in a  pattern and configuration consistent with chronic demyelinating plaque associated with multiple sclerosis. None of the foci enhances after gadolinium administration.  There are no acute findings.   There is no change when compared to the MRI dated 09/28/2013.  MRI cervical spine 04/24/2016 showed Multiple foci within the cervical spinal cord consistent with chronic demyelinating plaque associated with multiple sclerosis. None of the foci enhances after contrast administration. There is no change when compared to the MRI dated 05/20/2014.  Mild degenerative changes at C4-C5, C5-C6 and C7-T1 that did not lead to nerve root impingement.  MRI of the cervical spine dated 05/20/2014. It showed enhancing spinal cord lesions consistent with MS. C2-C3 there was a left lateral focus. Also at C2-C3 there was a posterior focus. At C3-C4 there was a right posterior lateral focus. At C4 there was a dorsal focus and at C5 there was another dorsal focus. 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 There are multilevel degenerative changes most pronounced at C7-T1 where there could be dynamic impingement of the right C8 nerve root.   MRI of the brain dated 09/28/2013 showing diffuse white matter foci in the periventricular and deep white matter consistent with [place].  Although there were no enhancing foci, when compared to a previous MRI dated 10/11/2010,  there was some progression.   MRI of the lumbar spine dated 04/27/2015 showed a large disc extrusion with sequestered fragment causing bilateral L5 nerve root compression.    MRI of the brain with and without contrast dated 10/16/2008 showed enhancing lesions.  She was reportedly off medication at that time.   REVIEW OF SYSTEMS:  Constitutional: No fevers, chills, sweats, or change in appetite.   Fatigue, poor sleep Eyes: No visual changes, double vision, eye pain Ear, nose and throat: No hearing loss, ear pain, nasal congestion, sore throat Cardiovascular: No chest  pain, palpitations Respiratory:  No shortness of breath at rest or with exertion.   No wheezes GastrointestinaI: No nausea, vomiting, diarrhea, abdominal pain, fecal incontinence Genitourinary:  No dysuria, urinary retention or frequency.  No nocturia. Musculoskeletal:  as above. Integumentary: No rash, pruritus, skin lesions Neurological: as above Psychiatric: No depression at this time.  No anxiety Endocrine: No palpitations, diaphoresis, change in appetite, change in weigh or increased thirst Hematologic/Lymphatic:  No anemia, purpura, petechiae. Allergic/Immunologic: No itchy/runny eyes, nasal congestion, recent allergic reactions, rashes  ALLERGIES: Allergies  Allergen Reactions  . Atorvastatin Other (See Comments)    Myalgia Myalgia  . Glatiramer Anaphylaxis    Generic Copaxone  . Lisinopril Anaphylaxis  . Niacin Diarrhea  . Valproic Acid Anaphylaxis  . Buprenorphine Hcl Hives  . Morphine And Related Hives  . Aspirin Nausea And Vomiting    Fevers and GI upset  . Hydrocodone-Acetaminophen Nausea And Vomiting    Pt states can take Hydrocodone but has a reaction to a additive in vicodin  . Zithromax [Azithromycin] Rash    HOME MEDICATIONS: Outpatient Medications Prior to Visit  Medication Sig Dispense Refill  . acetaminophen (TYLENOL) 500 MG tablet Take 1,000 mg by mouth every 6 (six) hours as needed for mild pain or moderate pain.    Marland Kitchen albuterol (PROVENTIL HFA;VENTOLIN HFA) 108 (90 BASE) MCG/ACT inhaler Inhale 1 puff into the lungs as needed for wheezing or shortness of breath. Reported on 04/06/2016    . amitriptyline (ELAVIL) 75 MG tablet Take 1 tablet (75 mg total) by mouth at bedtime. 30 tablet 5  . amoxicillin-clavulanate (AUGMENTIN) 875-125 MG tablet Take 1 tablet by mouth 2 (two) times daily.    . B-D INS SYR ULTRAFINE 1CC/31G 31G X 5/16" 1 ML MISC See admin instructions.  0  . carvedilol (COREG) 25 MG tablet Take 25 mg by mouth 2 (two) times daily.     .  cyclobenzaprine (FLEXERIL) 10 MG tablet Take 1 tablet (10 mg total) by mouth 3 (three) times daily as needed for muscle spasms. 90 tablet 11  . DULoxetine (CYMBALTA) 60 MG capsule Take 60 mg by mouth daily.    . empagliflozin (JARDIANCE) 10 MG TABS tablet Take 25 mg by mouth daily.     . ferrous sulfate 325 (65 FE) MG tablet Take 325 mg by mouth daily.     . fluconazole (DIFLUCAN) 150 MG tablet as needed.   0  . fluticasone (FLONASE) 50 MCG/ACT nasal spray Place 2 sprays into both nostrils as needed for allergies.     Marland Kitchen gabapentin (NEURONTIN) 800 MG tablet Take 1 tablet (800 mg total) by mouth in the morning, at noon, in the evening, and at bedtime. 90 tablet 11  . HYDROcodone-acetaminophen (NORCO) 10-325 MG tablet Take 1 tablet by mouth twice daily as needed. Must keep follow up 05/25/21 for ongoing refills 60 tablet 0  . insulin glargine (LANTUS) 100 UNIT/ML injection Inject 50 Units  into the skin 2 (two) times daily.     . interferon beta-1a (AVONEX) 30 MCG injection Inject 30 mcg into the muscle every 7 (seven) days. 12 each 3  . losartan-hydrochlorothiazide (HYZAAR) 100-25 MG per tablet Take 1 tablet by mouth daily.     . minocycline (DYNACIN) 100 MG tablet Take 100 mg by mouth 2 (two) times daily.    . pantoprazole (PROTONIX) 40 MG tablet Take 40 mg by mouth at bedtime.     . promethazine (PHENERGAN) 25 MG tablet Take 1 tablet (25 mg total) by mouth every 6 (six) hours as needed for nausea or vomiting. 30 tablet 1  . temazepam (RESTORIL) 15 MG capsule TAKE 1 CAPSULE(15 MG) BY MOUTH AT BEDTIME AS NEEDED FOR SLEEP 30 capsule 5  . traZODone (DESYREL) 100 MG tablet Take 1 tablet (100 mg total) by mouth at bedtime. Keep follow up 05/25/21 for future refills 30 tablet 1  . amLODipine (NORVASC) 5 MG tablet Take 5 mg by mouth.     No facility-administered medications prior to visit.    PAST MEDICAL HISTORY: Past Medical History:  Diagnosis Date  . Anemia   . Depression   . Diabetes mellitus  without complication (HCC)    dx at age 52  type 83  . GERD (gastroesophageal reflux disease)   . Headache   . Hypertension   . Multiple sclerosis ([place])   . Neuromuscular disorder ([place])    dx 2003 with MS  . Neuropathy   . PONV (postoperative nausea and vomiting)    sometimes...........  Marland Kitchen Vision abnormalities     PAST SURGICAL HISTORY: Past Surgical History:  Procedure Laterality Date  . CHOLECYSTECTOMY    . DENTAL SURGERY    . LUMBAR LAMINECTOMY WITH COFLEX 1 LEVEL Bilateral 06/29/2015   Procedure: LUMBAR LAMINECTOMY WITH COFLEX 1 LEVEL;  Surgeon: Karie Chimera, MD;  Location: [facility];  Service: Neurosurgery;  Laterality: Bilateral;  LUMBAR LAMINECTOMY WITH COFLEX 1 LEVEL  . right foot surgery     remove a piece of glass  (had c/o for about 1 yr)  . TONSILLECTOMY    . TUBAL LIGATION    . WOUND EXPLORATION N/A 07/22/2015   Procedure: WOUND EXPLORATION;  Surgeon: Karie Chimera, MD;  Location: [facility];  Service: Neurosurgery;  Laterality: N/A;    FAMILY HISTORY: Family History  Problem Relation Age of Onset  . Hypertension Mother   . Diabetes Mother   . Stroke Mother   . Heart attack Mother   . Peripheral vascular disease Mother     SOCIAL HISTORY:  Social History   Socioeconomic History  . Marital status: Married    Spouse name: Not on file  . Number of children: Not on file  . Years of education: Not on file  . Highest education level: Not on file  Occupational History  . Not on file  Tobacco Use  . Smoking status: Current Every Day Smoker    Packs/day: 0.50    Years: 24.00    Pack years: 12.00    Types: Cigarettes  . Smokeless tobacco: Never Used  Substance and Sexual Activity  . Alcohol use: Not Currently    Alcohol/week: 0.0 standard drinks    Comment: occasional  . Drug use: No  . Sexual activity: Not on file  Other Topics Concern  . Not on file  Social History Narrative  . Not on file   Social Determinants of Health   Financial  Resource Strain: Not  on file  Food Insecurity: Not on file  Transportation Needs: Not on file  Physical Activity: Not on file  Stress: Not on file  Social Connections: Not on file  Intimate Partner Violence: Not on file     PHYSICAL EXAM  Vitals:   05/25/21 1532  BP: (!) 150/92  Pulse: 83  Weight: 254 lb 8 oz (115.4 kg)  Height: 6' (1.829 m)    Body mass index is 34.52 kg/m.   General: The patient is well-developed and well-nourished and in no acute distress   Lower Back: She has some tenderness in the lower lumbar paraspinal muscles.  Neurologic Exam  Mental status: She is alert and oriented. The affect is normal.. She has no difficulty with speech. Focus is mildly reduced short-term memory seems appropriate.  Cranial nerves: Extraocular movements are full.  Facial strength and sensation is normal.  Trapezius strength is normal.  . Palatal elevation of protrusion is midline. Hearing is normal and symmetric.    Motor:  Muscle bulk and tone are normal. Strength was normal in the right arm and she had slight weakness 4+/5 in the ulnar innervated muscles of the left. Strength is 4+/5 in the toes. The left great toe has been surgically removed   Sensory: She reports reduced sensation to vibration in the right arm and leg relative to the left.  Coordination: Cerebellar testing reveals good finger-nose-finger but poor heel-to-shin bilaterally.  Gait and station: Station is wide.  Gait is difficult to assess due to her right boot and recent surgery.    Reflexes: Deep tendon reflexes are symmetric in her arms and legs (trace ankle)..   _________________________________  Multiple sclerosis ([place])  Dysesthesia  Diabetic polyneuropathy associated with type 2 diabetes mellitus (HCC)  Chronic low back pain, unspecified back pain laterality, unspecified whether sciatica present  Insomnia, unspecified type  1.   For now she will remain off of a disease modifying therapy.  She  will need to reconsider if relapses. 2.   Continue temazepam for insomnia and hydrocodone for pain. 3.   A few medications overlap- amitriptyline, Cymbalta, trazodone and cyclobenzaprine.    We will stop the trazodone.   4.   Stay active and exercise as tolerated. 5.   Continue to use the shower chair  6.   rtc 6 months, sooner if problems  Isamar Nazir A. Felecia Shelling, MD, PhD 01/30/36, 0:48 PM Certified in Neurology, Clinical Neurophysiology, Sleep Medicine, Pain Medicine and Neuroimaging  [facility] [address], [place], [place] [address] [phone]

## 2021-05-26 ENCOUNTER — Other Ambulatory Visit: Payer: Self-pay | Admitting: Emergency Medicine

## 2021-05-26 MED ORDER — GABAPENTIN 800 MG PO TABS
800.0000 mg | ORAL_TABLET | Freq: Four times a day (QID) | ORAL | 11 refills | Status: DC
Start: 2021-05-26 — End: 2021-05-30

## 2021-05-27 ENCOUNTER — Other Ambulatory Visit: Payer: Self-pay | Admitting: Neurology

## 2021-05-30 ENCOUNTER — Other Ambulatory Visit: Payer: Self-pay | Admitting: *Deleted

## 2021-05-30 MED ORDER — GABAPENTIN 800 MG PO TABS: 800.0000 mg | ORAL_TABLET | Freq: Four times a day (QID) | ORAL | 11 refills | Status: DC

## 2021-06-05 ENCOUNTER — Other Ambulatory Visit: Payer: Self-pay | Admitting: Neurology

## 2021-06-06 ENCOUNTER — Other Ambulatory Visit: Payer: Self-pay | Admitting: Neurology

## 2021-06-06 MED ORDER — HYDROCODONE-ACETAMINOPHEN 10-325 MG PO TABS: ORAL_TABLET | ORAL | 0 refills | Status: DC

## 2021-06-06 NOTE — Telephone Encounter (Signed)
Pt request refill HYDROcodone-acetaminophen (NORCO) 10-325 MG tablet at [facility] #[id number]

## 2021-06-07 ENCOUNTER — Other Ambulatory Visit: Payer: Self-pay | Admitting: *Deleted

## 2021-07-06 ENCOUNTER — Other Ambulatory Visit: Payer: Self-pay | Admitting: Neurology

## 2021-07-06 MED ORDER — HYDROCODONE-ACETAMINOPHEN 10-325 MG PO TABS: ORAL_TABLET | ORAL | 0 refills | Status: DC

## 2021-07-06 NOTE — Telephone Encounter (Signed)
Pt is requesting a refill for HYDROcodone-acetaminophen (NORCO) 10-325 MG tablet.  Pharmacy:  [id number]   

## 2021-07-07 DIAGNOSIS — Z794 Long term (current) use of insulin: Secondary | ICD-10-CM | POA: Insufficient documentation

## 2021-08-05 ENCOUNTER — Other Ambulatory Visit: Payer: Self-pay | Admitting: Neurology

## 2021-08-05 NOTE — Telephone Encounter (Signed)
Pt is needing a refill on her HYDROcodone-acetaminophen (NORCO) 10-325 MG tablet sent to the [facility]

## 2021-08-08 MED ORDER — HYDROCODONE-ACETAMINOPHEN 10-325 MG PO TABS: ORAL_TABLET | ORAL | 0 refills | Status: DC

## 2021-08-23 ENCOUNTER — Other Ambulatory Visit: Payer: Self-pay

## 2021-08-23 DIAGNOSIS — G43809 Other migraine, not intractable, without status migrainosus: Secondary | ICD-10-CM

## 2021-08-23 DIAGNOSIS — G8929 Other chronic pain: Secondary | ICD-10-CM

## 2021-08-23 DIAGNOSIS — G35 Multiple sclerosis: Secondary | ICD-10-CM

## 2021-09-06 ENCOUNTER — Other Ambulatory Visit: Payer: Self-pay | Admitting: Neurology

## 2021-09-06 MED ORDER — HYDROCODONE-ACETAMINOPHEN 10-325 MG PO TABS: ORAL_TABLET | ORAL | 0 refills | Status: DC

## 2021-09-06 NOTE — Telephone Encounter (Signed)
Pt called requesting refill for HYDROcodone-acetaminophen (NORCO) 10-325 MG tablet. [facility] [phone].

## 2021-09-08 ENCOUNTER — Other Ambulatory Visit: Payer: Self-pay | Admitting: Neurology

## 2021-09-12 ENCOUNTER — Telehealth: Payer: Self-pay | Admitting: Neurology

## 2021-09-12 NOTE — Telephone Encounter (Signed)
Pt called requesting refill for temazepam (RESTORIL) 15 MG capsule. [place] [phone].

## 2021-09-12 NOTE — Telephone Encounter (Signed)
Refill has been completed and waiting on Dr Felecia Shelling to sign. Once signed will fax to the pharmacy for the pt

## 2021-09-13 DIAGNOSIS — N182 Chronic kidney disease, stage 2 (mild): Secondary | ICD-10-CM | POA: Insufficient documentation

## 2021-10-01 ENCOUNTER — Other Ambulatory Visit: Payer: Self-pay | Admitting: Neurology

## 2021-10-03 ENCOUNTER — Telehealth: Payer: Self-pay | Admitting: Neurology

## 2021-10-03 NOTE — Telephone Encounter (Signed)
Pt called requesting refill for gabapentin (NEURONTIN) 800 MG tablet. [place] [phone].

## 2021-10-03 NOTE — Telephone Encounter (Signed)
Rx e-scribed to pharmacy.

## 2021-10-05 ENCOUNTER — Other Ambulatory Visit: Payer: Self-pay | Admitting: Neurology

## 2021-10-05 MED ORDER — HYDROCODONE-ACETAMINOPHEN 10-325 MG PO TABS: ORAL_TABLET | ORAL | 0 refills | Status: DC

## 2021-10-05 NOTE — Telephone Encounter (Signed)
Pt request refill for HYDROcodone-acetaminophen (NORCO) 10-325 MG tablet at [facility] #[id number]

## 2021-11-02 ENCOUNTER — Ambulatory Visit: Payer: Medicare Other | Admitting: Family Medicine

## 2021-11-02 ENCOUNTER — Other Ambulatory Visit: Payer: Self-pay | Admitting: Family Medicine

## 2021-11-02 MED ORDER — HYDROCODONE-ACETAMINOPHEN 10-325 MG PO TABS: ORAL_TABLET | ORAL | 0 refills | Status: DC

## 2021-11-02 NOTE — Telephone Encounter (Signed)
Patient had an apt today which was resch until January. Wants to make sure she has refills for hydrocodone. Best call back [phone]

## 2021-11-02 NOTE — Telephone Encounter (Signed)
Received refill request for hydrocodone.  Last OV was on 05/25/21.  Next OV is scheduled for 12/27/21 .  Last RX was written on 10/06/21 for 60 tabs.   [place] [facility] Database has been reviewed.

## 2021-12-02 ENCOUNTER — Other Ambulatory Visit: Payer: Self-pay | Admitting: Neurology

## 2021-12-02 NOTE — Telephone Encounter (Signed)
Pt is requesting a refill for HYDROcodone-acetaminophen (NORCO) 10-325 MG tablet.  Pharmacy:  [id number]   

## 2021-12-05 ENCOUNTER — Other Ambulatory Visit: Payer: Self-pay | Admitting: Neurology

## 2021-12-05 MED ORDER — HYDROCODONE-ACETAMINOPHEN 10-325 MG PO TABS: ORAL_TABLET | ORAL | 0 refills | Status: DC

## 2021-12-05 NOTE — Addendum Note (Signed)
Addended by: Darleen Crocker on: 12/05/2021 07:31 AM   Modules accepted: Orders

## 2021-12-05 NOTE — Telephone Encounter (Signed)
Pt requesting refill HYDROcodone-acetaminophen (NORCO) 10-325 MG tablet. [facility] [phone] - [place], [place] - [address].

## 2021-12-05 NOTE — Telephone Encounter (Signed)
I have routed this request to Dr Sater for review. The pt is due for the medication and [place] registry was verified.  

## 2021-12-12 ENCOUNTER — Other Ambulatory Visit: Payer: Self-pay | Admitting: Neurology

## 2021-12-15 NOTE — Patient Instructions (Incomplete)
Below is our plan:  We will continue current treatment plan  Please make sure you are staying well hydrated. I recommend 50-60 ounces daily. Well balanced diet and regular exercise encouraged. Consistent sleep schedule with 6-8 hours recommended.   Please continue follow up with care team as directed.   Follow up with Dr Sater in 4-6 months   You may receive a survey regarding today's visit. I encourage you to leave honest feed back as I do use this information to improve patient care. Thank you for seeing me today!    

## 2021-12-15 NOTE — Progress Notes (Deleted)
No chief complaint on file.    HISTORY OF PRESENT ILLNESS:  12/15/21 ALL:  Angela Solomon is a 45 y.o. female here today for follow up for RRMS. She was previously on Avonex but now not on DMT.   Hydrocodone 10/325mg  BID  gabapentin 800mg  TID  Cyclobenzaprine   Temazepam   Amitriptyline Duloxetine Trazodone was discontinued     HISTORY (copied from Dr Garth Bigness previous note)  Angela Solomon is a 45 y.o. woman with relapsing remitting multiple sclerosis and back pain.    Update 05/25/2021: She was on Avonex but has not had lately due to other medical issues.  No recent exacerbation.   She is noting more headaches and continued back pain.     She has IDDM.  She has sores in the right foot, heel and ankles.  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   She sees wound care regularly.     She has been on antobiotics   She is reporting more lower back pain worse when she has a more active day.   Pian is on the left side more than right.    She takes hydrocodone 10 mg bid.   She takes 800 mg gabapentin po tid.   She was on Flexeril and feels it helps her better than the baclofen.     She will sometimes take a hydrocodone when she gets a headache as well.   She is also on Cymbalta.     She has problems with her gait but is likely more from her pain (foot and back) more than MS. She notes reduced short term memory.  She reports urinary urgency but no incontinence.      She has lost 11 pounds this year.       MS History:   She presented in 2003 with gait ataxia, slurred speech and right sided paresthesias. Imaging studies were performed and she was diagnosed with multiple sclerosis. Initially, she was placed on Rebif but had skin reactions and switched to Copaxone. On Copaxone, she had an immediate hypersensitivity response and it was discontinued. She has been on Avonex most of the time but has been off for months at a time when she has had insurance difficulties.       Review of  MRI's: MRI Brain 04/24/2016 showed T2/FLAIR hyperintense foci, predominantly in the periventricular white matter in a pattern and configuration consistent with chronic demyelinating plaque associated with multiple sclerosis. None of the foci enhances after gadolinium administration.  There are no acute findings.   There is no change when compared to the MRI dated 09/28/2013.   MRI cervical spine 04/24/2016 showed Multiple foci within the cervical spinal cord consistent with chronic demyelinating plaque associated with multiple sclerosis. None of the foci enhances after contrast administration. There is no change when compared to the MRI dated 05/20/2014.  Mild degenerative changes at C4-C5, C5-C6 and C7-T1 that did not lead to nerve root impingement.   MRI of the cervical spine dated 05/20/2014. It showed enhancing spinal cord lesions consistent with MS. C2-C3 there was a left lateral focus. Also at C2-C3 there was a posterior focus. At C3-C4 there was a right posterior lateral focus. At C4 there was a dorsal focus and at C5 there was another dorsal focus. 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 There are multilevel degenerative changes most pronounced at C7-T1 where there could be dynamic impingement of the right C8 nerve root.    MRI of  the brain dated 09/28/2013 showing diffuse white matter foci in the periventricular and deep white matter consistent with [place].  Although there were no enhancing foci, when compared to a previous MRI dated 10/11/2010, there was some progression.    MRI of the lumbar spine dated 04/27/2015 showed a large disc extrusion with sequestered fragment causing bilateral L5 nerve root compression.     MRI of the brain with and without contrast dated 10/16/2008 showed enhancing lesions.  She was reportedly off medication at that time   REVIEW OF SYSTEMS: Out of a complete 14 system review of symptoms, the patient complains only of the following symptoms, and all other reviewed  systems are negative.   ALLERGIES: Allergies  Allergen Reactions   Atorvastatin Other (See Comments)    Myalgia Myalgia   Glatiramer Anaphylaxis    Generic Copaxone   Lisinopril Anaphylaxis   Niacin Diarrhea   Valproic Acid Anaphylaxis   Buprenorphine Hcl Hives   Morphine And Related Hives   Aspirin Nausea And Vomiting    Fevers and GI upset   Hydrocodone-Acetaminophen Nausea And Vomiting    Pt states can take Hydrocodone but has a reaction to a additive in vicodin   Zithromax [Azithromycin] Rash     HOME MEDICATIONS: Outpatient Medications Prior to Visit  Medication Sig Dispense Refill   acetaminophen (TYLENOL) 500 MG tablet Take 1,000 mg by mouth every 6 (six) hours as needed for mild pain or moderate pain.     albuterol (PROVENTIL HFA;VENTOLIN HFA) 108 (90 BASE) MCG/ACT inhaler Inhale 1 puff into the lungs as needed for wheezing or shortness of breath. Reported on 04/06/2016     amitriptyline (ELAVIL) 75 MG tablet TAKE 1 TABLET(75 MG) BY MOUTH AT BEDTIME 30 tablet 11   amLODipine (NORVASC) 5 MG tablet Take 5 mg by mouth.     amoxicillin-clavulanate (AUGMENTIN) 875-125 MG tablet Take 1 tablet by mouth 2 (two) times daily.     B-D INS SYR ULTRAFINE 1CC/31G 31G X 5/16" 1 ML MISC See admin instructions.  0   carvedilol (COREG) 25 MG tablet Take 25 mg by mouth 2 (two) times daily.      cyclobenzaprine (FLEXERIL) 10 MG tablet TAKE 1 TABLET(10 MG) BY MOUTH THREE TIMES DAILY AS NEEDED FOR MUSCLE SPASMS 90 tablet 11   DULoxetine (CYMBALTA) 60 MG capsule Take 60 mg by mouth daily.     empagliflozin (JARDIANCE) 10 MG TABS tablet Take 25 mg by mouth daily.      ferrous sulfate 325 (65 FE) MG tablet Take 325 mg by mouth daily.      fluconazole (DIFLUCAN) 150 MG tablet as needed.   0   fluticasone (FLONASE) 50 MCG/ACT nasal spray Place 2 sprays into both nostrils as needed for allergies.      gabapentin (NEURONTIN) 800 MG tablet TAKE 1 TABLET BY MOUTH IN THE MORNING, AT NOON, IN THE  EVENING AND AT BEDTIME 90 tablet 2   HYDROcodone-acetaminophen (NORCO) 10-325 MG tablet Take 1 tablet by mouth twice daily as needed. Must keep follow up appt (12/27/21) for continued refills. 60 tablet 0   insulin glargine (LANTUS) 100 UNIT/ML injection Inject 50 Units into the skin 2 (two) times daily.      interferon beta-1a (AVONEX) 30 MCG injection Inject 30 mcg into the muscle every 7 (seven) days. 12 each 3   losartan-hydrochlorothiazide (HYZAAR) 100-25 MG per tablet Take 1 tablet by mouth daily.      minocycline (DYNACIN) 100 MG tablet Take 100 mg  by mouth 2 (two) times daily.     pantoprazole (PROTONIX) 40 MG tablet Take 40 mg by mouth at bedtime.      promethazine (PHENERGAN) 25 MG tablet Take 1 tablet (25 mg total) by mouth every 6 (six) hours as needed for nausea or vomiting. 30 tablet 1   temazepam (RESTORIL) 15 MG capsule TAKE 1 CAPSULE(15 MG) BY MOUTH AT BEDTIME AS NEEDED FOR SLEEP 30 capsule 5   No facility-administered medications prior to visit.     PAST MEDICAL HISTORY: Past Medical History:  Diagnosis Date   Anemia    Depression    Diabetes mellitus without complication (HCC)    dx at age 56  type 1   GERD (gastroesophageal reflux disease)    Headache    Hypertension    Multiple sclerosis ([place])    Neuromuscular disorder ([place])    dx 2003 with MS   Neuropathy    PONV (postoperative nausea and vomiting)    sometimes..........Marland Kitchen   Vision abnormalities      PAST SURGICAL HISTORY: Past Surgical History:  Procedure Laterality Date   CHOLECYSTECTOMY     DENTAL SURGERY     LUMBAR LAMINECTOMY WITH COFLEX 1 LEVEL Bilateral 06/29/2015   Procedure: LUMBAR LAMINECTOMY WITH COFLEX 1 LEVEL;  Surgeon: Karie Chimera, MD;  Location: [facility];  Service: Neurosurgery;  Laterality: Bilateral;  LUMBAR LAMINECTOMY WITH COFLEX 1 LEVEL   right foot surgery     remove a piece of glass  (had c/o for about 1 yr)   TONSILLECTOMY     TUBAL LIGATION     WOUND EXPLORATION N/A 07/22/2015    Procedure: WOUND EXPLORATION;  Surgeon: Karie Chimera, MD;  Location: [facility];  Service: Neurosurgery;  Laterality: N/A;     FAMILY HISTORY: Family History  Problem Relation Age of Onset   Hypertension Mother    Diabetes Mother    Stroke Mother    Heart attack Mother    Peripheral vascular disease Mother      SOCIAL HISTORY: Social History   Socioeconomic History   Marital status: Married    Spouse name: Not on file   Number of children: Not on file   Years of education: Not on file   Highest education level: Not on file  Occupational History   Not on file  Tobacco Use   Smoking status: Every Day    Packs/day: 0.50    Years: 24.00    Pack years: 12.00    Types: Cigarettes   Smokeless tobacco: Never  Substance and Sexual Activity   Alcohol use: Not Currently    Alcohol/week: 0.0 standard drinks    Comment: occasional   Drug use: No   Sexual activity: Not on file  Other Topics Concern   Not on file  Social History Narrative   Not on file   Social Determinants of Health   Financial Resource Strain: Not on file  Food Insecurity: Not on file  Transportation Needs: Not on file  Physical Activity: Not on file  Stress: Not on file  Social Connections: Not on file  Intimate Partner Violence: Not on file     PHYSICAL EXAM  There were no vitals filed for this visit. There is no height or weight on file to calculate BMI.  Generalized: Well developed, in no acute distress  Cardiology: normal rate and rhythm, no murmur auscultated  Respiratory: clear to auscultation bilaterally    Neurological examination  Mentation: Alert oriented to time, place, history  taking. Follows all commands speech and language fluent Cranial nerve II-XII: Pupils were equal round reactive to light. Extraocular movements were full, visual field were full on confrontational test. Facial sensation and strength were normal. Uvula tongue midline. Head turning and shoulder shrug  were  normal and symmetric. Motor: The motor testing reveals 5 over 5 strength of all 4 extremities. Good symmetric motor tone is noted throughout.  Sensory: Sensory testing is intact to soft touch on all 4 extremities. No evidence of extinction is noted.  Coordination: Cerebellar testing reveals good finger-nose-finger and heel-to-shin bilaterally.  Gait and station: Gait is normal. Tandem gait is normal. Romberg is negative. No drift is seen.  Reflexes: Deep tendon reflexes are symmetric and normal bilaterally.    DIAGNOSTIC DATA (LABS, IMAGING, TESTING) - I reviewed patient records, labs, notes, testing and imaging myself where available.  Lab Results  Component Value Date   WBC 6.7 07/21/2015   HGB 13.0 07/21/2015   HCT 37.0 07/21/2015   MCV 85.6 07/21/2015   PLT 377 07/21/2015      Component Value Date/Time   NA 129 (L) 07/21/2015 1307   K 3.5 07/21/2015 1307   CL 89 (L) 07/21/2015 1307   CO2 27 07/21/2015 1307   GLUCOSE 457 (H) 07/21/2015 1307   BUN 9 07/21/2015 1307   CREATININE 0.69 07/21/2015 1307   CALCIUM 9.1 07/21/2015 1307   PROT 7.3 06/22/2015 1519   ALBUMIN 3.7 06/22/2015 1519   AST 13 (L) 06/22/2015 1519   ALT 15 06/22/2015 1519   ALKPHOS 76 06/22/2015 1519   BILITOT 0.4 06/22/2015 1519   GFRNONAA >60 07/21/2015 1307   GFRAA >60 07/21/2015 1307   No results found for: CHOL, HDL, LDLCALC, LDLDIRECT, TRIG, CHOLHDL Lab Results  Component Value Date   HGBA1C 12.3 (H) 07/21/2015   No results found for: VITAMINB12 No results found for: TSH  No flowsheet data found.   No flowsheet data found.   ASSESSMENT AND PLAN  45 y.o. year old female  has a past medical history of Anemia, Depression, Diabetes mellitus without complication ([place]), GERD (gastroesophageal reflux disease), Headache, Hypertension, Multiple sclerosis ([place]), Neuromuscular disorder ([place]), Neuropathy, PONV (postoperative nausea and vomiting), and Vision abnormalities. here with    Multiple  sclerosis ([place])  Chronic low back pain, unspecified back pain laterality, unspecified whether sciatica present  Other migraine without status migrainosus, not intractable  Dysesthesia  Diabetic polyneuropathy associated with type 2 diabetes mellitus (HCC)  Insomnia, unspecified type  Gait disturbance   No orders of the defined types were placed in this encounter.    No orders of the defined types were placed in this encounter.     Debbora Presto, MSN, FNP-C 12/15/2021, 4:00 PM  [facility] [address] [place], [place] [address] [phone]

## 2021-12-27 ENCOUNTER — Telehealth: Payer: Self-pay | Admitting: Family Medicine

## 2021-12-27 ENCOUNTER — Ambulatory Visit: Payer: Medicare Other | Admitting: Family Medicine

## 2021-12-27 ENCOUNTER — Other Ambulatory Visit: Payer: Self-pay

## 2021-12-27 NOTE — Telephone Encounter (Signed)
FYI- Pt called states she is running late said she would be here in 5 mins. I informed pt that I can't guarantee she would still be seen.

## 2021-12-29 ENCOUNTER — Ambulatory Visit (INDEPENDENT_AMBULATORY_CARE_PROVIDER_SITE_OTHER): Payer: Commercial Managed Care - HMO | Admitting: Neurology

## 2021-12-29 ENCOUNTER — Encounter: Payer: Self-pay | Admitting: Neurology

## 2021-12-29 ENCOUNTER — Other Ambulatory Visit: Payer: Self-pay

## 2021-12-29 VITALS — BP 138/84 | HR 91 | Ht 72.0 in | Wt 251.0 lb

## 2021-12-29 DIAGNOSIS — R208 Other disturbances of skin sensation: Secondary | ICD-10-CM | POA: Diagnosis not present

## 2021-12-29 DIAGNOSIS — M62838 Other muscle spasm: Secondary | ICD-10-CM

## 2021-12-29 DIAGNOSIS — G4700 Insomnia, unspecified: Secondary | ICD-10-CM

## 2021-12-29 DIAGNOSIS — G35 Multiple sclerosis: Secondary | ICD-10-CM | POA: Diagnosis not present

## 2021-12-29 DIAGNOSIS — R269 Unspecified abnormalities of gait and mobility: Secondary | ICD-10-CM | POA: Diagnosis not present

## 2021-12-29 DIAGNOSIS — E1142 Type 2 diabetes mellitus with diabetic polyneuropathy: Secondary | ICD-10-CM

## 2021-12-29 MED ORDER — QUETIAPINE FUMARATE 25 MG PO TABS: 25.0000 mg | ORAL_TABLET | Freq: Every day | ORAL | 11 refills | Status: DC

## 2021-12-29 NOTE — Progress Notes (Signed)
[facility]  PATIENT: Angela Solomon DOB: 24-Jan-1976  REFERRING CLINICIAN: Dr. Rita Ohara  HISTORY FROM: patient REASON FOR VISIT: MS   HISTORICAL  CHIEF COMPLAINT:  Chief Complaint  Patient presents with   Follow-up    Rm 1, alone. Here for 6 month MS f/u, off DMT for her MS. Pt reports being stable. Pt reports seeing floaters today when coughing.     HISTORY OF PRESENT ILLNESS:   Angela Solomon is a 46 y.o. woman with relapsing remitting multiple sclerosis and back pain.   Update 12/29/2021 She is not on a DMT now.    She was on Avonex but has not had lately due to other medical issues.  No recent exacerbation.   She is noting more headaches and continued back pain.    She is noting more pain and tightness in her back (including lower) when she raises her arms.  This is a tight sensation.   She also notes a lot more headaches and poor sleep       She takes cyclobenzaprine 3 times a day.  She is also on gabapentin and amitriptyline.      She tries to walk for exercise ut pain is worse when she does so.       She takes temazepam at bedtime.    She feels she gers no sleep.     Trazodone was recently started and heps her fall asleep but not stay asleep.   She notes a lot of thoughs go through her mind at night.   Her husband likes to keep the TV on at night.       She has IDDM.  She has sores in the right foot, heel and ankles.  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   She sees wound care regularly for a right heel sore.     She has been on antobiotics  She is reporting more lower back pain worse when she has a more active day.   Pian is on the left side more than right.    She takes hydrocodone 10 mg bid.   She takes 800 mg gabapentin po tid.   She was on Flexeril and feels it helps her better than the baclofen.     She will sometimes take a hydrocodone when she gets a headache as well.   She is also on Cymbalta.    She has problems with her gait but is  likely more from her pain (foot and back) more than MS. She notes reduced short term memory.  She reports urinary urgency but no incontinence.      She has lost 11 pounds this year.      MS History:   She presented in 2003 with gait ataxia, slurred speech and right sided paresthesias. Imaging studies were performed and she was diagnosed with multiple sclerosis. Initially, she was placed on Rebif but had skin reactions and switched to Copaxone. On Copaxone, she had an immediate hypersensitivity response and it was discontinued. She has been on Avonex most of the time but has been off for months at a time when she has had insurance difficulties.      Review of MRI's: MRI Brain (report) 12/13/2020 showed Motion degraded study.   Compared to 2015  Scattered foci of T2 hyperintensity within the white matter of  the cerebral hemispheres, nonspecific but consistent with the clinical diagnosis of multiple sclerosis. Bilateral frontoparietal and callosal lesions appear enlarged when compared to prior  MRI, particularly the periatrial lesions. No enhancing lesion identified.   MRI Brain 04/24/2016 showed T2/FLAIR hyperintense foci, predominantly in the periventricular white matter in a pattern and configuration consistent with chronic demyelinating plaque associated with multiple sclerosis. None of the foci enhances after gadolinium administration.  There are no acute findings.   There is no change when compared to the MRI dated 09/28/2013.  MRI cervical spine 04/24/2016 showed Multiple foci within the cervical spinal cord consistent with chronic demyelinating plaque associated with multiple sclerosis. None of the foci enhances after contrast administration. There is no change when compared to the MRI dated 05/20/2014.  Mild degenerative changes at C4-C5, C5-C6 and C7-T1 that did not lead to nerve root impingement.  MRI of the cervical spine dated 05/20/2014. It showed enhancing spinal cord lesions consistent with MS.  C2-C3 there was a left lateral focus. Also at C2-C3 there was a posterior focus. At C3-C4 there was a right posterior lateral focus. At C4 there was a dorsal focus and at C5 there was another dorsal focus. 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 There are multilevel degenerative changes most pronounced at C7-T1 where there could be dynamic impingement of the right C8 nerve root.   MRI of the brain dated 09/28/2013 showing diffuse white matter foci in the periventricular and deep white matter consistent with [place].  Although there were no enhancing foci, when compared to a previous MRI dated 10/11/2010, there was some progression.   MRI of the lumbar spine dated 04/27/2015 showed a large disc extrusion with sequestered fragment causing bilateral L5 nerve root compression.    MRI of the brain with and without contrast dated 10/16/2008 showed enhancing lesions.  She was reportedly off medication at that time.   REVIEW OF SYSTEMS:  Constitutional: No fevers, chills, sweats, or change in appetite.   Fatigue, poor sleep Eyes: No visual changes, double vision, eye pain Ear, nose and throat: No hearing loss, ear pain, nasal congestion, sore throat Cardiovascular: No chest pain, palpitations Respiratory:  No shortness of breath at rest or with exertion.   No wheezes GastrointestinaI: No nausea, vomiting, diarrhea, abdominal pain, fecal incontinence Genitourinary:  No dysuria, urinary retention or frequency.  No nocturia. Musculoskeletal:  as above. Integumentary: No rash, pruritus, skin lesions Neurological: as above Psychiatric: No depression at this time.  No anxiety Endocrine: No palpitations, diaphoresis, change in appetite, change in weigh or increased thirst Hematologic/Lymphatic:  No anemia, purpura, petechiae. Allergic/Immunologic: No itchy/runny eyes, nasal congestion, recent allergic reactions, rashes  ALLERGIES: Allergies  Allergen Reactions   Atorvastatin Other (See Comments)     Myalgia Myalgia   Glatiramer Anaphylaxis    Generic Copaxone   Lisinopril Anaphylaxis   Niacin Diarrhea   Valproic Acid Anaphylaxis   Buprenorphine Hcl Hives   Morphine And Related Hives   Aspirin Nausea And Vomiting    Fevers and GI upset   Hydrocodone-Acetaminophen Nausea And Vomiting    Pt states can take Hydrocodone but has a reaction to a additive in vicodin   Zithromax [Azithromycin] Rash    HOME MEDICATIONS: Outpatient Medications Prior to Visit  Medication Sig Dispense Refill   acetaminophen (TYLENOL) 500 MG tablet Take 1,000 mg by mouth every 6 (six) hours as needed for mild pain or moderate pain.     albuterol (PROVENTIL HFA;VENTOLIN HFA) 108 (90 BASE) MCG/ACT inhaler Inhale 1 puff into the lungs as needed for wheezing or shortness of breath. Reported on 04/06/2016     amitriptyline (ELAVIL)  75 MG tablet TAKE 1 TABLET(75 MG) BY MOUTH AT BEDTIME 30 tablet 11   B-D INS SYR ULTRAFINE 1CC/31G 31G X 5/16" 1 ML MISC See admin instructions.  0   carvedilol (COREG) 25 MG tablet Take 25 mg by mouth 2 (two) times daily.      cyclobenzaprine (FLEXERIL) 10 MG tablet TAKE 1 TABLET(10 MG) BY MOUTH THREE TIMES DAILY AS NEEDED FOR MUSCLE SPASMS 90 tablet 11   DULoxetine (CYMBALTA) 60 MG capsule Take 60 mg by mouth daily.     empagliflozin (JARDIANCE) 10 MG TABS tablet Take 25 mg by mouth daily.      ferrous sulfate 325 (65 FE) MG tablet Take 325 mg by mouth daily.      fluconazole (DIFLUCAN) 150 MG tablet as needed.   0   fluticasone (FLONASE) 50 MCG/ACT nasal spray Place 2 sprays into both nostrils as needed for allergies.      gabapentin (NEURONTIN) 800 MG tablet TAKE 1 TABLET BY MOUTH IN THE MORNING, AT NOON, IN THE EVENING AND AT BEDTIME 90 tablet 2   HYDROcodone-acetaminophen (NORCO) 10-325 MG tablet Take 1 tablet by mouth twice daily as needed. Must keep follow up appt (12/27/21) for continued refills. 60 tablet 0   insulin glargine (LANTUS) 100 UNIT/ML injection Inject 50 Units into the  skin 2 (two) times daily.      interferon beta-1a (AVONEX) 30 MCG injection Inject 30 mcg into the muscle every 7 (seven) days. 12 each 3   losartan-hydrochlorothiazide (HYZAAR) 100-25 MG per tablet Take 1 tablet by mouth daily.      pantoprazole (PROTONIX) 40 MG tablet Take 40 mg by mouth at bedtime.      promethazine (PHENERGAN) 25 MG tablet Take 1 tablet (25 mg total) by mouth every 6 (six) hours as needed for nausea or vomiting. 30 tablet 1   temazepam (RESTORIL) 15 MG capsule TAKE 1 CAPSULE(15 MG) BY MOUTH AT BEDTIME AS NEEDED FOR SLEEP 30 capsule 5   amLODipine (NORVASC) 5 MG tablet Take 5 mg by mouth.     amoxicillin-clavulanate (AUGMENTIN) 875-125 MG tablet Take 1 tablet by mouth 2 (two) times daily.     minocycline (DYNACIN) 100 MG tablet Take 100 mg by mouth 2 (two) times daily.     No facility-administered medications prior to visit.    PAST MEDICAL HISTORY: Past Medical History:  Diagnosis Date   Anemia    Depression    Diabetes mellitus without complication (HCC)    dx at age 43  type 1   GERD (gastroesophageal reflux disease)    Headache    Hypertension    Multiple sclerosis ([place])    Neuromuscular disorder ([place])    dx 2003 with MS   Neuropathy    PONV (postoperative nausea and vomiting)    sometimes..........Marland Kitchen   Vision abnormalities     PAST SURGICAL HISTORY: Past Surgical History:  Procedure Laterality Date   CHOLECYSTECTOMY     DENTAL SURGERY     LUMBAR LAMINECTOMY WITH COFLEX 1 LEVEL Bilateral 06/29/2015   Procedure: LUMBAR LAMINECTOMY WITH COFLEX 1 LEVEL;  Surgeon: Karie Chimera, MD;  Location: [place] [facility];  Service: Neurosurgery;  Laterality: Bilateral;  LUMBAR LAMINECTOMY WITH COFLEX 1 LEVEL   right foot surgery     remove a piece of glass  (had c/o for about 1 yr)   TONSILLECTOMY     TUBAL LIGATION     WOUND EXPLORATION N/A 07/22/2015   Procedure: WOUND EXPLORATION;  Surgeon: Karie Chimera,  MD;  Location: [place];  Service: Neurosurgery;   Laterality: N/A;    FAMILY HISTORY: Family History  Problem Relation Age of Onset   Hypertension Mother    Diabetes Mother    Stroke Mother    Heart attack Mother    Peripheral vascular disease Mother     SOCIAL HISTORY:  Social History   Socioeconomic History   Marital status: Married    Spouse name: Not on file   Number of children: Not on file   Years of education: Not on file   Highest education level: Not on file  Occupational History   Not on file  Tobacco Use   Smoking status: Every Day    Packs/day: 0.50    Years: 24.00    Pack years: 12.00    Types: Cigarettes   Smokeless tobacco: Never  Substance and Sexual Activity   Alcohol use: Not Currently    Alcohol/week: 0.0 standard drinks    Comment: occasional   Drug use: No   Sexual activity: Not on file  Other Topics Concern   Not on file  Social History Narrative   Not on file   Social Determinants of Health   Financial Resource Strain: Not on file  Food Insecurity: Not on file  Transportation Needs: Not on file  Physical Activity: Not on file  Stress: Not on file  Social Connections: Not on file  Intimate Partner Violence: Not on file     PHYSICAL EXAM  Vitals:   12/29/21 0902  BP: 138/84  Pulse: 91  Weight: 251 lb (113.9 kg)  Height: 6' (1.829 m)    Body mass index is 34.04 kg/m.   General: The patient is well-developed and well-nourished and in no acute distress   Lower Back: She has some tenderness in the lower lumbar paraspinal muscles.  Neurologic Exam  Mental status: She is alert and oriented. The affect is normal.. She has no difficulty with speech. Focus is mildly reduced short-term memory seems appropriate.  Cranial nerves: Extraocular movements are full.  Facial strength and sensation is normal.  Trapezius strength is normal.  . Palatal elevation of protrusion is midline. Hearing is normal and symmetric.    Motor:  Muscle bulk and tone are normal. Strength was normal in  the right arm and she had slight weakness 4+/5 in the ulnar innervated muscles of the left. Strength is 4+/5 in the toes. The left great toe has been surgically removed   Sensory: She reports reduced sensation to vibration in the right arm and leg relative to the left.  Coordination: Cerebellar testing reveals good finger-nose-finger but poor heel-to-shin bilaterally.  Gait and station: Station is wide.  Gait is difficult to assess due to her right boot and recent surgery.    Reflexes: Deep tendon reflexes are symmetric in her arms and legs (trace ankle)..   _________________________________  Multiple sclerosis ([place])  Insomnia, unspecified type  Dysesthesia  Gait disturbance  Muscle spasm  Diabetic polyneuropathy associated with type 2 diabetes mellitus ([place])  1.   For now she will remain off of a disease modifying therapy.  She will need to reconsider if relapses. 2.   Start Deroquel and stop temazepam for insomnia.  Continue hydrocodone for pain.  If pain worsens, she can see a [facility].   3.   A few medications overlap- amitriptyline, Cymbalta, trazodone and cyclobenzaprine.    We stopped trazodone.  4.   Stay active and exercise as tolerated. 5.  Continue to use the shower chair  6.   rtc 6 months, sooner if problems  Rashan Rounsaville A. Felecia Shelling, MD, PhD 01/02/4036, 5:43 AM Certified in Neurology, Clinical Neurophysiology, Sleep Medicine, Pain Medicine and Neuroimaging  [facility] [address], [place], [place] [address] [phone]

## 2022-01-03 ENCOUNTER — Other Ambulatory Visit: Payer: Self-pay | Admitting: Neurology

## 2022-01-03 MED ORDER — HYDROCODONE-ACETAMINOPHEN 10-325 MG PO TABS: ORAL_TABLET | ORAL | 0 refills | Status: DC

## 2022-01-03 NOTE — Telephone Encounter (Signed)
Pt requesting refill for HYDROcodone-acetaminophen (NORCO) 10-325 MG tablet. [facility]acy [facility] [phone] - [place], [place] - [address]

## 2022-01-16 ENCOUNTER — Encounter: Payer: Self-pay | Admitting: Neurology

## 2022-01-16 ENCOUNTER — Other Ambulatory Visit: Payer: Self-pay | Admitting: *Deleted

## 2022-01-16 MED ORDER — QUETIAPINE FUMARATE 50 MG PO TABS: 50.0000 mg | ORAL_TABLET | Freq: Every day | ORAL | 11 refills | Status: DC

## 2022-02-06 ENCOUNTER — Other Ambulatory Visit: Payer: Self-pay | Admitting: Neurology

## 2022-02-06 MED ORDER — HYDROCODONE-ACETAMINOPHEN 10-325 MG PO TABS: ORAL_TABLET | ORAL | 0 refills | Status: DC

## 2022-02-06 NOTE — Telephone Encounter (Signed)
Pt request refill for HYDROcodone-acetaminophen (NORCO) 10-325 MG tablet at [facility] #[id number]

## 2022-02-18 ENCOUNTER — Encounter: Payer: Self-pay | Admitting: Neurology

## 2022-03-06 ENCOUNTER — Other Ambulatory Visit: Payer: Self-pay | Admitting: Neurology

## 2022-03-06 MED ORDER — HYDROCODONE-ACETAMINOPHEN 10-325 MG PO TABS: ORAL_TABLET | ORAL | 0 refills | Status: DC

## 2022-03-06 NOTE — Telephone Encounter (Signed)
Pt is requesting a refill for HYDROcodone-acetaminophen (NORCO) 10-325 MG tablet.  Pharmacy:  [id number]   

## 2022-04-05 ENCOUNTER — Other Ambulatory Visit: Payer: Self-pay | Admitting: Neurology

## 2022-04-05 MED ORDER — HYDROCODONE-ACETAMINOPHEN 10-325 MG PO TABS: ORAL_TABLET | ORAL | 0 refills | Status: DC

## 2022-04-05 NOTE — Telephone Encounter (Signed)
Pt called needing a refill request for her HYDROcodone-acetaminophen (NORCO) 10-325 MG tablet sent to the [facility] on [address] ?

## 2022-04-10 ENCOUNTER — Other Ambulatory Visit: Payer: Self-pay | Admitting: Neurology

## 2022-04-11 ENCOUNTER — Encounter: Payer: Self-pay | Admitting: Neurology

## 2022-05-04 ENCOUNTER — Other Ambulatory Visit: Payer: Self-pay | Admitting: Neurology

## 2022-05-04 MED ORDER — HYDROCODONE-ACETAMINOPHEN 10-325 MG PO TABS: ORAL_TABLET | ORAL | 0 refills | Status: DC

## 2022-05-04 NOTE — Telephone Encounter (Signed)
Pt is requesting a refill for HYDROcodone-acetaminophen (NORCO) 10-325 MG tablet.  Pharmacy:  [id number]   

## 2022-05-04 NOTE — Telephone Encounter (Signed)
Last OV was on 12/29/21.  ?Next OV is scheduled for 06/29/22.  ?Last RX was written on 04/05/22 for 60 tabs.  ? ?[place] Drug Database has been reviewed. Please e-scribe as work in MD.  ?

## 2022-05-09 ENCOUNTER — Telehealth: Payer: Self-pay | Admitting: Family Medicine

## 2022-05-09 NOTE — Telephone Encounter (Signed)
VM box full, sent mychart msg asking pt to cb and r/s 7/6 appt- Amy out. ?

## 2022-05-10 ENCOUNTER — Telehealth: Payer: Self-pay | Admitting: Neurology

## 2022-05-10 ENCOUNTER — Other Ambulatory Visit: Payer: Self-pay | Admitting: Neurology

## 2022-05-10 MED ORDER — HYDROCODONE-ACETAMINOPHEN 10-325 MG PO TABS: ORAL_TABLET | ORAL | 0 refills | Status: DC

## 2022-05-10 NOTE — Addendum Note (Signed)
Addended by: Wyvonnia Lora on: 05/10/2022 05:11 PM ? ? Modules accepted: Orders ? ?

## 2022-05-10 NOTE — Telephone Encounter (Signed)
Pt states she is to take 2 a day of HYDROcodone-acetaminophen (NORCO) 10-325 MG tablet, but she was only given 30.  Pt asking if Dr Felecia Shelling will change the Rx  or if he wants her to take this differently, please call. ?

## 2022-05-10 NOTE — Telephone Encounter (Signed)
Called pt, went to VM and VM full.  Will have MD resend. ?Pt refilled 05/04/22 #30.  ?

## 2022-05-23 ENCOUNTER — Encounter: Payer: Self-pay | Admitting: Neurology

## 2022-05-23 ENCOUNTER — Telehealth: Payer: Self-pay | Admitting: Neurology

## 2022-05-23 NOTE — Telephone Encounter (Signed)
Pt is needing a refill request for her HYDROcodone-acetaminophen (NORCO) 10-325 MG tablet sent in to the [facility] on [address]

## 2022-05-23 NOTE — Telephone Encounter (Signed)
Last seen 12/29/21 and next f/u scheduled for 07/12/22.  Checked drug registry. Last refilled 05/04/22 #30 (typically #60). She is due.  However, looks like Dr. Felecia Shelling already sent in prescription 05/10/22 #60 to fill on or after 05/19/22. Should be on file at pharmacy for pt to fill.

## 2022-05-26 ENCOUNTER — Other Ambulatory Visit: Payer: Self-pay | Admitting: Neurology

## 2022-06-29 ENCOUNTER — Ambulatory Visit: Payer: Commercial Managed Care - HMO | Admitting: Family Medicine

## 2022-07-06 ENCOUNTER — Other Ambulatory Visit: Payer: Self-pay | Admitting: Neurology

## 2022-07-06 MED ORDER — HYDROCODONE-ACETAMINOPHEN 10-325 MG PO TABS: ORAL_TABLET | ORAL | 0 refills | Status: DC

## 2022-07-12 ENCOUNTER — Ambulatory Visit (INDEPENDENT_AMBULATORY_CARE_PROVIDER_SITE_OTHER): Payer: Medicare Other | Admitting: Family Medicine

## 2022-07-12 ENCOUNTER — Encounter: Payer: Self-pay | Admitting: Family Medicine

## 2022-07-12 VITALS — BP 128/82 | HR 106 | Ht 72.0 in | Wt 257.0 lb

## 2022-07-12 DIAGNOSIS — G4700 Insomnia, unspecified: Secondary | ICD-10-CM | POA: Diagnosis not present

## 2022-07-12 DIAGNOSIS — G35 Multiple sclerosis: Secondary | ICD-10-CM | POA: Diagnosis not present

## 2022-07-12 DIAGNOSIS — R269 Unspecified abnormalities of gait and mobility: Secondary | ICD-10-CM

## 2022-07-12 DIAGNOSIS — G8929 Other chronic pain: Secondary | ICD-10-CM

## 2022-07-12 DIAGNOSIS — M545 Low back pain, unspecified: Secondary | ICD-10-CM | POA: Diagnosis not present

## 2022-07-12 DIAGNOSIS — G43809 Other migraine, not intractable, without status migrainosus: Secondary | ICD-10-CM | POA: Diagnosis not present

## 2022-07-12 NOTE — Progress Notes (Signed)
Chief Complaint  Patient presents with   Follow-up    Pt alone, rm 2. MS follow up Pt here today for th pass 4 months she woke up feeling funny on her left side. She went to get up and fell. Her balance improved over few days. Between knees and lower back feels like rubber band. Cant stand straight long. She has noticed short term memory concerns.     HISTORY OF PRESENT ILLNESS:  07/12/22 ALL:  Angela Solomon is a 46 y.o. female here today for follow up for RRMS. She remains off DMT. Previously on Avonex.   She feels that she is not doing well. She reports about 4 months ago, she woke up feeling tingling and numb on the left side that lasted about 4-5 days. Abnormal sensation resolved and has not returned. She feels that her balance is "way off". She reports multiple falls (6-8) since March. No significant injuries. 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 She does have a cane but does not use it all the time. She tells me it is in her car. She can't stand for any period of time.   Pain is "over the roof." She hurts all the time. She reports that from her lower back to her knees, she feels like she is a rubber band. She has pain of right foot from previous infection. Hydrocodone 10/'325mg'$  BID, gabapentin '800mg'$  TID, cyclobenzaprine '10mg'$  TID do not seem to help much at all.   Sleep, Dr Felecia Shelling switched temazepam and trazodone to quetiapine '25mg'$  at last visit 12/2021. She called a few weeks later having more difficulty with insomnia. She reported her granddaughter was killed in a house fire and having more trouble with headaches. Quetiapine was increased to '50mg'$  QHS. She doesn't go to bed until 6-7am and sleeps until 1-2pm. She reports sleep cycle has always been off due to working third shift. Her husband keeps the TV on all night.   She continues amitriptyline '75mg'$  daily for headaches. She has 3-4 migraine days a week. She is taking [employer]  multiple times a week.   She is having a hard time with short term memory loss. She reports that her family feels that she should be super woman. She endorses more stress.  HISTORY (copied from Dr Garth Bigness previous note)  Angela Solomon is a 46 y.o. woman with relapsing remitting multiple sclerosis and back pain.    Update 12/29/2021 She is not on a DMT now.    She was on Avonex but has not had lately due to other medical issues.  No recent exacerbation.   She is noting more headaches and continued back pain.     She is noting more pain and tightness in her back (including lower) when she raises her arms.  This is a tight sensation.   She also notes a lot more headaches and poor sleep        She takes cyclobenzaprine 3 times a day.  She is also on gabapentin and amitriptyline.       She tries to walk for exercise ut pain is worse when she does so.        She takes temazepam at bedtime.    She feels she gers no sleep.     Trazodone was recently started and heps her fall asleep but not stay asleep.   She notes a lot of thoughs go through her mind at  night.   Her husband likes to keep the TV on at night.      She has IDDM.  She has sores in the right foot, heel and ankles.  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   She sees wound care regularly for a right heel sore.     She has been on antobiotics   She is reporting more lower back pain worse when she has a more active day.   Pian is on the left side more than right.    She takes hydrocodone 10 mg bid.   She takes 800 mg gabapentin po tid.   She was on Flexeril and feels it helps her better than the baclofen.     She will sometimes take a hydrocodone when she gets a headache as well.   She is also on Cymbalta.     She has problems with her gait but is likely more from her pain (foot and back) more than MS. She notes reduced short term memory.  She reports urinary urgency but no incontinence.      She has lost 11 pounds this year.       MS  History:   She presented in 2003 with gait ataxia, slurred speech and right sided paresthesias. Imaging studies were performed and she was diagnosed with multiple sclerosis. Initially, she was placed on Rebif but had skin reactions and switched to Copaxone. On Copaxone, she had an immediate hypersensitivity response and it was discontinued. She has been on Avonex most of the time but has been off for months at a time when she has had insurance difficulties.       Review of MRI's: MRI Brain (report) 12/13/2020 showed Motion degraded study.   Compared to 2015  Scattered foci of T2 hyperintensity within the white matter of  the cerebral hemispheres, nonspecific but consistent with the clinical diagnosis of multiple sclerosis. Bilateral frontoparietal and callosal lesions appear enlarged when compared to prior MRI, particularly the periatrial lesions. No enhancing lesion identified.    MRI Brain 04/24/2016 showed T2/FLAIR hyperintense foci, predominantly in the periventricular white matter in a pattern and configuration consistent with chronic demyelinating plaque associated with multiple sclerosis. None of the foci enhances after gadolinium administration.  There are no acute findings.   There is no change when compared to the MRI dated 09/28/2013.   MRI cervical spine 04/24/2016 showed Multiple foci within the cervical spinal cord consistent with chronic demyelinating plaque associated with multiple sclerosis. None of the foci enhances after contrast administration. There is no change when compared to the MRI dated 05/20/2014.  Mild degenerative changes at C4-C5, C5-C6 and C7-T1 that did not lead to nerve root impingement.   MRI of the cervical spine dated 05/20/2014. It showed enhancing spinal cord lesions consistent with MS. C2-C3 there was a left lateral focus. Also at C2-C3 there was a posterior focus. At C3-C4 there was a right posterior lateral focus. At C4 there was a dorsal focus and at C5 there was  another dorsal focus. 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 There are multilevel degenerative changes most pronounced at C7-T1 where there could be dynamic impingement of the right C8 nerve root.    MRI of the brain dated 09/28/2013 showing diffuse white matter foci in the periventricular and deep white matter consistent with [place].  Although there were no enhancing foci, when compared to a previous MRI dated 10/11/2010, there was some progression.    MRI  of the lumbar spine dated 04/27/2015 showed a large disc extrusion with sequestered fragment causing bilateral L5 nerve root compression.     MRI of the brain with and without contrast dated 10/16/2008 showed enhancing lesions.  She was reportedly off medication at that time.   REVIEW OF SYSTEMS: Out of a complete 14 system review of symptoms, the patient complains only of the following symptoms, see HPI and all other reviewed systems are negative.   ALLERGIES: Allergies  Allergen Reactions   Atorvastatin Other (See Comments)    Myalgia Myalgia   Glatiramer Anaphylaxis    Generic Copaxone   Lisinopril Anaphylaxis   Niacin Diarrhea   Valproic Acid Anaphylaxis   Buprenorphine Hcl Hives   Morphine And Related Hives   Aspirin Nausea And Vomiting    Fevers and GI upset   Hydrocodone-Acetaminophen Nausea And Vomiting    Pt states can take Hydrocodone but has a reaction to a additive in vicodin   Zithromax [Azithromycin] Rash     HOME MEDICATIONS: Outpatient Medications Prior to Visit  Medication Sig Dispense Refill   albuterol (PROVENTIL HFA;VENTOLIN HFA) 108 (90 BASE) MCG/ACT inhaler Inhale 1 puff into the lungs as needed for wheezing or shortness of breath. Reported on 04/06/2016     amitriptyline (ELAVIL) 75 MG tablet TAKE 1 TABLET(75 MG) BY MOUTH AT BEDTIME 30 tablet 5   amLODipine (NORVASC) 5 MG tablet Take 5 mg by mouth.     B-D INS SYR ULTRAFINE 1CC/31G 31G X 5/16" 1 ML MISC See admin instructions.  0   carvedilol  (COREG) 25 MG tablet Take 25 mg by mouth 2 (two) times daily.      cyclobenzaprine (FLEXERIL) 10 MG tablet TAKE 1 TABLET(10 MG) BY MOUTH THREE TIMES DAILY AS NEEDED FOR MUSCLE SPASMS 90 tablet 5   DULoxetine (CYMBALTA) 60 MG capsule Take 60 mg by mouth daily.     empagliflozin (JARDIANCE) 10 MG TABS tablet Take 25 mg by mouth daily.      ferrous sulfate 325 (65 FE) MG tablet Take 325 mg by mouth daily.      fluconazole (DIFLUCAN) 150 MG tablet as needed.   0   fluticasone (FLONASE) 50 MCG/ACT nasal spray Place 2 sprays into both nostrils as needed for allergies.      gabapentin (NEURONTIN) 800 MG tablet Take 1 tablet (800 mg total) by mouth 3 (three) times daily. 90 tablet 5   HYDROcodone-acetaminophen (NORCO) 10-325 MG tablet Take 1 tablet by mouth twice daily as needed. 60 tablet 0   insulin glargine (LANTUS) 100 UNIT/ML injection Inject 50 Units into the skin 2 (two) times daily.      interferon beta-1a (AVONEX) 30 MCG injection Inject 30 mcg into the muscle every 7 (seven) days. 12 each 3   losartan-hydrochlorothiazide (HYZAAR) 100-25 MG per tablet Take 1 tablet by mouth daily.      pantoprazole (PROTONIX) 40 MG tablet Take 40 mg by mouth at bedtime.      promethazine (PHENERGAN) 25 MG tablet Take 1 tablet (25 mg total) by mouth every 6 (six) hours as needed for nausea or vomiting. 30 tablet 1   QUEtiapine (SEROQUEL) 50 MG tablet Take 1 tablet (50 mg total) by mouth at bedtime. 30 tablet 11   rosuvastatin (CRESTOR) 20 MG tablet      No facility-administered medications prior to visit.     PAST MEDICAL HISTORY: Past Medical History:  Diagnosis Date   Anemia    Depression    Diabetes mellitus  without complication (HCC)    dx at age 70  type 1   GERD (gastroesophageal reflux disease)    Headache    Hypertension    Multiple sclerosis ([place])    Neuromuscular disorder ([place])    dx 2003 with MS   Neuropathy    PONV (postoperative nausea and vomiting)    sometimes..........Marland Kitchen   Vision  abnormalities      PAST SURGICAL HISTORY: Past Surgical History:  Procedure Laterality Date   CHOLECYSTECTOMY     DENTAL SURGERY     LUMBAR LAMINECTOMY WITH COFLEX 1 LEVEL Bilateral 06/29/2015   Procedure: LUMBAR LAMINECTOMY WITH COFLEX 1 LEVEL;  Surgeon: Karie Chimera, MD;  Location: [facility];  Service: Neurosurgery;  Laterality: Bilateral;  LUMBAR LAMINECTOMY WITH COFLEX 1 LEVEL   right foot surgery     remove a piece of glass  (had c/o for about 1 yr)   TONSILLECTOMY     TUBAL LIGATION     WOUND EXPLORATION N/A 07/22/2015   Procedure: WOUND EXPLORATION;  Surgeon: Karie Chimera, MD;  Location: [facility];  Service: Neurosurgery;  Laterality: N/A;     FAMILY HISTORY: Family History  Problem Relation Age of Onset   Hypertension Mother    Diabetes Mother    Stroke Mother    Heart attack Mother    Peripheral vascular disease Mother      SOCIAL HISTORY: Social History   Socioeconomic History   Marital status: Married    Spouse name: Not on file   Number of children: Not on file   Years of education: Not on file   Highest education level: Not on file  Occupational History   Not on file  Tobacco Use   Smoking status: Every Day    Packs/day: 0.50    Years: 24.00    Total pack years: 12.00    Types: Cigarettes   Smokeless tobacco: Never  Substance and Sexual Activity   Alcohol use: Not Currently    Alcohol/week: 0.0 standard drinks of alcohol    Comment: occasional   Drug use: No   Sexual activity: Not on file  Other Topics Concern   Not on file  Social History Narrative   Not on file   Social Determinants of Health   Financial Resource Strain: Not on file  Food Insecurity: Not on file  Transportation Needs: Not on file  Physical Activity: Not on file  Stress: Not on file  Social Connections: Not on file  Intimate Partner Violence: Not on file     PHYSICAL EXAM  Vitals:   07/12/22 1314  BP: 128/82  Pulse: (!) 106  Weight: 257 lb (116.6 kg)   Height: 6' (1.829 m)   Body mass index is 34.86 kg/m.  Generalized: Well developed, in no acute distress  Cardiology: normal rate and rhythm, no murmur auscultated  Respiratory: clear to auscultation bilaterally    Neurological examination  Mentation: Alert oriented to time, place, history taking. Follows all commands speech and language fluent Cranial nerve II-XII: Pupils were equal round reactive to light. Extraocular movements were full, visual field were full on confrontational test. Facial sensation and strength were normal. Head turning and shoulder shrug  were normal and symmetric. Motor: The motor testing reveals 5 over 5 strength of all 4 extremities. Good symmetric motor tone is noted throughout.  Sensory: Sensory testing is intact to soft touch on all 4 extremities. No evidence of extinction is noted.  Coordination: Cerebellar testing reveals good finger-nose-finger and  heel-to-shin bilaterally.  Gait and station: Gait is normal.  Reflexes: Deep tendon reflexes are symmetric and normal bilaterally.    DIAGNOSTIC DATA (LABS, IMAGING, TESTING) - I reviewed patient records, labs, notes, testing and imaging myself where available.  Lab Results  Component Value Date   WBC 6.7 07/21/2015   HGB 13.0 07/21/2015   HCT 37.0 07/21/2015   MCV 85.6 07/21/2015   PLT 377 07/21/2015      Component Value Date/Time   NA 129 (L) 07/21/2015 1307   K 3.5 07/21/2015 1307   CL 89 (L) 07/21/2015 1307   CO2 27 07/21/2015 1307   GLUCOSE 457 (H) 07/21/2015 1307   BUN 9 07/21/2015 1307   CREATININE 0.69 07/21/2015 1307   CALCIUM 9.1 07/21/2015 1307   PROT 7.3 06/22/2015 1519   ALBUMIN 3.7 06/22/2015 1519   AST 13 (L) 06/22/2015 1519   ALT 15 06/22/2015 1519   ALKPHOS 76 06/22/2015 1519   BILITOT 0.4 06/22/2015 1519   GFRNONAA >60 07/21/2015 1307   GFRAA >60 07/21/2015 1307   No results found for: "CHOL", "HDL", "LDLCALC", "LDLDIRECT", "TRIG", "CHOLHDL" Lab Results  Component  Value Date   HGBA1C 12.3 (H) 07/21/2015   No results found for: "VITAMINB12" No results found for: "TSH"      No data to display               No data to display           ASSESSMENT AND PLAN  46 y.o. year old female  has a past medical history of Anemia, Depression, Diabetes mellitus without complication ([place]), GERD (gastroesophageal reflux disease), Headache, Hypertension, Multiple sclerosis ([place]), Neuromuscular disorder ([place]), Neuropathy, PONV (postoperative nausea and vomiting), and Vision abnormalities. here with    Multiple sclerosis ([place]) - Plan: MR BRAIN W WO CONTRAST, For home use only DME Other see comment  Insomnia, unspecified type - Plan: For home use only DME Other see comment  Other migraine without status migrainosus, not intractable - Plan: For home use only DME Other see comment  Chronic low back pain, unspecified back pain laterality, unspecified whether sciatica present - Plan: For home use only DME Other see comment  Rocky Moynahan reports that she has not felt well, recently. She has had more trouble with balance since amputation of right great toe about a year ago. She has worsening chronic pain, headaches and short term memory loss. I am concerned that depression and grief could be contributing. I will order an MRI due to reports of paresthesias 4 months ago. We will consider DMT if needed pending review. We discussed possible pain management referral for worsening pain, however, she declines. Could consider PT if she wishes. I have encouraged her to work on sleep hygiene. She was encouraged to use a cane or walker at all times. Fall precautions reviewed. I have encouraged her to consider grief counseling. Healthy lifestyle habits encouraged. She will follow up with care team as directed. She will return to see Dr Felecia Shelling in 6 months, sooner if needed. She verbalizes understanding and agreement with this plan.   Orders Placed This Encounter  Procedures   For  home use only DME Other see comment    Please provide patient a back brace for the pt. [phone]    Order Specific Question:   Length of Need    Answer:   12 Months   MR BRAIN W WO CONTRAST    Standing Status:   Future  Standing Expiration Date:   07/13/2023    Order Specific Question:   If indicated for the ordered procedure, I authorize the administration of contrast media per Radiology protocol    Answer:   Yes    Order Specific Question:   What is the patient's sedation requirement?    Answer:   No Sedation    Order Specific Question:   Does the patient have a pacemaker or implanted devices?    Answer:   No    Order Specific Question:   Radiology Contrast Protocol - do NOT remove file path    Answer:   [email or web address]    Order Specific Question:   Preferred imaging location?    Answer:   External     No orders of the defined types were placed in this encounter.    Angela Presto, MSN, FNP-C 07/12/2022, 2:40 PM  [facility] [address], [place], [place] [address] [phone]

## 2022-07-12 NOTE — Patient Instructions (Addendum)
Below is our plan:  We will continue current treatment plan. I will discuss you case with Dr Felecia Shelling. Please work on sleep habits. Please keep your cane with you at all times to help with instability. I do not want you to fall and get hurt.   Please make sure you are staying well hydrated. I recommend 50-60 ounces daily. Well balanced diet and regular exercise encouraged. Consistent sleep schedule with 6-8 hours recommended.   Please continue follow up with care team as directed.   Follow up with Dr Felecia Shelling in 6 months   You may receive a survey regarding today's visit. I encourage you to leave honest feed back as I do use this information to improve patient care. Thank you for seeing me today!

## 2022-07-13 ENCOUNTER — Telehealth: Payer: Self-pay | Admitting: Family Medicine

## 2022-07-13 NOTE — Telephone Encounter (Signed)
[employer] medicare/[place] medicaid NPR sent to [facility]

## 2022-08-09 ENCOUNTER — Other Ambulatory Visit: Payer: Self-pay | Admitting: Family Medicine

## 2022-08-09 MED ORDER — HYDROCODONE-ACETAMINOPHEN 10-325 MG PO TABS: ORAL_TABLET | ORAL | 0 refills | Status: DC

## 2022-08-09 NOTE — Telephone Encounter (Signed)
Pt called needing a refill request for her HYDROcodone-acetaminophen (NORCO) 10-325 MG tablet sent to the [facility] on [address] and [address]

## 2022-08-22 ENCOUNTER — Encounter: Payer: Self-pay | Admitting: Family Medicine

## 2022-09-07 ENCOUNTER — Other Ambulatory Visit: Payer: Self-pay | Admitting: Family Medicine

## 2022-09-07 MED ORDER — HYDROCODONE-ACETAMINOPHEN 10-325 MG PO TABS: ORAL_TABLET | ORAL | 0 refills | Status: DC

## 2022-09-07 NOTE — Telephone Encounter (Signed)
Pt is requesting a refill for HYDROcodone-acetaminophen (NORCO) 10-325 MG tablet.  Pharmacy:  [facility] [phone]

## 2022-09-07 NOTE — Telephone Encounter (Signed)
Reviewed pt chart. Last seen 07/12/22 and next f/u 01/16/23. Per drug registry, last refilled 08/10/22 #60.

## 2022-09-11 MED ORDER — HYDROCODONE-ACETAMINOPHEN 10-325 MG PO TABS: ORAL_TABLET | ORAL | 0 refills | Status: DC

## 2022-09-11 NOTE — Addendum Note (Signed)
Addended by: Wyvonnia Lora on: 09/11/2022 10:34 AM   Modules accepted: Orders

## 2022-09-11 NOTE — Telephone Encounter (Signed)
Pt states her pharmacy [facility] does not have the medication in stock, nor do they know when they will.  Pt is asking that the HYDROcodone-acetaminophen (NORCO) 10-325 MG tablet  Be called into [facility] [phone]

## 2022-10-06 ENCOUNTER — Encounter: Payer: Self-pay | Admitting: Neurology

## 2022-10-09 ENCOUNTER — Other Ambulatory Visit: Payer: Self-pay | Admitting: *Deleted

## 2022-10-09 DIAGNOSIS — M545 Low back pain, unspecified: Secondary | ICD-10-CM

## 2022-10-10 ENCOUNTER — Telehealth: Payer: Self-pay | Admitting: Neurology

## 2022-10-10 ENCOUNTER — Encounter: Payer: Self-pay | Admitting: Neurology

## 2022-10-10 MED ORDER — HYDROCODONE-ACETAMINOPHEN 5-325 MG PO TABS: 2.0000 | ORAL_TABLET | Freq: Two times a day (BID) | ORAL | 0 refills | Status: DC | PRN

## 2022-10-10 NOTE — Telephone Encounter (Signed)
[employer] medicare/[place] medicare NPR sent to [facility] [phone]

## 2022-11-27 ENCOUNTER — Encounter: Payer: Self-pay | Admitting: Family Medicine

## 2022-11-28 ENCOUNTER — Other Ambulatory Visit: Payer: Self-pay | Admitting: Neurology

## 2022-12-05 ENCOUNTER — Other Ambulatory Visit: Payer: Self-pay | Admitting: Family Medicine

## 2022-12-05 ENCOUNTER — Telehealth: Payer: Self-pay | Admitting: Family Medicine

## 2022-12-05 MED ORDER — HYDROCODONE-ACETAMINOPHEN 10-325 MG PO TABS: ORAL_TABLET | ORAL | 0 refills | Status: DC

## 2022-12-05 MED ORDER — GABAPENTIN 800 MG PO TABS: ORAL_TABLET | ORAL | 5 refills | Status: DC

## 2022-12-05 NOTE — Telephone Encounter (Signed)
Last seen 07/12/22 and next f/u 01/16/23. Per drug registry, last refilled 11/09/22 #60 of the 10-'325mg'$ .  I called pt. States 10-'325mg'$  back in stock. Would actually like 10-'325mg'$  sent in, not 5-'325mg'$ . Aware I will send this to MD to e-scribe.

## 2022-12-05 NOTE — Telephone Encounter (Signed)
Pt is calling. Requesting refill on medication  .HYDROcodone-acetaminophen (NORCO/VICODIN) 5-325 MG tablet  should be sent to [facility] #[id number]

## 2022-12-05 NOTE — Telephone Encounter (Signed)
E-scribed rx to [facility] as requested.

## 2022-12-05 NOTE — Telephone Encounter (Signed)
Angela Solomon is calling. Requesting refill on medication  gabapentin (NEURONTIN) 800 MG tablet. Refill should be sent to [facility] T) [phone] F) [phone]

## 2022-12-11 ENCOUNTER — Other Ambulatory Visit: Payer: Self-pay

## 2022-12-11 MED ORDER — QUETIAPINE FUMARATE 50 MG PO TABS: 50.0000 mg | ORAL_TABLET | Freq: Every day | ORAL | 11 refills | Status: DC

## 2022-12-14 ENCOUNTER — Other Ambulatory Visit: Payer: Self-pay | Admitting: Neurology

## 2022-12-16 ENCOUNTER — Ambulatory Visit
Admission: RE | Admit: 2022-12-16 | Discharge: 2022-12-16 | Disposition: A | Discharge status: Home / Self Care | Payer: Medicare Other | Source: Ambulatory Visit | Attending: Family Medicine | Admitting: Family Medicine

## 2022-12-16 ENCOUNTER — Ambulatory Visit
Admission: RE | Admit: 2022-12-16 | Discharge: 2022-12-16 | Disposition: A | Discharge status: Home / Self Care | Payer: Medicare Other | Source: Ambulatory Visit | Attending: Neurology | Admitting: Neurology

## 2022-12-16 DIAGNOSIS — M545 Low back pain, unspecified: Secondary | ICD-10-CM | POA: Diagnosis not present

## 2022-12-16 DIAGNOSIS — G35 Multiple sclerosis: Secondary | ICD-10-CM

## 2022-12-16 MED ORDER — GADOPICLENOL 0.5 MMOL/ML IV SOLN
10.0000 mL | Freq: Once | INTRAVENOUS | Status: AC | PRN
  Administered 2022-12-16: 10 mL via INTRAVENOUS

## 2022-12-20 ENCOUNTER — Telehealth: Payer: Self-pay | Admitting: *Deleted

## 2022-12-20 ENCOUNTER — Telehealth: Payer: Self-pay | Admitting: Family Medicine

## 2022-12-20 DIAGNOSIS — M545 Low back pain, unspecified: Secondary | ICD-10-CM

## 2022-12-20 DIAGNOSIS — M48061 Spinal stenosis, lumbar region without neurogenic claudication: Secondary | ICD-10-CM

## 2022-12-20 NOTE — Telephone Encounter (Signed)
-----   Message from Britt Bottom, MD sent at 12/20/2022  1:28 PM EST ----- Please let her know that the degenerative changes in the lumbar spine have progressed compared to the MRI from July 2016.  She does have severe spinal stenosis at L3-L4 (the level above her surgery).  Therefore, I would like to refer her to neurosurgery for evaluation.  We can send her to [facility] (referral surgery was by Dr. Kennon Holter who is no longer there)

## 2022-12-20 NOTE — Telephone Encounter (Signed)
Referral for Neurosurgery fax to [facility]. Phone: [phone], Fax: [phone]

## 2022-12-20 NOTE — Telephone Encounter (Signed)
Called and spoke with pt about results per Dr. Garth Bigness note. Pt verbalized understanding. Aware referral placed and to be on look out for call to schedule appt.

## 2023-01-08 ENCOUNTER — Other Ambulatory Visit: Payer: Self-pay | Admitting: Family Medicine

## 2023-01-08 MED ORDER — HYDROCODONE-ACETAMINOPHEN 10-325 MG PO TABS: ORAL_TABLET | ORAL | 0 refills | Status: DC

## 2023-01-08 NOTE — Telephone Encounter (Signed)
Pt is requesting a refill for HYDROcodone-acetaminophen (NORCO) 10-325 MG tablet.  Pharmacy: [facility] #[id number]  Pt confirmed no changes to insurance with [place]

## 2023-01-08 NOTE — Addendum Note (Signed)
Addended by: Wyvonnia Lora on: 01/08/2023 11:29 AM   Modules accepted: Orders

## 2023-01-08 NOTE — Telephone Encounter (Signed)
Last seen 07/12/22 and next f/u 01/16/23. Per drug registry, last refilled 12/08/22 #60.

## 2023-01-13 ENCOUNTER — Other Ambulatory Visit: Payer: Self-pay | Admitting: Neurology

## 2023-01-16 ENCOUNTER — Encounter: Payer: Self-pay | Admitting: Neurology

## 2023-01-16 ENCOUNTER — Ambulatory Visit (INDEPENDENT_AMBULATORY_CARE_PROVIDER_SITE_OTHER): Payer: 59 | Admitting: Neurology

## 2023-01-16 VITALS — BP 124/83 | HR 94 | Ht 73.0 in | Wt 261.8 lb

## 2023-01-16 DIAGNOSIS — M545 Low back pain, unspecified: Secondary | ICD-10-CM

## 2023-01-16 DIAGNOSIS — R269 Unspecified abnormalities of gait and mobility: Secondary | ICD-10-CM

## 2023-01-16 DIAGNOSIS — M48061 Spinal stenosis, lumbar region without neurogenic claudication: Secondary | ICD-10-CM | POA: Diagnosis not present

## 2023-01-16 DIAGNOSIS — G35 Multiple sclerosis: Secondary | ICD-10-CM

## 2023-01-16 DIAGNOSIS — G4700 Insomnia, unspecified: Secondary | ICD-10-CM

## 2023-01-16 DIAGNOSIS — G8929 Other chronic pain: Secondary | ICD-10-CM

## 2023-01-16 DIAGNOSIS — E1142 Type 2 diabetes mellitus with diabetic polyneuropathy: Secondary | ICD-10-CM

## 2023-01-16 MED ORDER — HYDROCODONE-ACETAMINOPHEN 10-325 MG PO TABS: ORAL_TABLET | ORAL | 0 refills | Status: DC

## 2023-01-16 NOTE — Progress Notes (Signed)
[facility]  PATIENT: Angela Solomon DOB: 08/19/1976  REFERRING CLINICIAN: Dr. Rita Ohara  HISTORY FROM: patient REASON FOR VISIT: MS   HISTORICAL  CHIEF COMPLAINT:  Chief Complaint  Patient presents with   Follow-up    Patient is here in room 1 for follow up MS. Patient c/o back is hurting (not a new problem) c/o muscle weakness in lower back burning feeling, right leg is weak. Reports no recent falls. Problems with sleep due discomfort.     HISTORY OF PRESENT ILLNESS:   Angela Solomon is a 47 y.o. woman with relapsing remitting multiple sclerosis and back pain.   Update 01/16/2023 She is not on a DMT now.    She was on Avonex but stopped due to other medical issues.  No recent exacerbation but she feels worse..     She reports her memory is much worse.   Also she is not sleeping well every night.   Fatigue is worse.     She is on amitriptyline, gabapentin and cyclobenzaprine for her spine pain/dysesthesias.  She notes a dry mouth and we discussed that the amitriptyline is most likely to be causing it.    She takes hydrocodone 2 pills a day.     She has noted more issues with bladder function.   She has a tight rubber band sensation in her upper thighs and abdomen/groin.    She is noting more pain and tightness in her back (including lower) when she raises her arms.  She is seeing Dr. Marcello Moores.   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  Dr. Marcello Moores has recommended ESI.       She takes cyclobenzaprine 3 times a day.  She is also on gabapentin and amitriptyline.      She tries to walk for exercise ut pain is worse when she does so.       She takes Seroquel at bedtime.    It helps he fall asleep.     She has IDDM.  She has sores in the right foot, heel and ankles.  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   She sees wound care regularly for a right heel sore.     She has been on antobiotics  She is reporting more lower back pain worse when she has a  more active day.   Pian is on the left side more than right.    She takes hydrocodone 10 mg bid.   She takes 800 mg gabapentin po tid.   She was on Flexeril and feels it helps her better than the baclofen.     She will sometimes take a hydrocodone when she gets a headache as well.   She is also on Cymbalta.    She has problems with her gait but is likely more from her pain (foot and back) more than MS. She notes reduced short term memory.  She reports urinary urgency but no incontinence.      She has lost 11 pounds this year.      MS History:   She presented in 2003 with gait ataxia, slurred speech and right sided paresthesias. Imaging studies were performed and she was diagnosed with multiple sclerosis. Initially, she was placed on Rebif but had skin reactions and switched to Copaxone. On Copaxone, she had an immediate hypersensitivity response and it was discontinued. She has been on Avonex most of the time but has been off for months at a time when she  has had insurance difficulties.      Review of MRI's: MRI brain 12/16/2022 showed Multiple T2/FLAIR hyperintense foci in the periventricular, juxtacortical and deep white matter of both hemispheres. None of the foci enhance or appear to be acute. Compared to the MRI from 12/13/2020, there are no new lesions. Normal ehancement pattern.  MRI lumbar spine 12/23/2023showed At L1-L2, there are degenerative changes causing mild to moderate spinal stenosis but no nerve root compression.    At L2-L3, there are degenerative changes and possibly congenitally short pedicles causing moderate spinal stenosis but no nerve root compression.    At L3-L4, there is severe spinal stenosis and moderately severe left greater than right lateral recess stenosis with potential for left L4 and possibly right L4 nerve root compression.    At L4-L5, level is postoperative with a Coflex device in place.  There is borderline spinal stenosis, moderate bilateral foraminal narrowing  and severe left lateral recess stenosis and moderately severe right lateral recess stenosis.  There is potential for L5 nerve root compression on either side.  There is epidural fibrosis to the left.  The epidural fibrosis is stable compared to the 07/21/2015 MRI though degenerative changes have progressed.    The degenerative changes at L1-L2, L2-L3, L3-L4 and L4-L5, progressed compared to the 2016 MRI.  MRI Brain (report) 12/13/2020 showed Motion degraded study.   Compared to 2015  Scattered foci of T2 hyperintensity within the white matter of  the cerebral hemispheres, nonspecific but consistent with the clinical diagnosis of multiple sclerosis. Bilateral frontoparietal and callosal lesions appear enlarged when compared to prior MRI, particularly the periatrial lesions. No enhancing lesion identified.   MRI Brain 04/24/2016 showed T2/FLAIR hyperintense foci, predominantly in the periventricular white matter in a pattern and configuration consistent with chronic demyelinating plaque associated with multiple sclerosis. None of the foci enhances after gadolinium administration.  There are no acute findings.   There is no change when compared to the MRI dated 09/28/2013.  MRI cervical spine 04/24/2016 showed Multiple foci within the cervical spinal cord consistent with chronic demyelinating plaque associated with multiple sclerosis. None of the foci enhances after contrast administration. There is no change when compared to the MRI dated 05/20/2014.  Mild degenerative changes at C4-C5, C5-C6 and C7-T1 that did not lead to nerve root impingement.  MRI of the cervical spine dated 05/20/2014. It showed enhancing spinal cord lesions consistent with MS. C2-C3 there was a left lateral focus. Also at C2-C3 there was a posterior focus. At C3-C4 there was a right posterior lateral focus. At C4 there was a dorsal focus and at C5 there was another dorsal focus. 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 There are  multilevel degenerative changes most pronounced at C7-T1 where there could be dynamic impingement of the right C8 nerve root.   MRI of the brain dated 09/28/2013 showing diffuse white matter foci in the periventricular and deep white matter consistent with [place].  Although there were no enhancing foci, when compared to a previous MRI dated 10/11/2010, there was some progression.   MRI of the lumbar spine dated 04/27/2015 showed a large disc extrusion with sequestered fragment causing bilateral L5 nerve root compression.    MRI of the brain with and without contrast dated 10/16/2008 showed enhancing lesions.  She was reportedly off medication at that time.   REVIEW OF SYSTEMS:  Constitutional: No fevers, chills, sweats, or change in appetite.   Fatigue, poor sleep Eyes: No visual changes, double  vision, eye pain Ear, nose and throat: No hearing loss, ear pain, nasal congestion, sore throat Cardiovascular: No chest pain, palpitations Respiratory:  No shortness of breath at rest or with exertion.   No wheezes GastrointestinaI: No nausea, vomiting, diarrhea, abdominal pain, fecal incontinence Genitourinary:  No dysuria, urinary retention or frequency.  No nocturia. Musculoskeletal:  as above. Integumentary: No rash, pruritus, skin lesions Neurological: as above Psychiatric: No depression at this time.  No anxiety Endocrine: No palpitations, diaphoresis, change in appetite, change in weigh or increased thirst Hematologic/Lymphatic:  No anemia, purpura, petechiae. Allergic/Immunologic: No itchy/runny eyes, nasal congestion, recent allergic reactions, rashes  ALLERGIES: Allergies  Allergen Reactions   Atorvastatin Other (See Comments)    Myalgia Myalgia   Glatiramer Anaphylaxis    Generic Copaxone   Lisinopril Anaphylaxis   Niacin Diarrhea   Valproic Acid Anaphylaxis   Buprenorphine Hcl Hives   Morphine And Related Hives   Aspirin Nausea And Vomiting    Fevers and GI upset    Hydrocodone-Acetaminophen Nausea And Vomiting    Pt states can take Hydrocodone but has a reaction to a additive in vicodin   Zithromax [Azithromycin] Rash    HOME MEDICATIONS: Outpatient Medications Prior to Visit  Medication Sig Dispense Refill   albuterol (PROVENTIL HFA;VENTOLIN HFA) 108 (90 BASE) MCG/ACT inhaler Inhale 1 puff into the lungs as needed for wheezing or shortness of breath. Reported on 04/06/2016     amitriptyline (ELAVIL) 75 MG tablet TAKE 1 TABLET(75 MG) BY MOUTH AT BEDTIME 30 tablet 5   B-D INS SYR ULTRAFINE 1CC/31G 31G X 5/16" 1 ML MISC See admin instructions.  0   carvedilol (COREG) 25 MG tablet Take 25 mg by mouth 2 (two) times daily.      cyclobenzaprine (FLEXERIL) 10 MG tablet TAKE 1 TABLET(10 MG) BY MOUTH THREE TIMES DAILY AS NEEDED FOR MUSCLE SPASMS 90 tablet 5   DULoxetine (CYMBALTA) 60 MG capsule Take 60 mg by mouth daily.     empagliflozin (JARDIANCE) 10 MG TABS tablet Take 25 mg by mouth daily.      ferrous sulfate 325 (65 FE) MG tablet Take 325 mg by mouth daily.      fluconazole (DIFLUCAN) 150 MG tablet as needed.   0   fluticasone (FLONASE) 50 MCG/ACT nasal spray Place 2 sprays into both nostrils as needed for allergies.      gabapentin (NEURONTIN) 800 MG tablet TAKE 1 TABLET(800 MG) BY MOUTH THREE TIMES DAILY. 90 tablet 5   insulin glargine (LANTUS) 100 UNIT/ML injection Inject 50 Units into the skin 2 (two) times daily.      interferon beta-1a (AVONEX) 30 MCG injection Inject 30 mcg into the muscle every 7 (seven) days. 12 each 3   losartan-hydrochlorothiazide (HYZAAR) 100-25 MG per tablet Take 1 tablet by mouth daily.      pantoprazole (PROTONIX) 40 MG tablet Take 40 mg by mouth at bedtime.      promethazine (PHENERGAN) 25 MG tablet Take 1 tablet (25 mg total) by mouth every 6 (six) hours as needed for nausea or vomiting. 30 tablet 1   QUEtiapine (SEROQUEL) 50 MG tablet TAKE 1 TABLET BY MOUTH AT BEDTIME 30 tablet 2   rosuvastatin (CRESTOR) 20 MG tablet       HYDROcodone-acetaminophen (NORCO) 10-325 MG tablet Take 1 tablet by mouth twice daily as needed. 60 tablet 0   amLODipine (NORVASC) 5 MG tablet Take 5 mg by mouth.     No facility-administered medications prior to visit.  PAST MEDICAL HISTORY: Past Medical History:  Diagnosis Date   Anemia    Depression    Diabetes mellitus without complication (HCC)    dx at age 46  type 1   GERD (gastroesophageal reflux disease)    Headache    Hypertension    Multiple sclerosis ([place])    Neuromuscular disorder ([place])    dx 2003 with MS   Neuropathy    PONV (postoperative nausea and vomiting)    sometimes..........Marland Kitchen   Vision abnormalities     PAST SURGICAL HISTORY: Past Surgical History:  Procedure Laterality Date   ABDOMINAL HYSTERECTOMY     dec 2021   CHOLECYSTECTOMY     DENTAL SURGERY     LUMBAR LAMINECTOMY WITH COFLEX 1 LEVEL Bilateral 06/29/2015   Procedure: LUMBAR LAMINECTOMY WITH COFLEX 1 LEVEL;  Surgeon: Karie Chimera, MD;  Location: [facility];  Service: Neurosurgery;  Laterality: Bilateral;  LUMBAR LAMINECTOMY WITH COFLEX 1 LEVEL   right foot surgery     remove a piece of glass  (had c/o for about 1 yr)   TONSILLECTOMY     TUBAL LIGATION     WOUND EXPLORATION N/A 07/22/2015   Procedure: WOUND EXPLORATION;  Surgeon: Karie Chimera, MD;  Location: [place] [facility];  Service: Neurosurgery;  Laterality: N/A;    FAMILY HISTORY: Family History  Problem Relation Age of Onset   Hypertension Mother    Diabetes Mother    Stroke Mother    Heart attack Mother    Peripheral vascular disease Mother     SOCIAL HISTORY:  Social History   Socioeconomic History   Marital status: Married    Spouse name: Not on file   Number of children: Not on file   Years of education: Not on file   Highest education level: Not on file  Occupational History   Not on file  Tobacco Use   Smoking status: Every Day    Packs/day: 0.50    Years: 24.00    Total pack years: 12.00    Types:  Cigarettes   Smokeless tobacco: Never  Substance and Sexual Activity   Alcohol use: Not Currently    Alcohol/week: 0.0 standard drinks of alcohol    Comment: occasional   Drug use: No   Sexual activity: Not on file  Other Topics Concern   Not on file  Social History Narrative   Not on file   Social Determinants of Health   Financial Resource Strain: Not on file  Food Insecurity: Not on file  Transportation Needs: Not on file  Physical Activity: Not on file  Stress: Not on file  Social Connections: Not on file  Intimate Partner Violence: Not on file     PHYSICAL EXAM  Vitals:   01/16/23 1455  BP: 124/83  Pulse: 94  Weight: 261 lb 12.8 oz (118.8 kg)  Height: '6\' 1"'$  (1.854 m)    Body mass index is 34.54 kg/m.   General: The patient is well-developed and well-nourished and in no acute distress   Lower Back: She has some tenderness in the lower lumbar paraspinal muscles.  Neurologic Exam  Mental status: She is alert and oriented. The affect is normal.. She has no difficulty with speech. Focus is mildly reduced short-term memory seems appropriate.  Cranial nerves: Extraocular movements are full.  Facial strength and sensation is normal.  Trapezius strength is normal.  . Palatal elevation of protrusion is midline. Hearing is normal and symmetric.    Motor:  Muscle bulk and  tone are normal. Strength was normal in the right arm and she had slight weakness 4+/5 in the ulnar innervated muscles of the left. Strength is 4+/5 in the toes. The left great toe has been surgically removed   Sensory: She reports reduced sensation to vibration iand touch at ankles, very reduced at toes  Coordination: Cerebellar testing reveals good finger-nose-finger but poor heel-to-shin bilaterally.  Gait and station: Station is wide.  Gait is difficult to assess due to her right boot and recent surgery.    Reflexes: Deep tendon reflexes are symmetric in her arms and legs (trace ankle)..    _________________________________  Chronic low back pain, unspecified back pain laterality, unspecified whether sciatica present  Spinal stenosis of lumbar region, unspecified whether neurogenic claudication present  Multiple sclerosis (HCC)  Insomnia, unspecified type  Gait disturbance  Diabetic polyneuropathy associated with type 2 diabetes mellitus ([place])   1.   For now she will remain off of a disease modifying therapy.  She will need to reconsider if relapses. 2.   Lumbar spine is worse, with severe L3L4 > L2L3 spinal stenosis.  Will increase hydrocodone to 3 times a day (will use current script but allow a refill with #90 around 01/30/2023)  .  I recommend that she proceed with the ESI and may eed surgery if benefit is short-lived.   3.  Amitriptyline, Cymbalta, and cyclobenzaprine may cause her dry mouth but due to worsening pain, will ontinue for now.   4.   Stay active and exercise as tolerated. 5.   Continue to use the shower chair  6.   rtc 6 months, sooner if problems  40-minute office visit with the majority of the time spent face-to-face for history and physical, discussion/counseling and decision-making.  Additional time with record review and documentation.   Angela Solomon A. Felecia Shelling, MD, PhD 1/74/9449, 6:75 PM Certified in Neurology, Clinical Neurophysiology, Sleep Medicine, Pain Medicine and Neuroimaging  [facility] [address], [place], [place] [address] [phone]

## 2023-03-05 ENCOUNTER — Other Ambulatory Visit: Payer: Self-pay | Admitting: Neurology

## 2023-03-05 MED ORDER — HYDROCODONE-ACETAMINOPHEN 10-325 MG PO TABS: ORAL_TABLET | ORAL | 0 refills | Status: DC

## 2023-03-05 NOTE — Telephone Encounter (Addendum)
Patient 01/16/2023 Follow up scheduled on 07/17/23 Hydrocodone last filled on 01/31/23 # 90 tablets (30 days) Rx pending to be signed

## 2023-03-05 NOTE — Telephone Encounter (Signed)
Pt is requesting a refill for HYDROcodone-acetaminophen (NORCO) 10-325 MG tablet.  Pharmacy:  [id number]   

## 2023-03-05 NOTE — Addendum Note (Signed)
Addended by: Thamas Jaegers on: 03/05/2023 03:32 PM   Modules accepted: Orders

## 2023-04-04 ENCOUNTER — Other Ambulatory Visit: Payer: Self-pay | Admitting: Neurology

## 2023-04-04 MED ORDER — HYDROCODONE-ACETAMINOPHEN 10-325 MG PO TABS: ORAL_TABLET | ORAL | 0 refills | Status: DC

## 2023-04-04 NOTE — Telephone Encounter (Signed)
Pt called needing a refill on her  HYDROcodone-acetaminophen (NORCO) 10-325 MG tablet sent to [facility] [address]

## 2023-04-04 NOTE — Telephone Encounter (Signed)
Last seen 01/16/23 and next f/u 07/17/23. Last refilled 03/05/23 #30.

## 2023-04-06 ENCOUNTER — Other Ambulatory Visit: Payer: Self-pay | Admitting: Neurology

## 2023-05-04 ENCOUNTER — Other Ambulatory Visit: Payer: Self-pay | Admitting: Neurology

## 2023-05-04 MED ORDER — HYDROCODONE-ACETAMINOPHEN 10-325 MG PO TABS: ORAL_TABLET | ORAL | 0 refills | Status: DC

## 2023-05-04 NOTE — Telephone Encounter (Signed)
Pt requesting a refill on HYDROcodone-acetaminophen (NORCO) 10-325 MG tablet. Refill should be sent to [facility] #[id number]

## 2023-05-04 NOTE — Telephone Encounter (Signed)
Pt last seen 01/16/23 and next f/u 07/17/23. Last refilled 04/04/23 #90.

## 2023-05-07 ENCOUNTER — Other Ambulatory Visit: Payer: Self-pay | Admitting: Neurology

## 2023-05-08 NOTE — Telephone Encounter (Signed)
Last seen on 01/16/23 per note "  Amitriptyline, Cymbalta, and cyclobenzaprine may cause her dry mouth but due to worsening pain, will ontinue for now "  Follow up scheduled on 07/17/23   Rx last last filled on 05/07/23 #90 tablets (30 day supply)

## 2023-06-04 ENCOUNTER — Other Ambulatory Visit: Payer: Self-pay | Admitting: Neurology

## 2023-06-04 MED ORDER — HYDROCODONE-ACETAMINOPHEN 10-325 MG PO TABS: ORAL_TABLET | ORAL | 0 refills | Status: DC

## 2023-06-04 NOTE — Telephone Encounter (Signed)
Please refill HYDROcodone-acetaminophen (NORCO) 10-325 MG tablet send to [facility].

## 2023-06-04 NOTE — Addendum Note (Signed)
Addended by: Arther Abbott on: 06/04/2023 10:27 AM   Modules accepted: Orders

## 2023-06-04 NOTE — Telephone Encounter (Signed)
Last seen 01/16/23 and next f/u 07/17/23.  Last refilled 05/04/23 #90.

## 2023-06-06 ENCOUNTER — Other Ambulatory Visit: Payer: Self-pay | Admitting: Neurology

## 2023-07-03 ENCOUNTER — Other Ambulatory Visit: Payer: Self-pay | Admitting: Neurology

## 2023-07-03 ENCOUNTER — Other Ambulatory Visit: Payer: Self-pay

## 2023-07-03 MED ORDER — HYDROCODONE-ACETAMINOPHEN 10-325 MG PO TABS: ORAL_TABLET | ORAL | 0 refills | Status: DC

## 2023-07-03 NOTE — Telephone Encounter (Signed)
Pt last seen 01/16/2023 Upcoming Appointment 07/17/2023   Hydrocodone last Filled 06/04/2023 Escript 07/03/2023

## 2023-07-03 NOTE — Telephone Encounter (Signed)
Pt is requesting a refill for HYDROcodone-acetaminophen (NORCO) 10-325 MG tablet .  Pharmacy: [facility] #[id number]   

## 2023-07-03 NOTE — Addendum Note (Signed)
Addended by: Leandra Kern R on: 07/03/2023 01:10 PM   Modules accepted: Orders

## 2023-07-04 MED ORDER — HYDROCODONE-ACETAMINOPHEN 10-325 MG PO TABS: ORAL_TABLET | ORAL | 0 refills | Status: DC

## 2023-07-04 NOTE — Telephone Encounter (Signed)
Pt's refill request shows to have been sent to  [facility]  Pt requested the HYDROcodone-acetaminophen (NORCO) 10-325 MG tablet   be called into [facility] #[id number]

## 2023-07-04 NOTE — Telephone Encounter (Addendum)
Dr.Sater the Rx was sent to the incorrect pharmacy on 07/03/23 it was sent to [facility]. I called and asked them to cancel Rx. I was told by pharmacy tech Rx would not be filled because its a narcotic and they don't fill them there. Reference # for call was  pharmacy tech [phone] W    Dr.Sater please resend Rx, I have it set to correct pharmacy.

## 2023-07-04 NOTE — Addendum Note (Signed)
Addended by: Asa Lente on: 07/04/2023 01:38 PM   Modules accepted: Orders

## 2023-07-04 NOTE — Addendum Note (Signed)
Addended by: Aura Camps on: 07/04/2023 01:30 PM   Modules accepted: Orders

## 2023-07-17 ENCOUNTER — Encounter: Payer: Self-pay | Admitting: Neurology

## 2023-07-17 ENCOUNTER — Ambulatory Visit (INDEPENDENT_AMBULATORY_CARE_PROVIDER_SITE_OTHER): Payer: 59 | Admitting: Neurology

## 2023-07-17 VITALS — BP 124/79 | HR 96 | Ht 73.0 in | Wt 260.5 lb

## 2023-07-17 DIAGNOSIS — R269 Unspecified abnormalities of gait and mobility: Secondary | ICD-10-CM | POA: Diagnosis not present

## 2023-07-17 DIAGNOSIS — G35 Multiple sclerosis: Secondary | ICD-10-CM | POA: Diagnosis not present

## 2023-07-17 DIAGNOSIS — M48061 Spinal stenosis, lumbar region without neurogenic claudication: Secondary | ICD-10-CM

## 2023-07-17 DIAGNOSIS — E1142 Type 2 diabetes mellitus with diabetic polyneuropathy: Secondary | ICD-10-CM

## 2023-07-17 DIAGNOSIS — Z72821 Inadequate sleep hygiene: Secondary | ICD-10-CM

## 2023-07-17 NOTE — Progress Notes (Signed)
[facility]  PATIENT: Angela Solomon DOB: 03-23-1976  REFERRING CLINICIAN: Dr. Royanne Foots  HISTORY FROM: patient REASON FOR VISIT: MS   HISTORICAL  CHIEF COMPLAINT:  Chief Complaint  Patient presents with   Room 11    Pt is here Alone. Pt states that things have been rough since her last appointment, she hasn't been able to sleep due to her Husband being on a ventilator.     HISTORY OF PRESENT ILLNESS:   Angela Solomon is a 47 y.o. woman with relapsing remitting multiple sclerosis and back pain.   Update 06/27/2023 She is not on a DMT now.    She stopped Avonex last year due to feeling down and other medical issues.    No recent exacerbation.   MRI 12/23 unchanged compared to12/ 2021.    She feels gait is stable with continued reduced balance..   No falls.  She can walk about 100-200 feet without a break.   She denies lim weakness.notes sme dysesthesias in limbs and a lot of back pain  She reports her memory is much worse.   Also she is not sleeping well every night.   Fatigue is worse.     She is on amitriptyline, gabapentin and cyclobenzaprine for her spine pain/dysesthesias.  She notes a dry mouth and we discussed that the amitriptyline is most likely to be causing it.    She takes hydrocodone 2 pills a day.     She has noted more issues with bladder function.   She has a tight rubber band sensation in her upper thighs and abdomen/groin.    She is noting more pain and tightness in her back  and hips.   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  Dr. Maisie Fus has recommended ESI.       She takes cyclobenzaprine 3 times a day.  She is also on gabapentin and amitriptyline.      She tries to walk for exercise ut pain is worse when she does so.       She sometimes falls asleep during the day.   She feels more sleepy today due to poor sleep last night.    We discussed she is on several medications that could contribute.    She takes Seroquel at bedtime.    It  helps he fall asleep.     She has IDDM.   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   She saw wound care last year for a right heel sore.     She is reporting more lower back pain worse when she has a more active day.   Pian is on the left side more than right.    She takes hydrocodone 10 mg bid.   She takes 800 mg gabapentin po tid and cyclobenzaprine 10 mg po tid .  She feels Flexeril helped more than baclofen.   She takes amitriptyline at bedtime and is also on Cymbalta.    She snores.  Some days she feels sleepy and other days feels fine.   No witnessed OSA signs.     EPWORTH SLEEPINESS SCALE  On a scale of 0 - 3 what is the chance of dozing:  Sitting and Reading:   2 Watching TV:    2 Sitting inactive in a public place: 1 Passenger in car for one hour: 1 Lying down to rest in the afternoon: 1 Sitting and talking to someone: 0 Sitting quietly after lunch:  1 In  a car, stopped in traffic:  0  Total (out of 24):   8/24   not excessively sleepy   MS History:   She presented in 2003 with gait ataxia, slurred speech and right sided paresthesias. Imaging studies were performed and she was diagnosed with multiple sclerosis. Initially, she was placed on Rebif but had skin reactions and switched to Copaxone. On Copaxone, she had an immediate hypersensitivity response and it was discontinued. She has been on Avonex most of the time but has been off for months at a time when she has had insurance difficulties.      Review of MRI's: MRI brain 12/16/2022 showed Multiple T2/FLAIR hyperintense foci in the periventricular, juxtacortical and deep white matter of both hemispheres. None of the foci enhance or appear to be acute. Compared to the MRI from 12/13/2020, there are no new lesions. Normal ehancement pattern.  MRI lumbar spine 12/23/2023showed At L1-L2, there are degenerative changes causing mild to moderate spinal stenosis but no nerve root compression.    At L2-L3, there are degenerative  changes and possibly congenitally short pedicles causing moderate spinal stenosis but no nerve root compression.    At L3-L4, there is severe spinal stenosis and moderately severe left greater than right lateral recess stenosis with potential for left L4 and possibly right L4 nerve root compression.    At L4-L5, level is postoperative with a Coflex device in place.  There is borderline spinal stenosis, moderate bilateral foraminal narrowing and severe left lateral recess stenosis and moderately severe right lateral recess stenosis.  There is potential for L5 nerve root compression on either side.  There is epidural fibrosis to the left.  The epidural fibrosis is stable compared to the 07/21/2015 MRI though degenerative changes have progressed.    The degenerative changes at L1-L2, L2-L3, L3-L4 and L4-L5, progressed compared to the 2016 MRI.  MRI Brain (report) 12/13/2020 showed Motion degraded study.   Compared to 2015  Scattered foci of T2 hyperintensity within the white matter of  the cerebral hemispheres, nonspecific but consistent with the clinical diagnosis of multiple sclerosis. Bilateral frontoparietal and callosal lesions appear enlarged when compared to prior MRI, particularly the periatrial lesions. No enhancing lesion identified.   MRI Brain 04/24/2016 showed T2/FLAIR hyperintense foci, predominantly in the periventricular white matter in a pattern and configuration consistent with chronic demyelinating plaque associated with multiple sclerosis. None of the foci enhances after gadolinium administration.  There are no acute findings.   There is no change when compared to the MRI dated 09/28/2013.  MRI cervical spine 04/24/2016 showed Multiple foci within the cervical spinal cord consistent with chronic demyelinating plaque associated with multiple sclerosis. None of the foci enhances after contrast administration. There is no change when compared to the MRI dated 05/20/2014.  Mild degenerative changes at  C4-C5, C5-C6 and C7-T1 that did not lead to nerve root impingement.  MRI of the cervical spine dated 05/20/2014. It showed enhancing spinal cord lesions consistent with MS. C2-C3 there was a left lateral focus. Also at C2-C3 there was a posterior focus. At C3-C4 there was a right posterior lateral focus. At C4 there was a dorsal focus and at C5 there was another dorsal focus. 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 There are multilevel degenerative changes most pronounced at C7-T1 where there could be dynamic impingement of the right C8 nerve root.   MRI of the brain dated 09/28/2013 showing diffuse white matter foci in the periventricular and deep white  matter consistent with [place].  Although there were no enhancing foci, when compared to a previous MRI dated 10/11/2010, there was some progression.   MRI of the lumbar spine dated 04/27/2015 showed a large disc extrusion with sequestered fragment causing bilateral L5 nerve root compression.    MRI of the brain with and without contrast dated 10/16/2008 showed enhancing lesions.  She was reportedly off medication at that time.   REVIEW OF SYSTEMS:  Constitutional: No fevers, chills, sweats, or change in appetite.   Fatigue, poor sleep Eyes: No visual changes, double vision, eye pain Ear, nose and throat: No hearing loss, ear pain, nasal congestion, sore throat Cardiovascular: No chest pain, palpitations Respiratory:  No shortness of breath at rest or with exertion.   No wheezes GastrointestinaI: No nausea, vomiting, diarrhea, abdominal pain, fecal incontinence Genitourinary:  No dysuria, urinary retention or frequency.  No nocturia. Musculoskeletal:  as above. Integumentary: No rash, pruritus, skin lesions Neurological: as above Psychiatric: No depression at this time.  No anxiety Endocrine: No palpitations, diaphoresis, change in appetite, change in weigh or increased thirst Hematologic/Lymphatic:  No anemia, purpura,  petechiae. Allergic/Immunologic: No itchy/runny eyes, nasal congestion, recent allergic reactions, rashes  ALLERGIES: Allergies  Allergen Reactions   Atorvastatin Other (See Comments)    Myalgia Myalgia   Glatiramer Anaphylaxis    Generic Copaxone   Lisinopril Anaphylaxis   Niacin Diarrhea   Valproic Acid Anaphylaxis   Buprenorphine Hcl Hives   Morphine And Codeine Hives   Aspirin Nausea And Vomiting    Fevers and GI upset   Hydrocodone-Acetaminophen Nausea And Vomiting    Pt states can take Hydrocodone but has a reaction to a additive in vicodin   Zithromax [Azithromycin] Rash    HOME MEDICATIONS: Outpatient Medications Prior to Visit  Medication Sig Dispense Refill   albuterol (PROVENTIL HFA;VENTOLIN HFA) 108 (90 BASE) MCG/ACT inhaler Inhale 1 puff into the lungs as needed for wheezing or shortness of breath. Reported on 04/06/2016     amitriptyline (ELAVIL) 75 MG tablet TAKE 1 TABLET (75 MG) BY MOUTH AT BEDTIME 30 tablet 3   B-D INS SYR ULTRAFINE 1CC/31G 31G X 5/16" 1 ML MISC See admin instructions.  0   carvedilol (COREG) 25 MG tablet Take 25 mg by mouth 2 (two) times daily.      cyclobenzaprine (FLEXERIL) 10 MG tablet TAKE 1 TABLET BY MOUTH THREE TIMES DAILY AS NEEDED FOR MUSCLE SPASMS 90 tablet 10   DULoxetine (CYMBALTA) 60 MG capsule Take 60 mg by mouth daily.     empagliflozin (JARDIANCE) 10 MG TABS tablet Take 25 mg by mouth daily.      ferrous sulfate 325 (65 FE) MG tablet Take 325 mg by mouth daily.      fluconazole (DIFLUCAN) 150 MG tablet as needed.   0   fluticasone (FLONASE) 50 MCG/ACT nasal spray Place 2 sprays into both nostrils as needed for allergies.      gabapentin (NEURONTIN) 800 MG tablet TAKE 1 TABLET(800 MG) BY MOUTH THREE TIMES DAILY. 90 tablet 5   HYDROcodone-acetaminophen (NORCO) 10-325 MG tablet Take 1 tablet by mouth three times daily as needed. 90 tablet 0   insulin glargine (LANTUS) 100 UNIT/ML injection Inject 50 Units into the skin 2 (two) times  daily.      interferon beta-1a (AVONEX) 30 MCG injection Inject 30 mcg into the muscle every 7 (seven) days. 12 each 3   losartan-hydrochlorothiazide (HYZAAR) 100-25 MG per tablet Take 1 tablet by mouth daily.  pantoprazole (PROTONIX) 40 MG tablet Take 40 mg by mouth at bedtime.      promethazine (PHENERGAN) 25 MG tablet Take 1 tablet (25 mg total) by mouth every 6 (six) hours as needed for nausea or vomiting. 30 tablet 1   QUEtiapine (SEROQUEL) 50 MG tablet TAKE 1 TABLET BY MOUTH AT BEDTIME 30 tablet 3   rosuvastatin (CRESTOR) 20 MG tablet      amLODipine (NORVASC) 5 MG tablet Take 5 mg by mouth.     No facility-administered medications prior to visit.    PAST MEDICAL HISTORY: Past Medical History:  Diagnosis Date   Anemia    Depression    Diabetes mellitus without complication (HCC)    dx at age 66  type 1   GERD (gastroesophageal reflux disease)    Headache    Hypertension    Multiple sclerosis (HCC)    Neuromuscular disorder (HCC)    dx 2003 with MS   Neuropathy    PONV (postoperative nausea and vomiting)    sometimes..........Marland Kitchen   Vision abnormalities     PAST SURGICAL HISTORY: Past Surgical History:  Procedure Laterality Date   ABDOMINAL HYSTERECTOMY     dec 2021   CHOLECYSTECTOMY     DENTAL SURGERY     LUMBAR LAMINECTOMY WITH COFLEX 1 LEVEL Bilateral 06/29/2015   Procedure: LUMBAR LAMINECTOMY WITH COFLEX 1 LEVEL;  Surgeon: Aliene Beams, MD;  Location: [facility];  Service: Neurosurgery;  Laterality: Bilateral;  LUMBAR LAMINECTOMY WITH COFLEX 1 LEVEL   right foot surgery     remove a piece of glass  (had c/o for about 1 yr)   TONSILLECTOMY     TUBAL LIGATION     WOUND EXPLORATION N/A 07/22/2015   Procedure: WOUND EXPLORATION;  Surgeon: Aliene Beams, MD;  Location: [facility];  Service: Neurosurgery;  Laterality: N/A;    FAMILY HISTORY: Family History  Problem Relation Age of Onset   Hypertension Mother    Diabetes Mother    Stroke Mother    Heart  attack Mother    Peripheral vascular disease Mother     SOCIAL HISTORY:  Social History   Socioeconomic History   Marital status: Married    Spouse name: Not on file   Number of children: Not on file   Years of education: Not on file   Highest education level: Not on file  Occupational History   Not on file  Tobacco Use   Smoking status: Every Day    Current packs/day: 0.50    Average packs/day: 0.5 packs/day for 24.0 years (12.0 ttl pk-yrs)    Types: Cigarettes   Smokeless tobacco: Never  Substance and Sexual Activity   Alcohol use: Not Currently    Alcohol/week: 0.0 standard drinks of alcohol    Comment: occasional   Drug use: No   Sexual activity: Not on file  Other Topics Concern   Not on file  Social History Narrative   Not on file   Social Determinants of Health   Financial Resource Strain: Not on file  Food Insecurity: Not on file  Transportation Needs: Not on file  Physical Activity: Not on file  Stress: Not on file  Social Connections: Unknown (03/07/2023)   Received from [facility]   Social Network    Social Network: Not on file  Intimate Partner Violence: Unknown (03/07/2023)   Received from [facility]   HITS    Physically Hurt: Not on file    Insult or Talk Down To:  Not on file    Threaten Physical Harm: Not on file    Scream or Curse: Not on file     PHYSICAL EXAM  Vitals:   07/17/23 1536  BP: 124/79  Pulse: 96  Weight: 260 lb 8 oz (118.2 kg)  Height: 6\' 1"  (1.854 m)    Body mass index is 34.37 kg/m.   General: The patient is well-developed and well-nourished and in no acute distress   Lower Back: She has some tenderness in the lower lumbar paraspinal muscles.  Neurologic Exam  Mental status: She is alert and oriented. The affect is normal.. She has no difficulty with speech. Focus is mildly reduced short-term memory seems appropriate.  Cranial nerves: Extraocular movements are full.  Facial strength and sensation is normal.   Trapezius strength is normal.  . Palatal elevation of protrusion is midline. Hearing is normal and symmetric.    Motor:  Muscle bulk and tone are normal. Strength was normal in the right arm and she had slight weakness 4+/5 in the ulnar innervated muscles of the left. Strength is 4+/5 in the toes. The left great toe has been surgically removed   Sensory: She reports reduced sensation to vibration iand touch at ankles, very reduced at toes  Coordination: Cerebellar testing reveals good finger-nose-finger but poor heel-to-shin bilaterally.  Gait and station: Station is wide.  Gait is difficult to assess due to her right boot and recent surgery.    Reflexes: Deep tendon reflexes are symmetric in her arms and legs (trace ankle)..   _________________________________  Multiple sclerosis (HCC)  Spinal stenosis of lumbar region, unspecified whether neurogenic claudication present  Gait disturbance  Diabetic polyneuropathy associated with type 2 diabetes mellitus (HCC)  Poor sleep hygiene   1.   For now she will remain off of a disease modifying therapy.  She will need to reconsider if relapses.   Recheck MRI in 2025 2.   Lumbar spine is worse, with severe L3L4 > L2L3 spinal stenosis.  She will continue hydrocodone 3 times a day and flexeril.    3.  If LBP pain and leg pain worsens advise to see Neurosurgery back.   4.   Stay active and exercise as tolerated. 5.    rtc 6 months, sooner if problems  This visit is part of a comprehensive longitudinal care medical relationship regarding the patients primary diagnosis of MS and related concerns.   Kursten Kruk A. Epimenio Foot, MD, PhD 07/17/2023, 4:03 PM Certified in Neurology, Clinical Neurophysiology, Sleep Medicine, Pain Medicine and Neuroimaging  [facility] [address] [place], [place] [address] [phone]

## 2023-08-06 ENCOUNTER — Other Ambulatory Visit: Payer: Self-pay | Admitting: Neurology

## 2023-08-06 MED ORDER — HYDROCODONE-ACETAMINOPHEN 10-325 MG PO TABS: ORAL_TABLET | ORAL | 0 refills | Status: DC

## 2023-08-06 NOTE — Telephone Encounter (Signed)
Pt is requesting a refill for HYDROcodone-acetaminophen (NORCO) 10-325 MG tablet.  Pharmacy:  [id number]   

## 2023-08-06 NOTE — Telephone Encounter (Signed)
Last seen on 07/17/23 Follow up scheduled 01/16/23 Last filled on 07/04/23 #90 tablets (30 day supply) Rx pending to be signed

## 2023-08-07 NOTE — Telephone Encounter (Signed)
Last seen on 07/17/23 Follow up scheduled on 01/17/24

## 2023-09-04 ENCOUNTER — Other Ambulatory Visit: Payer: Self-pay | Admitting: Neurology

## 2023-09-04 MED ORDER — HYDROCODONE-ACETAMINOPHEN 10-325 MG PO TABS: ORAL_TABLET | ORAL | 0 refills | Status: DC

## 2023-09-04 NOTE — Addendum Note (Signed)
Addended by: Aura Camps on: 09/04/2023 03:54 PM   Modules accepted: Orders

## 2023-09-04 NOTE — Telephone Encounter (Signed)
Pt is requesting a refill for HYDROcodone-acetaminophen (NORCO) 10-325 MG tablet.  Pharmacy:  [id number]   

## 2023-09-04 NOTE — Telephone Encounter (Addendum)
Last seen on 07/17/23 Follow up scheduled on 01/17/24 Last filled on 08/06/23 #90 tablets (30 day supply) Rx pending to be signed

## 2023-09-04 NOTE — Addendum Note (Signed)
Addended by: Aura Camps on: 09/04/2023 03:55 PM   Modules accepted: Orders

## 2023-10-05 ENCOUNTER — Other Ambulatory Visit: Payer: Self-pay | Admitting: Neurology

## 2023-10-05 NOTE — Telephone Encounter (Signed)
Pt called needing a refill on her  HYDROcodone-acetaminophen (NORCO) 10-325 MG tablet and having it sent to the [facility] on [address]

## 2023-10-08 ENCOUNTER — Other Ambulatory Visit: Payer: Self-pay

## 2023-10-08 MED ORDER — HYDROCODONE-ACETAMINOPHEN 10-325 MG PO TABS: ORAL_TABLET | ORAL | 0 refills | Status: DC

## 2023-10-08 NOTE — Telephone Encounter (Signed)
Last seen 07/17/23 and next f/u 01/17/24. Last refilled rx 09/06/23 #90.

## 2023-10-11 ENCOUNTER — Other Ambulatory Visit: Payer: Self-pay | Admitting: *Deleted

## 2023-10-11 MED ORDER — GABAPENTIN 800 MG PO TABS: ORAL_TABLET | ORAL | 0 refills | Status: DC

## 2023-10-11 NOTE — Telephone Encounter (Signed)
Last seen on 07/17/23 Follow up scheduled on 01/17/24

## 2023-11-03 ENCOUNTER — Other Ambulatory Visit: Payer: Self-pay | Admitting: Neurology

## 2023-11-05 NOTE — Telephone Encounter (Signed)
Last seen on 07/17/23 per note " She takes 800 mg gabapentin po tid " Follow up scheduled on 01/17/24 Last filled on 11/02/23 #90 tablets (30 day supply) Too soon to refill

## 2023-11-06 ENCOUNTER — Other Ambulatory Visit: Payer: Self-pay | Admitting: Neurology

## 2023-11-06 MED ORDER — HYDROCODONE-ACETAMINOPHEN 10-325 MG PO TABS: ORAL_TABLET | ORAL | 0 refills | Status: DC

## 2023-11-06 NOTE — Telephone Encounter (Signed)
Last seen on 07/17/23 Follow up scheduled on 01/17/24 Last filled on 10/08/23 #90 tablets (30 day supply) Rx pending to be signed

## 2023-11-06 NOTE — Telephone Encounter (Signed)
Pt called requesting a refill request for her  HYDROcodone-acetaminophen (NORCO) 10-325 MG table and is needing it sent to the [facility] on [address]

## 2023-11-21 ENCOUNTER — Other Ambulatory Visit: Payer: Self-pay | Admitting: Neurology

## 2023-12-05 ENCOUNTER — Other Ambulatory Visit: Payer: Self-pay | Admitting: Neurology

## 2023-12-05 MED ORDER — HYDROCODONE-ACETAMINOPHEN 10-325 MG PO TABS: ORAL_TABLET | ORAL | 0 refills | Status: DC

## 2023-12-05 NOTE — Telephone Encounter (Signed)
Pt requesting refill of HYDROcodone-acetaminophen (NORCO) 10-325 MG tablet  sent to [facility] #[id number]

## 2023-12-05 NOTE — Telephone Encounter (Signed)
Last seen on 07/17/23 Follow up scheduled 01/17/24 Last filled on 11/06/23#90 tablets (30 day supply) Rx pending to be signed

## 2024-01-07 ENCOUNTER — Other Ambulatory Visit: Payer: Self-pay | Admitting: Neurology

## 2024-01-07 MED ORDER — HYDROCODONE-ACETAMINOPHEN 10-325 MG PO TABS: ORAL_TABLET | ORAL | 0 refills | Status: DC

## 2024-01-07 NOTE — Telephone Encounter (Signed)
 Pt request refill for HYDROcodone-acetaminophen (NORCO) 10-325 MG tablet send to [facility] #[id number]

## 2024-01-07 NOTE — Telephone Encounter (Signed)
 Last seen 07/17/23 and next f/u 01/17/24. Last refilled 12/05/23 #90.

## 2024-01-16 NOTE — Patient Instructions (Signed)

## 2024-01-16 NOTE — Progress Notes (Unsigned)
No chief complaint on file.   HISTORY OF PRESENT ILLNESS:  01/16/24 ALL:  Angela Solomon is a 48 y.o. female here today for follow up for RRMS. She remains off DMT. Previously on Avonex. MRI 11/2022 stable with no new lesions.   She    Pain is "over the roof." She hurts all the time. She reports that from her lower back to her knees, she feels like she is a rubber band. She has pain of right foot from previous infection. Hydrocodone 10/325mg  ???, gabapentin 800mg  TID, cyclobenzaprine 10mg  TID do not seem to help much at all.   Mood is ??? Duloxetine? She continues quetiapine 50mg  and amitriptyline 75mg  (headaches) QHS. She doesn't go to bed until 6-7am and sleeps until 1-2pm. She reports sleep cycle has always been off due to working third shift. Her husband keeps the TV on all night.   She has 3-4 migraine days a week. She is taking Goody Powders multiple times a week.   She is having a hard time with short term memory loss. She reports that her family feels that she should be super woman. She endorses more stress.  HISTORY (copied from Dr Bonnita Hollow previous note)  Angela Solomon is a 48 y.o. woman with relapsing remitting multiple sclerosis and back pain.    Update 06/27/2023 She is not on a DMT now.    She stopped Avonex last year due to feeling down and other medical issues.    No recent exacerbation.   MRI 12/23 unchanged compared to12/ 2021.     She feels gait is stable with continued reduced balance..   No falls.  She can walk about 100-200 feet without a break.   She denies lim weakness.notes sme dysesthesias in limbs and a lot of back pain   She reports her memory is much worse.   Also she is not sleeping well every night.   Fatigue is worse.     She is on amitriptyline, gabapentin and cyclobenzaprine for her spine pain/dysesthesias.  She notes a dry mouth and we discussed that the amitriptyline is most likely to be causing it.    She takes hydrocodone 2 pills a day.      She has  noted more issues with bladder function.   She has a tight rubber band sensation in her upper thighs and abdomen/groin.     She is noting more pain and tightness in her back  and hips.   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  Dr. Maisie Fus has recommended ESI.       She takes cyclobenzaprine 3 times a day.  She is also on gabapentin and amitriptyline.       She tries to walk for exercise ut pain is worse when she does so.        She sometimes falls asleep during the day.   She feels more sleepy today due to poor sleep last night.    We discussed she is on several medications that could contribute.     She takes Seroquel at bedtime.    It helps he fall asleep.      She has IDDM.   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   She saw wound care last year for a right heel sore.      She is reporting more lower back pain worse when she has a more active day.   Pian is on the left side more than right.  She takes hydrocodone 10 mg bid.   She takes 800 mg gabapentin po tid and cyclobenzaprine 10 mg po tid .  She feels Flexeril helped more than baclofen.   She takes amitriptyline at bedtime and is also on Cymbalta.     She snores.  Some days she feels sleepy and other days feels fine.   No witnessed OSA signs.      EPWORTH SLEEPINESS SCALE   On a scale of 0 - 3 what is the chance of dozing:   Sitting and Reading:                           2 Watching TV:                                      2 Sitting inactive in a public place:        1 Passenger in car for one hour:           1 Lying down to rest in the afternoon:   1 Sitting and talking to someone:          0 Sitting quietly after lunch:                   1 In a car, stopped in traffic:                  0   Total (out of 24):   8/24   not excessively sleepy     MS History:   She presented in 2003 with gait ataxia, slurred speech and right sided paresthesias. Imaging studies were performed and she was diagnosed with multiple  sclerosis. Initially, she was placed on Rebif but had skin reactions and switched to Copaxone. On Copaxone, she had an immediate hypersensitivity response and it was discontinued. She has been on Avonex most of the time but has been off for months at a time when she has had insurance difficulties.       Review of MRI's: MRI brain 12/16/2022 showed Multiple T2/FLAIR hyperintense foci in the periventricular, juxtacortical and deep white matter of both hemispheres. None of the foci enhance or appear to be acute. Compared to the MRI from 12/13/2020, there are no new lesions. Normal ehancement pattern.   MRI lumbar spine 12/16/2022 showed At L1-L2, there are degenerative changes causing mild to moderate spinal stenosis but no nerve root compression.    At L2-L3, there are degenerative changes and possibly congenitally short pedicles causing moderate spinal stenosis but no nerve root compression.    At L3-L4, there is severe spinal stenosis and moderately severe left greater than right lateral recess stenosis with potential for left L4 and possibly right L4 nerve root compression.    At L4-L5, level is postoperative with a Coflex device in place.  There is borderline spinal stenosis, moderate bilateral foraminal narrowing and severe left lateral recess stenosis and moderately severe right lateral recess stenosis.  There is potential for L5 nerve root compression on either side.  There is epidural fibrosis to the left.  The epidural fibrosis is stable compared to the 07/21/2015 MRI though degenerative changes have progressed.    The degenerative changes at L1-L2, L2-L3, L3-L4 and L4-L5, progressed compared to the 2016 MRI.   MRI Brain (report) 12/13/2020 showed Motion degraded study.   Compared to 2015  Scattered foci  of T2 hyperintensity within the white matter of  the cerebral hemispheres, nonspecific but consistent with the clinical diagnosis of multiple sclerosis. Bilateral frontoparietal and callosal lesions  appear enlarged when compared to prior MRI, particularly the periatrial lesions. No enhancing lesion identified.    MRI Brain 04/24/2016 showed T2/FLAIR hyperintense foci, predominantly in the periventricular white matter in a pattern and configuration consistent with chronic demyelinating plaque associated with multiple sclerosis. None of the foci enhances after gadolinium administration.  There are no acute findings.   There is no change when compared to the MRI dated 09/28/2013.   MRI cervical spine 04/24/2016 showed Multiple foci within the cervical spinal cord consistent with chronic demyelinating plaque associated with multiple sclerosis. None of the foci enhances after contrast administration. There is no change when compared to the MRI dated 05/20/2014.  Mild degenerative changes at C4-C5, C5-C6 and C7-T1 that did not lead to nerve root impingement.   MRI of the cervical spine dated 05/20/2014. It showed enhancing spinal cord lesions consistent with MS. C2-C3 there was a left lateral focus. Also at C2-C3 there was a posterior focus. At C3-C4 there was a right posterior lateral focus. At C4 there was a dorsal focus and at C5 there was another dorsal focus. 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 There are multilevel degenerative changes most pronounced at C7-T1 where there could be dynamic impingement of the right C8 nerve root.    MRI of the brain dated 09/28/2013 showing diffuse white matter foci in the periventricular and deep white matter consistent with [place].  Although there were no enhancing foci, when compared to a previous MRI dated 10/11/2010, there was some progression.    MRI of the lumbar spine dated 04/27/2015 showed a large disc extrusion with sequestered fragment causing bilateral L5 nerve root compression.     MRI of the brain with and without contrast dated 10/16/2008 showed enhancing lesions.  She was reportedly off medication at that time.   REVIEW OF SYSTEMS: Out of a  complete 14 system review of symptoms, the patient complains only of the following symptoms, see HPI and all other reviewed systems are negative.   ALLERGIES: Allergies  Allergen Reactions   Atorvastatin Other (See Comments)    Myalgia Myalgia   Glatiramer Anaphylaxis    Generic Copaxone   Lisinopril Anaphylaxis   Niacin Diarrhea   Valproic Acid Anaphylaxis   Buprenorphine Hcl Hives   Morphine And Codeine Hives   Aspirin Nausea And Vomiting    Fevers and GI upset   Hydrocodone-Acetaminophen Nausea And Vomiting    Pt states can take Hydrocodone but has a reaction to a additive in vicodin   Zithromax [Azithromycin] Rash     HOME MEDICATIONS: Outpatient Medications Prior to Visit  Medication Sig Dispense Refill   albuterol (PROVENTIL HFA;VENTOLIN HFA) 108 (90 BASE) MCG/ACT inhaler Inhale 1 puff into the lungs as needed for wheezing or shortness of breath. Reported on 04/06/2016     amitriptyline (ELAVIL) 75 MG tablet TAKE 1 TABLET BY MOUTH AT BEDTIME 30 tablet 5   amLODipine (NORVASC) 5 MG tablet Take 5 mg by mouth.     B-D INS SYR ULTRAFINE 1CC/31G 31G X 5/16" 1 ML MISC See admin instructions.  0   carvedilol (COREG) 25 MG tablet Take 25 mg by mouth 2 (two) times daily.      cyclobenzaprine (FLEXERIL) 10 MG tablet TAKE 1 TABLET BY MOUTH THREE TIMES DAILY AS NEEDED FOR MUSCLE SPASMS 90 tablet  10   DULoxetine (CYMBALTA) 60 MG capsule Take 60 mg by mouth daily.     empagliflozin (JARDIANCE) 10 MG TABS tablet Take 25 mg by mouth daily.      ferrous sulfate 325 (65 FE) MG tablet Take 325 mg by mouth daily.      fluconazole (DIFLUCAN) 150 MG tablet as needed.   0   fluticasone (FLONASE) 50 MCG/ACT nasal spray Place 2 sprays into both nostrils as needed for allergies.      gabapentin (NEURONTIN) 800 MG tablet TAKE 1 TABLET BY MOUTH 3 TIMES DAILY 90 tablet 10   HYDROcodone-acetaminophen (NORCO) 10-325 MG tablet Take 1 tablet by mouth three times daily as needed. 90 tablet 0   insulin  glargine (LANTUS) 100 UNIT/ML injection Inject 50 Units into the skin 2 (two) times daily.      interferon beta-1a (AVONEX) 30 MCG injection Inject 30 mcg into the muscle every 7 (seven) days. 12 each 3   losartan-hydrochlorothiazide (HYZAAR) 100-25 MG per tablet Take 1 tablet by mouth daily.      pantoprazole (PROTONIX) 40 MG tablet Take 40 mg by mouth at bedtime.      promethazine (PHENERGAN) 25 MG tablet Take 1 tablet (25 mg total) by mouth every 6 (six) hours as needed for nausea or vomiting. 30 tablet 1   QUEtiapine (SEROQUEL) 50 MG tablet TAKE 1 TABLET BY MOUTH AT BEDTIME 30 tablet 5   rosuvastatin (CRESTOR) 20 MG tablet      No facility-administered medications prior to visit.     PAST MEDICAL HISTORY: Past Medical History:  Diagnosis Date   Anemia    Depression    Diabetes mellitus without complication (HCC)    dx at age 34  type 1   GERD (gastroesophageal reflux disease)    Headache    Hypertension    Multiple sclerosis (HCC)    Neuromuscular disorder (HCC)    dx 2003 with MS   Neuropathy    PONV (postoperative nausea and vomiting)    sometimes..........Marland Kitchen   Vision abnormalities      PAST SURGICAL HISTORY: Past Surgical History:  Procedure Laterality Date   ABDOMINAL HYSTERECTOMY     dec 2021   CHOLECYSTECTOMY     DENTAL SURGERY     LUMBAR LAMINECTOMY WITH COFLEX 1 LEVEL Bilateral 06/29/2015   Procedure: LUMBAR LAMINECTOMY WITH COFLEX 1 LEVEL;  Surgeon: Aliene Beams, MD;  Location: [facility];  Service: Neurosurgery;  Laterality: Bilateral;  LUMBAR LAMINECTOMY WITH COFLEX 1 LEVEL   right foot surgery     remove a piece of glass  (had c/o for about 1 yr)   TONSILLECTOMY     TUBAL LIGATION     WOUND EXPLORATION N/A 07/22/2015   Procedure: WOUND EXPLORATION;  Surgeon: Aliene Beams, MD;  Location: [facility];  Service: Neurosurgery;  Laterality: N/A;     FAMILY HISTORY: Family History  Problem Relation Age of Onset   Hypertension Mother    Diabetes  Mother    Stroke Mother    Heart attack Mother    Peripheral vascular disease Mother      SOCIAL HISTORY: Social History   Socioeconomic History   Marital status: Married    Spouse name: Not on file   Number of children: Not on file   Years of education: Not on file   Highest education level: Not on file  Occupational History   Not on file  Tobacco Use   Smoking status: Every Day  Current packs/day: 0.50    Average packs/day: 0.5 packs/day for 24.0 years (12.0 ttl pk-yrs)    Types: Cigarettes   Smokeless tobacco: Never  Substance and Sexual Activity   Alcohol use: Not Currently    Alcohol/week: 0.0 standard drinks of alcohol    Comment: occasional   Drug use: No   Sexual activity: Not on file  Other Topics Concern   Not on file  Social History Narrative   Not on file   Social Drivers of Health   Financial Resource Strain: Not on file  Food Insecurity: Not on file  Transportation Needs: Not on file  Physical Activity: Not on file  Stress: Not on file  Social Connections: Unknown (03/07/2023)   [facility]   Social Network    Social Network: Not on file  Intimate Partner Violence: Unknown (03/07/2023)   Received from [facility], [facility]   HITS    Physically Hurt: Not on file    Insult or Talk Down To: Not on file    Threaten Physical Harm: Not on file    Scream or Curse: Not on file     PHYSICAL EXAM  There were no vitals filed for this visit.  There is no height or weight on file to calculate BMI.  Generalized: Well developed, in no acute distress  Cardiology: normal rate and rhythm, no murmur auscultated  Respiratory: clear to auscultation bilaterally    Neurological examination  Mentation: Alert oriented to time, place, history taking. Follows all commands speech and language fluent Cranial nerve II-XII: Pupils were equal round reactive to light. Extraocular movements were full, visual field were full on  confrontational test. Facial sensation and strength were normal. Head turning and shoulder shrug  were normal and symmetric. Motor: The motor testing reveals 5 over 5 strength of all 4 extremities. Good symmetric motor tone is noted throughout.  Sensory: Sensory testing is intact to soft touch on all 4 extremities. No evidence of extinction is noted.  Coordination: Cerebellar testing reveals good finger-nose-finger and heel-to-shin bilaterally.  Gait and station: Gait is normal.  Reflexes: Deep tendon reflexes are symmetric and normal bilaterally.    DIAGNOSTIC DATA (LABS, IMAGING, TESTING) - I reviewed patient records, labs, notes, testing and imaging myself where available.  Lab Results  Component Value Date   WBC 6.7 07/21/2015   HGB 13.0 07/21/2015   HCT 37.0 07/21/2015   MCV 85.6 07/21/2015   PLT 377 07/21/2015      Component Value Date/Time   NA 129 (L) 07/21/2015 1307   K 3.5 07/21/2015 1307   CL 89 (L) 07/21/2015 1307   CO2 27 07/21/2015 1307   GLUCOSE 457 (H) 07/21/2015 1307   BUN 9 07/21/2015 1307   CREATININE 0.69 07/21/2015 1307   CALCIUM 9.1 07/21/2015 1307   PROT 7.3 06/22/2015 1519   ALBUMIN 3.7 06/22/2015 1519   AST 13 (L) 06/22/2015 1519   ALT 15 06/22/2015 1519   ALKPHOS 76 06/22/2015 1519   BILITOT 0.4 06/22/2015 1519   GFRNONAA >60 07/21/2015 1307   GFRAA >60 07/21/2015 1307   No results found for: "CHOL", "HDL", "LDLCALC", "LDLDIRECT", "TRIG", "CHOLHDL" Lab Results  Component Value Date   HGBA1C 12.3 (H) 07/21/2015   No results found for: "VITAMINB12" No results found for: "TSH"      No data to display               No data to display  ASSESSMENT AND PLAN  48 y.o. year old female  has a past medical history of Anemia, Depression, Diabetes mellitus without complication (HCC), GERD (gastroesophageal reflux disease), Headache, Hypertension, Multiple sclerosis (HCC), Neuromuscular disorder (HCC), Neuropathy, PONV (postoperative  nausea and vomiting), and Vision abnormalities. here with    No diagnosis found.  Angela Solomon reports that she has not felt well, recently. She has had more trouble with balance since amputation of right great toe about a year ago. She has worsening chronic pain, headaches and short term memory loss. I am concerned that depression and grief could be contributing. I will order an MRI due to reports of paresthesias 4 months ago. We will consider DMT if needed pending review. We discussed possible pain management referral for worsening pain, however, she declines. Could consider PT if she wishes. I have encouraged her to work on sleep hygiene. She was encouraged to use a cane or walker at all times. Fall precautions reviewed. Healthy lifestyle habits encouraged. She will follow up with care team as directed. She will return to see Dr Epimenio Foot in 6 months, sooner if needed. She verbalizes understanding and agreement with this plan.   No orders of the defined types were placed in this encounter.    No orders of the defined types were placed in this encounter.    Shawnie Dapper, MSN, FNP-C 01/16/2024, 1:33 PM  [facility] [address], [address] [place], [place] [address] [phone]

## 2024-01-17 ENCOUNTER — Encounter: Payer: Self-pay | Admitting: Family Medicine

## 2024-01-17 ENCOUNTER — Telehealth: Payer: Self-pay | Admitting: Family Medicine

## 2024-01-17 ENCOUNTER — Ambulatory Visit (INDEPENDENT_AMBULATORY_CARE_PROVIDER_SITE_OTHER): Payer: 59 | Admitting: Family Medicine

## 2024-01-17 VITALS — BP 135/82 | HR 93 | Ht 73.0 in | Wt 262.0 lb

## 2024-01-17 DIAGNOSIS — M48061 Spinal stenosis, lumbar region without neurogenic claudication: Secondary | ICD-10-CM

## 2024-01-17 DIAGNOSIS — G35 Multiple sclerosis: Secondary | ICD-10-CM | POA: Diagnosis not present

## 2024-01-17 DIAGNOSIS — M545 Low back pain, unspecified: Secondary | ICD-10-CM | POA: Diagnosis not present

## 2024-01-17 DIAGNOSIS — F339 Major depressive disorder, recurrent, unspecified: Secondary | ICD-10-CM

## 2024-01-17 DIAGNOSIS — G8929 Other chronic pain: Secondary | ICD-10-CM

## 2024-01-17 DIAGNOSIS — R269 Unspecified abnormalities of gait and mobility: Secondary | ICD-10-CM

## 2024-01-17 DIAGNOSIS — G43809 Other migraine, not intractable, without status migrainosus: Secondary | ICD-10-CM

## 2024-01-17 DIAGNOSIS — F4321 Adjustment disorder with depressed mood: Secondary | ICD-10-CM

## 2024-01-17 DIAGNOSIS — G4700 Insomnia, unspecified: Secondary | ICD-10-CM

## 2024-01-17 MED ORDER — QUETIAPINE FUMARATE 50 MG PO TABS: 50.0000 mg | ORAL_TABLET | Freq: Every day | ORAL | 5 refills | Status: DC

## 2024-01-17 NOTE — Telephone Encounter (Signed)
Referral for psychiatry fax to [facility]. Phone: [phone], Fax: [phone]

## 2024-01-17 NOTE — Telephone Encounter (Signed)
Referral for psychology fax to [facility]. Phone: [phone], Fax: [phone]

## 2024-02-04 ENCOUNTER — Encounter: Payer: Self-pay | Admitting: Family Medicine

## 2024-02-04 ENCOUNTER — Encounter: Payer: Self-pay | Admitting: Neurology

## 2024-02-04 ENCOUNTER — Other Ambulatory Visit: Payer: Self-pay | Admitting: Neurology

## 2024-02-05 ENCOUNTER — Other Ambulatory Visit: Payer: Self-pay | Admitting: Family Medicine

## 2024-02-05 ENCOUNTER — Other Ambulatory Visit: Payer: Self-pay

## 2024-02-05 MED ORDER — HYDROCODONE-ACETAMINOPHEN 10-325 MG PO TABS: ORAL_TABLET | ORAL | 0 refills | Status: DC

## 2024-02-05 NOTE — Telephone Encounter (Signed)
Pt notified [facility] do not take Medicaid.   Refax referral for psychology fax to [facility]. Phone:  [phone], Fax: [phone]

## 2024-02-05 NOTE — Telephone Encounter (Signed)
Pt is requesting a refill for HYDROcodone-acetaminophen (NORCO) 10-325 MG tablet .  Pharmacy: [facility] [phone]

## 2024-02-05 NOTE — Telephone Encounter (Signed)
Last seen 01/17/24 and has no f/u scheduled currently. I called pt and scheduled appt for 07/21/24 at 1:00pm with Dr Epimenio Foot.   Last refilled 01/07/24 #90. Sent to MD to e-scribe

## 2024-02-05 NOTE — Telephone Encounter (Signed)
Referral Sent to [facility]

## 2024-02-05 NOTE — Telephone Encounter (Signed)
Pt notified [facility] do not take Medicaid.  Refax referral for psychiatry fax to [facility] Phone:  [phone], Fax: [phone]

## 2024-03-03 ENCOUNTER — Other Ambulatory Visit: Payer: Self-pay | Admitting: Family Medicine

## 2024-03-03 MED ORDER — HYDROCODONE-ACETAMINOPHEN 10-325 MG PO TABS: ORAL_TABLET | ORAL | 0 refills | Status: DC

## 2024-03-03 NOTE — Telephone Encounter (Signed)
 Pt is requesting a refill for HYDROcodone-acetaminophen (NORCO) 10-325 MG tablet .  Pharmacy: [facility] [phone]

## 2024-03-04 ENCOUNTER — Other Ambulatory Visit: Payer: Self-pay | Admitting: Neurology

## 2024-04-07 ENCOUNTER — Other Ambulatory Visit: Payer: Self-pay | Admitting: Family Medicine

## 2024-04-07 MED ORDER — HYDROCODONE-ACETAMINOPHEN 10-325 MG PO TABS: ORAL_TABLET | ORAL | 0 refills | Status: DC

## 2024-04-07 NOTE — Telephone Encounter (Signed)
 Last seen 01/17/24 and next f/u 07/21/24. Last refilled 03/07/24 #90.

## 2024-04-07 NOTE — Telephone Encounter (Signed)
 Pt request refill for HYDROcodone-acetaminophen (NORCO) 10-325 MG tablet send to [facility] #[id number]

## 2024-05-05 ENCOUNTER — Other Ambulatory Visit: Payer: Self-pay | Admitting: Family Medicine

## 2024-05-05 MED ORDER — HYDROCODONE-ACETAMINOPHEN 10-325 MG PO TABS: ORAL_TABLET | ORAL | 0 refills | Status: DC

## 2024-05-05 NOTE — Telephone Encounter (Signed)
 Pt is requesting a refill for HYDROcodone-acetaminophen (NORCO) 10-325 MG tablet .  Pharmacy: [facility] [phone]

## 2024-05-05 NOTE — Telephone Encounter (Signed)
 Last seen 01/17/24 and next f/u 07/21/24. Last refilled 04/07/24 #90.

## 2024-06-04 ENCOUNTER — Other Ambulatory Visit: Payer: Self-pay | Admitting: Family Medicine

## 2024-06-04 MED ORDER — HYDROCODONE-ACETAMINOPHEN 10-325 MG PO TABS: ORAL_TABLET | ORAL | 0 refills | Status: DC

## 2024-06-04 NOTE — Telephone Encounter (Signed)
 Last seen on 01/17/24 Follow up scheduled on 07/21/24   Dispensed Days Supply Quantity Provider Pharmacy  HYDROCODONE /ACETAMINOPHEN  10-325 T 05/07/2024 30 90 each Sater, Sherida Dimmer, MD [facility] #.      Rx pending to be signed

## 2024-06-04 NOTE — Telephone Encounter (Signed)
 Pt called to request Medication refill  HYDROcodone -acetaminophen  (NORCO) 10-325 MG tablet   Pt would like medication to be sent to   [facility] #[id number] - [place], [place] - [address] (Ph: [phone])

## 2024-07-03 ENCOUNTER — Other Ambulatory Visit: Payer: Self-pay | Admitting: Family Medicine

## 2024-07-03 MED ORDER — HYDROCODONE-ACETAMINOPHEN 10-325 MG PO TABS: ORAL_TABLET | ORAL | 0 refills | Status: DC

## 2024-07-03 NOTE — Telephone Encounter (Signed)
 Pt is requesting a refill for HYDROcodone-acetaminophen (NORCO) 10-325 MG tablet .  Pharmacy: [facility] [phone]

## 2024-07-03 NOTE — Telephone Encounter (Signed)
 Last seen on 01/17/24 Follow up scheduled on 07/21/24   Dispensed Days Supply Quantity Provider Pharmacy  HYDROCODONE /ACETAMINOPHEN  10-325 T 06/06/2024 30 90 each Sater, Charlie LABOR, MD [facility] #...   Rx pending to be signed

## 2024-07-21 ENCOUNTER — Encounter: Payer: Self-pay | Admitting: Neurology

## 2024-07-21 ENCOUNTER — Telehealth: Payer: Self-pay | Admitting: Family Medicine

## 2024-07-21 ENCOUNTER — Ambulatory Visit: Payer: 59 | Admitting: Neurology

## 2024-07-21 NOTE — Telephone Encounter (Signed)
 Pt has called and r/s an appointment

## 2024-08-04 ENCOUNTER — Other Ambulatory Visit: Payer: Self-pay | Admitting: Family Medicine

## 2024-08-04 MED ORDER — HYDROCODONE-ACETAMINOPHEN 10-325 MG PO TABS: ORAL_TABLET | ORAL | 0 refills | Status: DC

## 2024-08-04 NOTE — Telephone Encounter (Signed)
 Patient request refill for HYDROcodone -acetaminophen  (NORCO) 10-325 MG tablet send to [facility] #[id number]

## 2024-08-04 NOTE — Telephone Encounter (Signed)
 Pt was last seem 01/17/2024 Upcoming Appointment 02/23/2025  Hydrocodone  Acetaminophen  10-325mg  last filled 07/05/2024

## 2024-08-25 ENCOUNTER — Other Ambulatory Visit: Payer: Self-pay | Admitting: Neurology

## 2024-08-26 NOTE — Telephone Encounter (Signed)
 Last seen on 01/17/24 Follow up scheduled on 02/23/25

## 2024-09-08 ENCOUNTER — Other Ambulatory Visit: Payer: Self-pay | Admitting: Neurology

## 2024-09-08 MED ORDER — HYDROCODONE-ACETAMINOPHEN 10-325 MG PO TABS: ORAL_TABLET | ORAL | 0 refills | Status: DC

## 2024-09-08 NOTE — Telephone Encounter (Signed)
 Called pt and r/s sooner. Please send refill to [facility] in [place] [address]

## 2024-09-08 NOTE — Telephone Encounter (Signed)
 Last seen 01/17/24 and next f/u 02/23/25.   Last refilled 08/05/24 #90.  Phone room: appt too far out. Please call and r/s sooner appt first. You can offer 09/23/24 at 11:30a with Dr. Vear. Route back once pt r/s to handle refill request

## 2024-09-08 NOTE — Telephone Encounter (Signed)
 PT called to request medication refill  HYDROcodone -acetaminophen  (NORCO) 10-325 MG tablet   Pt would like medication to be sent to    [facility] #[id number] - [place], [place] - [address] (Ph: [phone])

## 2024-09-23 ENCOUNTER — Encounter: Payer: Self-pay | Admitting: Neurology

## 2024-09-23 ENCOUNTER — Ambulatory Visit: Admitting: Neurology

## 2024-10-06 ENCOUNTER — Other Ambulatory Visit: Payer: Self-pay | Admitting: Family Medicine

## 2024-10-06 MED ORDER — HYDROCODONE-ACETAMINOPHEN 10-325 MG PO TABS: ORAL_TABLET | ORAL | 0 refills | Status: DC

## 2024-10-06 NOTE — Telephone Encounter (Signed)
 Pt is requesting a refill for HYDROcodone-acetaminophen (NORCO) 10-325 MG tablet .  Pharmacy: [facility] [phone]

## 2024-10-06 NOTE — Telephone Encounter (Signed)
 Last seen on 01/17/24 Follow up scheduled on 10/29/24   Dispensed Days Supply Quantity Provider Pharmacy  HYDROCODONE /[email or web address]  10-325 T 09/08/2024 30 90 each Sater, Charlie LABOR, MD [facility] #...     Rx pending to be signed

## 2024-10-29 ENCOUNTER — Telehealth: Payer: Self-pay | Admitting: Neurology

## 2024-10-29 ENCOUNTER — Ambulatory Visit: Admitting: Neurology

## 2024-10-29 ENCOUNTER — Encounter: Payer: Self-pay | Admitting: Neurology

## 2024-10-29 VITALS — BP 121/79 | Ht 72.0 in | Wt 260.0 lb

## 2024-10-29 DIAGNOSIS — G35C1 Active secondary progressive multiple sclerosis: Secondary | ICD-10-CM | POA: Diagnosis not present

## 2024-10-29 DIAGNOSIS — G4700 Insomnia, unspecified: Secondary | ICD-10-CM

## 2024-10-29 DIAGNOSIS — G8929 Other chronic pain: Secondary | ICD-10-CM

## 2024-10-29 DIAGNOSIS — R269 Unspecified abnormalities of gait and mobility: Secondary | ICD-10-CM | POA: Diagnosis not present

## 2024-10-29 DIAGNOSIS — M545 Low back pain, unspecified: Secondary | ICD-10-CM

## 2024-10-29 DIAGNOSIS — F339 Major depressive disorder, recurrent, unspecified: Secondary | ICD-10-CM

## 2024-10-29 NOTE — Progress Notes (Signed)
 [facility]  PATIENT: Angela Solomon DOB: 1976/07/27  REFERRING CLINICIAN: Dr. Charlie Porch  HISTORY FROM: patient REASON FOR VISIT: MS   HISTORICAL  CHIEF COMPLAINT:  Chief Complaint  Patient presents with   RM10/MS    Pt is here Alone. Pt denies any new symptoms. Pt states that her back has been bothering her.     HISTORY OF PRESENT ILLNESS:   Angela Solomon is a 48 y.o. woman with relapsing remitting multiple sclerosis and back pain.   Update 10/29/2024 She stopped Avonex  in 2022 due to feeling down and having multiple medical issues having multiple medical issues besides MS.    No recent exacerbation.   MRI 12/23 unchanged compared to12/ 2021.    Her gait is stable though her balance is reduced.  She has no falls.Angela Solomon are difficult. She can walk about 100-200 feet without a break.   She denies change in strength but notes a tightness in her leg muscles.    She has dysesthesias in limbs and a lot of back pain.  She has noted more issues with bladder function.   She has a tight rubber band sensation in her upper thighs and abdomen/groin.    She notes difficulties with short-term memory and focus/attention.  She does not sleep well at night.  She notes fatigue on a daily basis.     She is on amitriptyline , gabapentin  and cyclobenzaprine  for her spine pain/dysesthesias.  She notes a dry mouth and we discussed that the amitriptyline  is most likely to be causing it.    She takes hydrocodone  2 pills a day.     She had lumbar surgery in 2016 but continued to have LBP and sciatica type ain down legs.     She takes cyclobenzaprine  3 times a day,  gabapentin  tid or qid and amitriptyline  qHS.      She sometimes falls asleep during the day.   She feels more sleepy today due to poor sleep last night.    We discussed she is on several medications that could contribute.    She takes Seroquel  and amitriptyline  at bedtime.    These help he fall asleep but causes dry mouth    She has IDDM. Last HgbA1c was 8.1.   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   No current ulcers  She snores.  Some days she feels sleepy and other days feels fine.   No witnessed OSA signs.     EPWORTH SLEEPINESS SCALE  On a scale of 0 - 3 what is the chance of dozing:  Sitting and Reading:   2 Watching TV:    2 Sitting inactive in a public place: 1 Passenger in car for one hour: 1 Lying down to rest in the afternoon: 1 Sitting and talking to someone: 0 Sitting quietly after lunch:  1 In a car, stopped in traffic:  0  Total (out of 24):   8/24   not excessively sleepy   MS History:   She presented in 2003 with gait ataxia, slurred speech and right sided paresthesias. Imaging studies were performed and she was diagnosed with multiple sclerosis. Initially, she was placed on Rebif  but had skin reactions and switched to Copaxone. On Copaxone, she had an immediate hypersensitivity response and it was discontinued. She has been on Avonex  most of the time but has been off for months at a time when she has had insurance difficulties.  Review of MRI's: MRI brain 12/16/2022 showed Multiple T2/FLAIR hyperintense foci in the periventricular, juxtacortical and deep white matter of both hemispheres. None of the foci enhance or appear to be acute. Compared to the MRI from 12/13/2020, there are no new lesions. Normal ehancement pattern.  MRI lumbar spine 12/23/2023showed At L1-L2, there are degenerative changes causing mild to moderate spinal stenosis but no nerve root compression.    At L2-L3, there are degenerative changes and possibly congenitally short pedicles causing moderate spinal stenosis but no nerve root compression.    At L3-L4, there is severe spinal stenosis and moderately severe left greater than right lateral recess stenosis with potential for left L4 and possibly right L4 nerve root compression.    At L4-L5, level is postoperative with a Coflex device in  place.  There is borderline spinal stenosis, moderate bilateral foraminal narrowing and severe left lateral recess stenosis and moderately severe right lateral recess stenosis.  There is potential for L5 nerve root compression on either side.  There is epidural fibrosis to the left.  The epidural fibrosis is stable compared to the 07/21/2015 MRI though degenerative changes have progressed.    The degenerative changes at L1-L2, L2-L3, L3-L4 and L4-L5, progressed compared to the 2016 MRI.  MRI Brain (report) 12/13/2020 showed Motion degraded study.   Compared to 2015  Scattered foci of T2 hyperintensity within the white matter of  the cerebral hemispheres, nonspecific but consistent with the clinical diagnosis of multiple sclerosis. Bilateral frontoparietal and callosal lesions appear enlarged when compared to prior MRI, particularly the periatrial lesions. No enhancing lesion identified.   MRI Brain 04/24/2016 showed T2/FLAIR hyperintense foci, predominantly in the periventricular white matter in a pattern and configuration consistent with chronic demyelinating plaque associated with multiple sclerosis. None of the foci enhances after gadolinium administration.  There are no acute findings.   There is no change when compared to the MRI dated 09/28/2013.  MRI cervical spine 04/24/2016 showed Multiple foci within the cervical spinal cord consistent with chronic demyelinating plaque associated with multiple sclerosis. None of the foci enhances after contrast administration. There is no change when compared to the MRI dated 05/20/2014.  Mild degenerative changes at C4-C5, C5-C6 and C7-T1 that did not lead to nerve root impingement.  MRI of the cervical spine dated 05/20/2014. It showed enhancing spinal cord lesions consistent with MS. C2-C3 there was a left lateral focus. Also at C2-C3 there was a posterior focus. At C3-C4 there was a right posterior lateral focus. At C4 there was a dorsal focus and at C5 there was  another dorsal focus. 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 There are multilevel degenerative changes most pronounced at C7-T1 where there could be dynamic impingement of the right C8 nerve root.   MRI of the brain dated 09/28/2013 showing diffuse white matter foci in the periventricular and deep white matter consistent with us .  Although there were no enhancing foci, when compared to a previous MRI dated 10/11/2010, there was some progression.   MRI of the lumbar spine dated 04/27/2015 showed a large disc extrusion with sequestered fragment causing bilateral L5 nerve root compression.    MRI of the brain with and without contrast dated 10/16/2008 showed enhancing lesions.  She was reportedly off medication at that time.   REVIEW OF SYSTEMS:  Constitutional: No fevers, chills, sweats, or change in appetite.   Fatigue, poor sleep Eyes: No visual changes, double vision, eye pain Ear, nose and throat: No hearing  loss, ear pain, nasal congestion, sore throat Cardiovascular: No chest pain, palpitations Respiratory:  No shortness of breath at rest or with exertion.   No wheezes GastrointestinaI: No nausea, vomiting, diarrhea, abdominal pain, fecal incontinence Genitourinary:  No dysuria, urinary retention or frequency.  No nocturia. Musculoskeletal:  as above. Integumentary: No rash, pruritus, skin lesions Neurological: as above Psychiatric: No depression at this time.  No anxiety Endocrine: No palpitations, diaphoresis, change in appetite, change in weigh or increased thirst Hematologic/Lymphatic:  No anemia, purpura, petechiae. Allergic/Immunologic: No itchy/runny eyes, nasal congestion, recent allergic reactions, rashes  ALLERGIES: Allergies  Allergen Reactions   Atorvastatin Other (See Comments)    Myalgia Myalgia   Glatiramer Anaphylaxis    Generic Copaxone   Lisinopril Anaphylaxis   Niacin Diarrhea   Valproic Acid Anaphylaxis   Buprenorphine Hcl Hives   Morphine  And  Codeine Hives   Aspirin Nausea And Vomiting    Fevers and GI upset   Hydrocodone -Acetaminophen  Nausea And Vomiting    Pt states can take Hydrocodone  but has a reaction to a additive in vicodin   Zithromax [Azithromycin] Rash    HOME MEDICATIONS: Outpatient Medications Prior to Visit  Medication Sig Dispense Refill   albuterol  (PROVENTIL  HFA;VENTOLIN  HFA) 108 (90 BASE) MCG/ACT inhaler Inhale 1 puff into the lungs as needed for wheezing or shortness of breath. Reported on 04/06/2016     amitriptyline  (ELAVIL ) 75 MG tablet TAKE 1 TABLET BY MOUTH EVERY NIGHT AT BEDTIME 30 tablet 5   amLODipine (NORVASC) 5 MG tablet Take 5 mg by mouth.     B-D INS SYR ULTRAFINE 1CC/31G 31G X 5/16 1 ML MISC See admin instructions.  0   carvedilol  (COREG ) 25 MG tablet Take 25 mg by mouth 2 (two) times daily.      cyclobenzaprine  (FLEXERIL ) 10 MG tablet TAKE 1 TABLET BY MOUTH THREE TIMES DAILY AS NEEDED FOR MUSCLE SPASMS 360 tablet 1   DULoxetine  (CYMBALTA ) 60 MG capsule Take 60 mg by mouth daily.     empagliflozin (JARDIANCE) 10 MG TABS tablet Take 25 mg by mouth daily.      ferrous sulfate 325 (65 FE) MG tablet Take 325 mg by mouth daily.      fluconazole (DIFLUCAN) 150 MG tablet as needed.   0   fluticasone  (FLONASE ) 50 MCG/ACT nasal spray Place 2 sprays into both nostrils as needed for allergies.      gabapentin  (NEURONTIN ) 800 MG tablet TAKE 1 TABLET BY MOUTH 3 TIMES DAILY 90 tablet 10   HYDROcodone -acetaminophen  (NORCO) 10-325 MG tablet Take 1 tablet by mouth three times daily as needed. 90 tablet 0   insulin  glargine (LANTUS) 100 UNIT/ML injection Inject 50 Units into the skin 2 (two) times daily.      losartan -hydrochlorothiazide  (HYZAAR) 100-25 MG per tablet Take 1 tablet by mouth daily.      pantoprazole  (PROTONIX ) 40 MG tablet Take 40 mg by mouth at bedtime.      promethazine  (PHENERGAN ) 25 MG tablet Take 1 tablet (25 mg total) by mouth every 6 (six) hours as needed for nausea or vomiting. 30 tablet 1    QUEtiapine  (SEROQUEL ) 50 MG tablet Take 1 tablet (50 mg total) by mouth at bedtime. 30 tablet 5   rosuvastatin (CRESTOR) 20 MG tablet      interferon beta-1a  (AVONEX ) 30 MCG injection Inject 30 mcg into the muscle every 7 (seven) days. 12 each 3   No facility-administered medications prior to visit.    PAST MEDICAL HISTORY: Past Medical  History:  Diagnosis Date   Anemia    Depression    Diabetes mellitus without complication (HCC)    dx at age 16  type 1   GERD (gastroesophageal reflux disease)    Headache    Hypertension    Multiple sclerosis    Neuromuscular disorder (HCC)    dx 2003 with MS   Neuropathy    PONV (postoperative nausea and vomiting)    sometimes..........Angela   Vision abnormalities     PAST SURGICAL HISTORY: Past Surgical History:  Procedure Laterality Date   ABDOMINAL HYSTERECTOMY     dec 2021   CHOLECYSTECTOMY     DENTAL SURGERY     LUMBAR LAMINECTOMY WITH COFLEX 1 LEVEL Bilateral 06/29/2015   Procedure: LUMBAR LAMINECTOMY WITH COFLEX 1 LEVEL;  Surgeon: Darina Boehringer, MD;  Location: [facility];  Service: Neurosurgery;  Laterality: Bilateral;  LUMBAR LAMINECTOMY WITH COFLEX 1 LEVEL   right foot surgery     remove a piece of glass  (had c/o for about 1 yr)   TONSILLECTOMY     TUBAL LIGATION     WOUND EXPLORATION N/A 07/22/2015   Procedure: WOUND EXPLORATION;  Surgeon: Darina Boehringer, MD;  Location: [facility];  Service: Neurosurgery;  Laterality: N/A;    FAMILY HISTORY: Family History  Problem Relation Age of Onset   Hypertension Mother    Diabetes Mother    Stroke Mother    Heart attack Mother    Peripheral vascular disease Mother     SOCIAL HISTORY:  Social History   Socioeconomic History   Marital status: Married    Spouse name: Not on file   Number of children: Not on file   Years of education: Not on file   Highest education level: Not on file  Occupational History   Not on file  Tobacco Use   Smoking status: Every Day     Current packs/day: 0.50    Average packs/day: 0.5 packs/day for 24.0 years (12.0 ttl pk-yrs)    Types: Cigarettes   Smokeless tobacco: Never  Substance and Sexual Activity   Alcohol use: Not Currently    Alcohol/week: 0.0 standard drinks of alcohol    Comment: occasional   Drug use: No   Sexual activity: Not on file  Other Topics Concern   Not on file  Social History Narrative   Not on file   Social Drivers of Health   Financial Resource Strain: Not on file  Food Insecurity: Low Risk  (02/14/2024)   Received from [facility]   Hunger Vital Sign    Within the past 12 months, you worried that your food would run out before you got money to buy more: Never true    Within the past 12 months, the food you bought just didn't last and you didn't have money to get more. : Never true  Transportation Needs: No Transportation Needs (02/14/2024)   Received from [facility]    In the past 12 months, has lack of reliable transportation kept you from medical appointments, meetings, work or from getting things needed for daily living? : No  Physical Activity: Not on file  Stress: Not on file  Social Connections: Unknown (03/07/2023)   Received from [facility]   Social Network    Social Network: Not on file  Intimate Partner Violence: Unknown (03/07/2023)   Received from [facility]   HITS    Physically Hurt: Not on file    Insult or Talk  Down To: Not on file    Threaten Physical Harm: Not on file    Scream or Curse: Not on file     PHYSICAL EXAM  Vitals:   10/29/24 1616  BP: 121/79  Weight: 260 lb (117.9 kg)  Height: 6' (1.829 m)    Body mass index is 35.26 kg/m.   General: The patient is well-developed and well-nourished and in no acute distress   Lower Back: She has some tenderness in the lower lumbar paraspinal muscles.  Neurologic Exam  Mental status: She is alert and oriented. The affect is normal.. She has no difficulty with speech. Focus is  mildly reduced short-term memory seems appropriate.  Cranial nerves: Extraocular movements are full.  Facial strength and sensation is normal.  Trapezius strength is normal.  . Palatal elevation of protrusion is midline. Hearing is normal and symmetric.    Motor:  Muscle bulk and tone are normal. Strength was normal in the right arm and she had slight weakness 4+/5 in the ulnar innervated muscles of the left. Strength is 4+/5 in the toes. The left great toe has been surgically removed   Sensory: She reports reduced sensation to vibration iand touch at ankles, very reduced at toes  Coordination: Cerebellar testing reveals good finger-nose-finger but poor heel-to-shin bilaterally.  Gait and station: Station is wide.  Her gait is wide.  Tone is poor.  She cannot do a tandem walk.  Reflexes: Deep tendon reflexes are symmetric in her arms and legs (trace ankle)..   _________________________________  Active secondary progressive multiple sclerosis - Plan: MR BRAIN WO CONTRAST  Chronic low back pain, unspecified back pain laterality, unspecified whether sciatica present  Gait disturbance  Insomnia, unspecified type  Episode of recurrent major depressive disorder, unspecified depression episode severity   1.   Her MS has been stable and she will remain off of a disease modifying therapy.  We would need to check an MRI of the brain to determine if there is any breakthrough activity.  If this is occurring I would recommend restarting a DMT, perhaps Mavenclad to improve compliance  2.   She has L3-L4 greater than L2-L3 spinal stenosis.  We discussed referring her to surgery if symptoms worsen.  She will continue hydrocodone  3 times a day and flexeril .    3.   Stay active and exercise as tolerated. 4.    rtc 6 months, sooner if problems  This visit is part of a comprehensive longitudinal care medical relationship regarding the patients primary diagnosis of MS and related concerns.   Angela Solomon A.  Vear, MD, PhD 10/29/2024, 7:49 PM Certified in Neurology, Clinical Neurophysiology, Sleep Medicine, Pain Medicine and Neuroimaging  [facility] [address], [address] [place], [place] [address] [phone]

## 2024-10-29 NOTE — Telephone Encounter (Signed)
 no auth required sent to [facility] [phone]

## 2024-11-04 ENCOUNTER — Other Ambulatory Visit: Payer: Self-pay | Admitting: Neurology

## 2024-11-04 MED ORDER — HYDROCODONE-ACETAMINOPHEN 10-325 MG PO TABS: ORAL_TABLET | ORAL | 0 refills | Status: DC

## 2024-11-04 NOTE — Telephone Encounter (Signed)
 Requested Prescriptions   Pending Prescriptions Disp Refills   HYDROcodone -acetaminophen  (NORCO) 10-325 MG tablet 90 tablet 0    Sig: Take 1 tablet by mouth three times daily as needed.   Last seen 10/29/24 Next appt 07/07/25 Dispenses   Dispensed Days Supply Quantity Provider Pharmacy  HYDROCODONE /ACETAMINOPHEN  10-325 T 10/08/2024 30 90 each Sater, Charlie LABOR, MD [facility] #...  HYDROCODONE /ACETAMINOPHEN  10-325 T 09/08/2024 30 90 each Sater, Charlie LABOR, MD [facility] STORE #...  HYDROCODONE /ACETAMINOPHEN  10-325 T 08/05/2024 30 90 each Sater, Charlie LABOR, MD [facility]ORE #...  HYDROCODONE /ACETAMINOPHEN  10-325 T 07/05/2024 30 90 each Sater, Charlie LABOR, MD [facility] STORE #...  HYDROCODONE /ACETAMINOPHEN  10-325 T 06/06/2024 30 90 each Sater, Charlie LABOR, MD [facility] DR[facility] STORE #...  HYDROCODONE /ACETAMINOPHEN  10-325 T 05/07/2024 30 90 each Sater, Charlie LABOR, MD [facility] STORE #...  HYDROCODONE /ACETAMINOPHEN  10-325 T 04/07/2024 30 90 each Sater, Charlie LABOR, MD [facility] DRUG STORE #...  HYDROCODONE /ACETAMINOPHEN  10-325 T 03/07/2024 30 90 each Sater, Charlie LABOR, MD [facility] DR[facility] STORE #...  HYDROCODONE /ACETAMINOPHEN  10-325 T 02/06/2024 30 90 each Sater, Charlie LABOR, MD [facility] STORE #...  HYDROCODONE /ACETAMINOPHEN  10-325 T 01/07/2024 30 90 each Sater, Charlie LABOR, MD [facility] DR[facility] STORE #...  HYDROCODONE /ACETAMINOPHEN  10-325 T 12/05/2023 30 90 each Sater, Charlie LABOR, MD [facility] DR[facility] STORE #...  HYDROCODONE /ACETAMINOPHEN  10-325 T 11/06/2023 30 90 each Sater, Charlie LABOR, MD [facility] STORE #.[place]

## 2024-11-04 NOTE — Telephone Encounter (Signed)
 Pt is needing a refill request for her HYDROcodone -acetaminophen  (NORCO) 10-325 MG tablet sent to the [facility] in [address]

## 2024-12-04 ENCOUNTER — Other Ambulatory Visit: Payer: Self-pay | Admitting: Neurology

## 2024-12-04 MED ORDER — HYDROCODONE-ACETAMINOPHEN 10-325 MG PO TABS: ORAL_TABLET | ORAL | 0 refills | Status: DC

## 2024-12-04 NOTE — Telephone Encounter (Signed)
 Pt is needing a refill on her HYDROcodone -acetaminophen  (NORCO) 10-325 MG tablet and is needing it sent to the [facility] on [address]

## 2024-12-04 NOTE — Telephone Encounter (Signed)
 Requested Prescriptions   Pending Prescriptions Disp Refills   HYDROcodone -acetaminophen  (NORCO) 10-325 MG tablet 90 tablet 0    Sig: Take 1 tablet by mouth three times daily as needed.   Last seen 10/29/24 Next appt 07/07/25 Dispenses   Dispensed Days Supply Quantity Provider Pharmacy  HYDROCODONE /ACETAMINOPHEN  10-325 T 11/05/2024 30 90 each Sater, Charlie LABOR, MD [facility] STORE #...  HYDROCODONE /ACETAMINOPHEN  10-325 T 10/08/2024 30 90 each Sater, Charlie LABOR, MD [facility] DR[facility] STORE #...  HYDROCODONE /ACETAMINOPHEN  10-325 T 09/08/2024 30 90 each Sater, Charlie LABOR, MD [facility] STORE #...  HYDROCODONE /ACETAMINOPHEN  10-325 T 08/05/2024 30 90 each Sater, Charlie LABOR, MD [facility] DR[facility] STORE #...  HYDROCODONE /ACETAMINOPHEN  10-325 T 07/05/2024 30 90 each Sater, Charlie LABOR, MD [facility] DRUG STORE #...  HYDROCODONE /ACETAMINOPHEN  10-325 T 06/06/2024 30 90 each Sater, Charlie LABOR, MD [facility] DRUG STORE #...  HYDROCODONE /ACETAMINOPHEN  10-325 T 05/07/2024 30 90 each Sater, Charlie LABOR, MD [facility] STORE #...  HYDROCODONE /ACETAMINOPHEN  10-325 T 04/07/2024 30 90 each Sater, Charlie LABOR, MD [facility] DRUG STORE #...  HYDROCODONE /ACETAMINOPHEN  10-325 T 03/07/2024 30 90 each Sater, Charlie LABOR, MD [facility] STORE #...  HYDROCODONE /ACETAMINOPHEN  10-325 T 02/06/2024 30 90 each Sater, Charlie LABOR, MD [facility] DRUG STORE #...  HYDROCODONE /ACETAMINOPHEN  10-325 T 01/07/2024 30 90 each Sater, Charlie LABOR, MD [facility] STORE #.[place]

## 2024-12-24 ENCOUNTER — Other Ambulatory Visit: Payer: Self-pay

## 2024-12-24 MED ORDER — QUETIAPINE FUMARATE 50 MG PO TABS: 50.0000 mg | ORAL_TABLET | Freq: Every day | ORAL | 5 refills | Status: AC

## 2025-01-02 ENCOUNTER — Other Ambulatory Visit: Payer: Self-pay | Admitting: Neurology

## 2025-01-02 MED ORDER — HYDROCODONE-ACETAMINOPHEN 10-325 MG PO TABS: ORAL_TABLET | ORAL | 0 refills | Status: AC

## 2025-01-02 NOTE — Telephone Encounter (Signed)
 Requested Prescriptions   Pending Prescriptions Disp Refills   HYDROcodone -acetaminophen  (NORCO) 10-325 MG tablet 90 tablet 0    Sig: Take 1 tablet by mouth three times daily as needed.   Last seen 10/29/24 Next appt 07/07/25 Dispenses   Dispensed Days Supply Quantity Provider Pharmacy  HYDROCODONE -ACETAMIN 10-325 MG 12/04/2024 30 90 each Sater, Charlie [facility]OR[phone] [facility]...  HYDROCODONE /ACETAMINOPHEN  10-325 T 11/05/2024 30 90 each Sater, Charlie LABOR, MD [facility] ST[facility] #...  HYDROCODONE /ACETAMINOPHEN  10-325 T 10/08/2024 30 90 each Sater, Charlie LABOR, MD [facility] DRUG STORE #...  HYDROCODONE /ACETAMINOPHEN  10-325 T 09/08/2024 30 90 each Sater, Charlie LABOR, MD [facility] DR[facility] STORE #...  HYDROCODONE /ACETAMINOPHEN  10-325 T 08/05/2024 30 90 each Sater, Charlie LABOR, MD [facility] DRUG STORE #...  HYDROCODONE /ACETAMINOPHEN  10-325 T 07/05/2024 30 90 each Sater, Charlie LABOR, MD [facility] DRUG STORE #...  HYDROCODONE /ACETAMINOPHEN  10-325 T 06/06/2024 30 90 each Sater, Charlie LABOR, MD [facility] DRUG STORE #...  HYDROCODONE /ACETAMINOPHEN  10-325 T 05/07/2024 30 90 each Sater, Charlie LABOR, MD [facility] DRUG STORE #...  HYDROCODONE /ACETAMINOPHEN  10-325 T 04/07/2024 30 90 each Sater, Charlie LABOR, MD [facility] DRUG STORE #...  HYDROCODONE /ACETAMINOPHEN  10-325 T 03/07/2024 30 90 each Sater, Charlie LABOR, MD [facility] DR[facility] STORE #...  HYDROCODONE /ACETAMINOPHEN  10-325 T 02/06/2024 30 90 each Sater, Charlie LABOR, MD [facility] DRUG STORE #...  HYDROCODONE /ACETAMINOPHEN  10-325 T 01/07/2024 30 90 each Sater, Charlie LABOR, MD [facility] DRUG STORE #.[place]

## 2025-01-02 NOTE — Telephone Encounter (Signed)
 Patient request refill for HYDROcodone -acetaminophen  (NORCO) 10-325 MG tablet send to [facility] #[id number]

## 2025-01-10 ENCOUNTER — Other Ambulatory Visit: Payer: Self-pay | Admitting: Neurology

## 2025-01-21 ENCOUNTER — Other Ambulatory Visit: Payer: Self-pay | Admitting: *Deleted

## 2025-01-21 MED ORDER — GABAPENTIN 800 MG PO TABS: ORAL_TABLET | ORAL | 6 refills | Status: AC

## 2025-01-21 NOTE — Telephone Encounter (Signed)
 Last seen on 10/29/24 Follow up scheduled on 07/07/25

## 2025-02-23 ENCOUNTER — Ambulatory Visit: Admitting: Neurology

## 2025-07-07 ENCOUNTER — Ambulatory Visit: Admitting: Neurology
